# Patient Record
Sex: Female | Born: 1935 | Race: White | Hispanic: No | Marital: Married | Smoking: Never smoker
Health system: Southern US, Community
[De-identification: ages and names within clinical notes are randomized; demographics above are authoritative.]

## PROBLEM LIST (undated history)

## (undated) DIAGNOSIS — C50912 Malignant neoplasm of unspecified site of left female breast: Secondary | ICD-10-CM

## (undated) DIAGNOSIS — C50919 Malignant neoplasm of unspecified site of unspecified female breast: Secondary | ICD-10-CM

## (undated) DIAGNOSIS — D0590 Unspecified type of carcinoma in situ of unspecified breast: Secondary | ICD-10-CM

## (undated) DIAGNOSIS — K219 Gastro-esophageal reflux disease without esophagitis: Secondary | ICD-10-CM

## (undated) DIAGNOSIS — C4492 Squamous cell carcinoma of skin, unspecified: Secondary | ICD-10-CM

## (undated) DIAGNOSIS — K449 Diaphragmatic hernia without obstruction or gangrene: Secondary | ICD-10-CM

## (undated) DIAGNOSIS — G4730 Sleep apnea, unspecified: Secondary | ICD-10-CM

## (undated) DIAGNOSIS — D649 Anemia, unspecified: Secondary | ICD-10-CM

## (undated) DIAGNOSIS — R600 Localized edema: Secondary | ICD-10-CM

## (undated) DIAGNOSIS — J302 Other seasonal allergic rhinitis: Secondary | ICD-10-CM

## (undated) DIAGNOSIS — M25569 Pain in unspecified knee: Secondary | ICD-10-CM

## (undated) DIAGNOSIS — Z9221 Personal history of antineoplastic chemotherapy: Secondary | ICD-10-CM

## (undated) DIAGNOSIS — C50911 Malignant neoplasm of unspecified site of right female breast: Secondary | ICD-10-CM

## (undated) DIAGNOSIS — H04203 Unspecified epiphora, bilateral lacrimal glands: Secondary | ICD-10-CM

## (undated) DIAGNOSIS — S3992XA Unspecified injury of lower back, initial encounter: Secondary | ICD-10-CM

## (undated) DIAGNOSIS — Z923 Personal history of irradiation: Secondary | ICD-10-CM

## (undated) HISTORY — DX: Pain in unspecified knee: M25.569

## (undated) HISTORY — DX: Unspecified injury of lower back, initial encounter: S39.92XA

## (undated) HISTORY — DX: Unspecified type of carcinoma in situ of unspecified breast: D05.90

## (undated) HISTORY — DX: Diaphragmatic hernia without obstruction or gangrene: K44.9

## (undated) HISTORY — DX: Gastro-esophageal reflux disease without esophagitis: K21.9

## (undated) HISTORY — DX: Anemia, unspecified: D64.9

## (undated) HISTORY — PX: COLONOSCOPY: SHX174

## (undated) HISTORY — PX: APPENDECTOMY: SHX54

## (undated) HISTORY — DX: Malignant neoplasm of unspecified site of unspecified female breast: C50.919

## (undated) HISTORY — DX: Other seasonal allergic rhinitis: J30.2

## (undated) HISTORY — DX: Sleep apnea, unspecified: G47.30

---

## 2005-03-18 ENCOUNTER — Ambulatory Visit: Payer: Self-pay | Admitting: Obstetrics and Gynecology

## 2005-07-25 ENCOUNTER — Ambulatory Visit: Payer: Self-pay | Admitting: Gastroenterology

## 2006-04-04 ENCOUNTER — Ambulatory Visit: Payer: Self-pay | Admitting: Obstetrics and Gynecology

## 2007-03-26 ENCOUNTER — Emergency Department: Payer: Self-pay | Admitting: Emergency Medicine

## 2007-03-29 ENCOUNTER — Emergency Department: Payer: Self-pay | Admitting: Unknown Physician Specialty

## 2007-04-02 ENCOUNTER — Emergency Department: Payer: Self-pay | Admitting: Emergency Medicine

## 2007-04-09 ENCOUNTER — Emergency Department: Payer: Self-pay | Admitting: Emergency Medicine

## 2007-04-23 ENCOUNTER — Emergency Department: Payer: Self-pay | Admitting: Emergency Medicine

## 2007-04-30 ENCOUNTER — Ambulatory Visit: Payer: Self-pay | Admitting: Obstetrics and Gynecology

## 2008-04-30 ENCOUNTER — Ambulatory Visit: Payer: Self-pay | Admitting: Obstetrics and Gynecology

## 2009-05-02 DIAGNOSIS — C4492 Squamous cell carcinoma of skin, unspecified: Secondary | ICD-10-CM

## 2009-05-02 HISTORY — DX: Squamous cell carcinoma of skin, unspecified: C44.92

## 2009-12-28 ENCOUNTER — Ambulatory Visit: Payer: Self-pay | Admitting: Family Medicine

## 2010-05-02 HISTORY — PX: CATARACT EXTRACTION, BILATERAL: SHX1313

## 2011-05-04 LAB — BASIC METABOLIC PANEL
Anion Gap: 10 (ref 7–16)
BUN: 20 mg/dL — ABNORMAL HIGH (ref 7–18)
Chloride: 107 mmol/L (ref 98–107)
Co2: 26 mmol/L (ref 21–32)
Creatinine: 0.74 mg/dL (ref 0.60–1.30)
EGFR (African American): >60
Potassium: 4.2 mmol/L (ref 3.5–5.1)

## 2011-05-04 LAB — CK TOTAL AND CKMB (NOT AT ARMC)
CK, Total: 94 U/L (ref 21–215)
CK-MB: 1.4 ng/mL (ref 0.5–3.6)

## 2011-05-04 LAB — CBC
MCHC: 30.3 g/dL — ABNORMAL LOW (ref 32.0–36.0)
Platelet: 357 10*3/uL (ref 150–440)
RDW: 18.1 % — ABNORMAL HIGH (ref 11.5–14.5)
WBC: 6.7 10*3/uL (ref 3.6–11.0)

## 2011-05-04 LAB — TROPONIN I: Troponin-I: <0.02 ng/mL

## 2011-05-05 ENCOUNTER — Inpatient Hospital Stay: Payer: Self-pay | Admitting: Internal Medicine

## 2011-05-05 LAB — CBC WITH DIFFERENTIAL/PLATELET
Basophil #: 0 10*3/uL (ref 0.0–0.1)
Basophil %: 0.1 %
Eosinophil #: 0.2 10*3/uL (ref 0.0–0.7)
Eosinophil %: 3 %
HCT: 26.4 % — ABNORMAL LOW (ref 35.0–47.0)
HGB: 8 g/dL — ABNORMAL LOW (ref 12.0–16.0)
Lymphocyte %: 40 %
MCH: 21.6 pg — ABNORMAL LOW (ref 26.0–34.0)
MCHC: 30.5 g/dL — ABNORMAL LOW (ref 32.0–36.0)
MCV: 71 fL — ABNORMAL LOW (ref 80–100)
Monocyte #: 0.8 10*3/uL — ABNORMAL HIGH (ref 0.0–0.7)
Neutrophil %: 45.5 %
RBC: 3.73 10*6/uL — ABNORMAL LOW (ref 3.80–5.20)

## 2011-05-05 LAB — BASIC METABOLIC PANEL
Anion Gap: 10 (ref 7–16)
Calcium, Total: 9 mg/dL (ref 8.5–10.1)
Chloride: 110 mmol/L — ABNORMAL HIGH (ref 98–107)
Creatinine: 0.79 mg/dL (ref 0.60–1.30)
EGFR (African American): >60
EGFR (Non-African Amer.): >60
Glucose: 121 mg/dL — ABNORMAL HIGH (ref 65–99)
Osmolality: 293 (ref 275–301)
Sodium: 145 mmol/L (ref 136–145)

## 2011-05-05 LAB — IRON AND TIBC
Iron Bind.Cap.(Total): 520 ug/dL — ABNORMAL HIGH (ref 250–450)
Iron Saturation: 3 %
Iron: 14 ug/dL — ABNORMAL LOW (ref 50–170)
Unbound Iron-Bind.Cap.: 506 ug/dL

## 2011-05-06 LAB — CBC WITH DIFFERENTIAL/PLATELET
Basophil #: 0 10*3/uL (ref 0.0–0.1)
HCT: 26.3 % — ABNORMAL LOW (ref 35.0–47.0)
Lymphocyte #: 3.5 10*3/uL (ref 1.0–3.6)
Lymphocyte %: 47.8 %
MCHC: 30.7 g/dL — ABNORMAL LOW (ref 32.0–36.0)
Monocyte #: 0.7 10*3/uL (ref 0.0–0.7)
Neutrophil %: 38.9 %
RDW: 19.2 % — ABNORMAL HIGH (ref 11.5–14.5)

## 2011-05-06 LAB — PROTIME-INR
INR: 1
Prothrombin Time: 12.8 secs (ref 11.5–14.7)

## 2011-05-24 ENCOUNTER — Ambulatory Visit: Payer: Self-pay | Admitting: Unknown Physician Specialty

## 2012-07-18 ENCOUNTER — Ambulatory Visit: Payer: Self-pay | Admitting: Obstetrics and Gynecology

## 2013-02-01 ENCOUNTER — Ambulatory Visit: Payer: Self-pay | Admitting: Specialist

## 2013-07-19 ENCOUNTER — Ambulatory Visit: Payer: Self-pay | Admitting: Obstetrics and Gynecology

## 2014-08-24 NOTE — Consult Note (Signed)
Pt with hugh hiatal hernia with erythematous coarse mucosa where the folds come together at the diaphragm, also superficial old gastritis streaks in antrum. Bx done Will go home and come to office Monday to schedule colonoscopy soon.  Soft diet recommended.  Electronic Signatures: Manya Silvas (MD)  (Signed on 04-Jan-13 16:16)  Authored  Last Updated: 04-Jan-13 16:16 by Manya Silvas (MD)

## 2014-08-24 NOTE — Consult Note (Signed)
PATIENT NAME:  Stephanie Crosby, Stephanie Crosby MR#:  [id number] DATE OF BIRTH:  09/20/1935  DATE OF CONSULTATION:  05/05/2011  REFERRING PHYSICIAN:  Dustin Flock, MD   CONSULTING PHYSICIAN: Gaylyn Cheers, MD/River Mckercher Jerelene Redden, ANP   REASON FOR CONSULTATION: Anemia with guaiac-positive stool.   HISTORY OF PRESENT ILLNESS: This 79 year old patient of Dr. Kary Kos has noted gradual worsening DOE over the last six months. It has become extremely symptomatic in the last few weeks, and the patient was found to have a hemoglobin of 11.01 Sep 2009, and a hemoglobin of 7.7 on 12/28 2012. She did have a stress test done yesterday, and repeat hemoglobin was 7.5 with iron deficiency.  She was sent to the hospital for a blood transfusion and further evaluation. The patient has not heard back yet from the stress test report but has noted no problems with chest pain. She reports dark brown stool occasionally, never black, or with melena. She has noted chronic mild heartburn easily managed with Prilosec. She has noted no progression of symptoms, difficulty swallowing, nausea, vomiting, abdominal pain or change in bowel habits. 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 She has consumed occasional Aleve for three days in a row and drinks wine with dinner. No history of ulcers. In the ER, the patient was found to have dark brown guaiac-positive stool. Prior GI studies have included a colonoscopy in 2007 for screening purposes, and it was totally normal. She had an upper endoscopy for heartburn in 2007 notable for a large hiatus hernia, normal esophagus, normal duodenum. She has been maintained on chronic Prilosec therapy. GI is asked to see the patient regarding further evaluation and management.   See addenum next page. Phone line disconnected during the dication.      ____________________________ Janalyn Harder Jerelene Redden, ANP kam:cbb D: 05/05/2011 15:08:26 ET T: 05/05/2011 15:50:53 ET JOB#: [id number]  cc: Joelene Millin A. Jerelene Redden, ANP, <Dictator> Janalyn Harder. Sherlyn Hay, MSN, ANP-BC Adult Nurse Practitioner ELECTRONICALLY SIGNED 05/05/2011 17:49

## 2014-08-24 NOTE — Discharge Summary (Signed)
PATIENT NAME:  Stephanie Crosby, Stephanie Crosby MR#:  [id number] DATE OF BIRTH:  03-02-1936  DATE OF ADMISSION:  05/05/2011 DATE OF DISCHARGE:  05/06/2011  DISCHARGE DIAGNOSES:  1. Iron deficiency anemia status post one unit of blood transfusion. 2. Gastroesophageal reflux disease.  3. Osteoarthritis. 4. Large hiatal hernia.   DISPOSITION: The patient is being discharged home.   DISCHARGE FOLLOWUP: She will followup with Dr. Gaylyn Cheers on Monday, 05/09/2011, around 10 o'clock  a.m.   DIET: Soft diet.   DISCHARGE MEDICATIONS:  1. Prilosec 20 mg twice a day. 2. Ferrous sulfate 325 mg three times daily.  PROCEDURES: Upper GI endoscopy: Large hiatal hernia with erythematous coarse mucosa within the folds as well as some gastritis in the antrum.   RESULTS: Hemoglobin 8 with microcytosis. Normal white count and platelet count. Essentially normal CMP. Iron studies consistent with iron deficiency anemia with low iron, increased iron binding capacity and low ferritin.   CONSULTANTS: Gaylyn Cheers, MD - Gastroenterology.  HOSPITAL COURSE: The patient is a 79 year old female with past medical history of gastroesophageal reflux disease, hiatal hernia, and osteoarthritis who was admitted with dyspnea on exertion. For additional details, please see the history of present illness. The patient was found to have symptomatic microcytic anemia. She received 1 unit of blood transfusion while in the hospital and her hemoglobin is 8.1 by the time of discharge. Since she was hemodynamically stable, she was not given any further transfusion. Iron studies were checked which were consistent with anemia of iron deficiency. She has been started on oral iron supplementation. She was guaiac positive, therefore, a Gastroenterology consultation was obtained. The patient had a prior EGD and endoscopy done in 2007, which were normal except for hiatal hernia. Dr. Vira Agar evaluated the patient and recommended inpatient EGD. The EGD  showed a large hiatal hernia with erythematous mucosa and gastritis in the antrum. The patient is going to be discharged home. She will take PPI and oral iron therapy. She will follow up with Dr. Vira Agar on Monday, 05/09/2011, for further plans for outpatient colonoscopy. The patient is being discharged home in stable condition.   TIME SPENT: 45 minutes.  ____________________________ Cherre Huger, MD sp:slb D: 05/06/2011 16:39:21 ET T: 05/09/2011 10:45:39 ET JOB#: [id number]  cc: Cherre Huger, MD, <Dictator> Manya Silvas, MD Cherre Huger MD ELECTRONICALLY SIGNED 05/09/2011 12:17

## 2014-08-24 NOTE — Consult Note (Signed)
PATIENT NAME:  Stephanie Crosby, Stephanie Crosby MR#:  [id number] DATE OF BIRTH:  January 25, 1936  DATE OF CONSULTATION:  05/05/2011  REFERRING PHYSICIAN:   CONSULTING PHYSICIAN:  Joelene Millin A. Jerelene Redden, ANP   PAST MEDICAL HISTORY:  1. Gastroesophageal reflux disease.  2. Osteoarthritis.  3. Seasonal allergies.  4. Fibrocystic breast disease.   PAST SURGICAL HISTORY:  1. Childbirth 1962. 2. Appendectomy in 1967.   MEDICATIONS:  1. Tylenol arthritis twice daily as needed.  2. Premarin vaginal cream once daily.  3. Nexium 40 mg daily.  4. Claritin-D 1 tablet daily.  5. Centrum Silver 1 tablet daily.  6. Calcium 600 mg twice daily. 7.  Aleve bid up to three days as needed-not daily use   ALLERGIES: Penicillin.   HABITS: Negative tobacco, alcohol, or illicit drug use.   FAMILY HISTORY: Maternal aunt with colon cancer, elderly. No first-degree relative with colorectal polyp or neoplasm.   REVIEW OF SYSTEMS: 10 systems reviewed negative with the exception of progressive dyspnea on exertion, slight ankle swelling. Stress results available and normal.  PHYSICAL EXAMINATION:  VITAL SIGNS: Temperature 97.9, pulse 84, respirations 18, blood pressure 156/79, 93% on room air.   HEENT: Head is normocephalic. Conjunctivae pale pink. Sclerae anicteric. Oral mucosa moist, intact.   NECK: Supple without lymphadenopathy, thyromegaly.   HEART: Heart tones S1, S2 without murmur or gallop.   LUNGS: Clear to auscultation. Respirations are eupneic.   ABDOMEN: Soft, nontender, normal bowel sounds throughout.   RECTAL: Deferred as it was obtained in the ER, reported heme positive stool.   EXTREMITIES: Without edema, cyanosis, clubbing.   NEUROLOGIC: Awake, alert, oriented x3. Range of motion is normal.   PSYCH: Affect and mood within normal.   SKIN: Warm and dry without rash.   LABORATORY, DIAGNOSTIC AND RADIOLOGICAL DATA: Admission blood work: Glucose 103, BUN 20, creatinine 0.74, serum iron is low at 14, TIBC  elevated 520, ferritin low 4, iron saturation 3%, calcium 9.5. CPK total and troponin negative. Hemoglobin 8.0, hematocrit 26.3, platelet count 357, MCV 69, MCH 21, RDW 18.1.   Repeat laboratory studies: Glucose 121, BUN 20, creatinine 0.79, hemoglobin 8, hematocrit 26.4 per labs 05/05/2011 at 02:13.   IMPRESSION: Progressive dyspnea on exertion, found to have severely low hemoglobin and heme-positive stool with Iron deficiency. Patient received 1 unit PRBC, feels better. Conjuctiva pink. Negative stresss test yesterday. Some Aleve use, and almost daily glass of wine with dinner.  No GI complaints. History of chronic heartburn, large hiatal hernia per EGD 2007 and she has been maintained on chronic Nexium therapy.    PLAN:  1. A luminal evaluation per Dr. Vira Agar. He advises  EGD in the morning to r/o ulcer/NSAID inducded gastritis/duodenitis/Cameron erosions from [facility]. If negatice, colonoscopy can be done as outpatient. Capsule endoscopy if colonoscopy is also unremarkable.  2. Would avoid oral iron at this time so we can do studies. Would repeat CBC to check response of 1 unit PRBC. Will also check PT/INR, and PTT for baseline stat in am  3. Continue with b.i.d. proton pump inhibitor therapy. Would hold off diet until plans for luminal evaluation arranged. OK to have full liquids this eve.  4. Case d/w Dr. Vira Agar and he saw patient and directed care.   These services provided by Joelene Millin A. Jerelene Redden, MS, APRN, BC, ANP under collaborative agreement with Dr. Gaylyn Cheers.   ____________________________ Janalyn Harder. Jerelene Redden, ANP kam:cms D: 05/05/2011 15:16:31 ET T: 05/05/2011 16:03:48 ET JOB#: [id number]  cc: Joelene Millin A. Jerelene Redden, ANP, <Dictator> Joelene Millin  A. Sherlyn Hay, MSN, ANP-BC Adult Nurse Practitioner ELECTRONICALLY SIGNED 05/05/2011 17:48

## 2014-08-24 NOTE — H&P (Signed)
PATIENT NAME:  Stephanie Crosby, Stephanie Crosby MR#:  [id number] DATE OF BIRTH:  03/30/1936  DATE OF ADMISSION:  05/04/2011  PRIMARY CARE PHYSICIAN: [place] Pink, MD  CHIEF COMPLAINT:  Dyspnea on exertion with anemia.   HISTORY OF PRESENT ILLNESS: The patient is a 79 year old white female who has history of gastroesophageal reflux disease and osteoarthritis who has been having shortness of breath with exertion. Therefore she was referred to [facility]. She had a stress test today. In the meantime, she had some blood work drawn by her primary MD, by Dr. Kary Kos, which showed her hemoglobin was low so they recommended she have her hemoglobin rechecked while she was having her stress test. She had her hemoglobin checked and it was 7.5. Therefore, she was sent to the ED for further evaluation. In the ED, her hemoglobin was 8. She had a guaiac stool that was checked which was positive and according to the ED physician was dark in color. The patient reports that she has occasional dark color stools depending on what she eats, but she has not noticed any gross blood. She denies any hematemesis, no hematochezia per her recollection. She denies any hematuria or hemoptysis. She does report history of having low iron and being mildly anemic, but she states that her hemoglobin has never been this low. She had an EGD and a colonoscopy done in 2007 which showed no abnormality. The patient does occasionally take Aleve from time to time, not on a daily basis. She otherwise denies any BC Powders or NSAID use. She denies any abdominal pain, nausea, or vomiting. She denies any weight loss or change in appetite. The dyspnea on exertion occurs especially when she climbs up flights of stairs. She has no other symptoms of chest pain.   PAST MEDICAL HISTORY:  1. Gastroesophageal reflux disease.  2. Osteoarthritis.  3. Status post bilateral cataract surgery.  4. Status post appendectomy.   ALLERGIES: Penicillin.       CURRENT MEDICATIONS:  1. Tylenol Arthritis 1 tab p.o. twice a day as needed.  2. Premarin vaginal cream once daily.  3. Nexium 40 mg daily.  4. Claritin-D 1 tab p.o. daily.  5. Centrum Silver 1 tab p.o. daily.  6. Calci-Tab 600 mg 1 tab p.o. twice a day.  SOCIAL HISTORY: She does not drink or smoke, no drugs.   FAMILY HISTORY: Mother and sister with colon cancer.  REVIEW OF SYSTEMS: CONSTITUTIONAL: Denies any fevers. Complains of fatigue and weakness. No pain. No weight loss. No weight gain. EYES: No blurred or double vision. No pain. No redness. No inflammation. No glaucoma. History of cataracts. ENT: No tinnitus. No ear pain. No hearing loss. No allergies, seasonal or year-round. No nasal discharge. No snoring. No postnasal drip. No sinus pain. RESPIRATORY: No cough. No wheezing. No hemoptysis. No dyspnea. No chronic obstructive pulmonary disease. No tuberculosis. No pneumonia. CARDIOVASCULAR: No chest pain. No orthopnea. No edema. No arrhythmia. GI: No nausea. No vomiting. No diarrhea. No abdominal pain. No hematemesis. No melena. No ulcer. Has history of gastroesophageal reflux disease. No irritable bowel syndrome.  No jaundice. No rectal bleeding. No changes in bowel habits. GENITOURINARY: Denies any dysuria, hematuria, renal calculus, frequency, or incontinence. ENDOCRINE: Denies any polydipsia, nocturia, or thyroid problems. HEME/LYMPH: Reports history of mild anemia. She is not sure what her hemoglobin was. No easy bruising, bleeding, or swollen glands. SKIN: No acne. No rash. No change in mole, hair or skin. MUSCULOSKELETAL: Denies pain in the neck, back, or shoulder. NEUROLOGIC:  No numbness. No weakness. No dysarthria. No cerebrovascular accident. No transient ischemic attack. PSYCHIATRIC: No anxiety. No insomnia. No ADD. No OCD.   PHYSICAL EXAMINATION:   VITAL SIGNS: Temperature 98.2, pulse 80, respiratory rate 18, blood pressure 150/74, and oxygen saturation 97% on room air.    GENERAL: The patient is a 79 year old well developed, well nourished female in no acute distress.   HEENT: Head atraumatic, normocephalic. Pupils are equally round and reactive to light and accommodation. Extraocular movements intact. No scleral icterus. No conjunctival pallor. Oropharynx is clear without any exudates.   NECK: No thyromegaly. No carotid bruits.   HEART: Regular rate and rhythm. No murmurs, rubs, clicks, or gallops.   LUNGS: Clear to auscultation bilaterally without any rales, rhonchi, or wheezing.   ABDOMEN: Soft, nontender, and nondistended. Positive bowel sounds x4.   EXTREMITIES: No clubbing, cyanosis, or edema.   NEUROLOGIC: Awake, alert, and oriented x3. No focal deficits.   SKIN: There is no rash.   LYMPHATICS: No lymph nodes are palpable.   MUSCULOSKELETAL: There is no erythema or swelling.   PSYCHIATRIC: She is not anxious or depressed.   LABS: Glucose 103, BUN 20, creatinine 0.74, sodium 143, potassium 4.2, chloride 107, CO2 26, and calcium 9.5. WBC 6.7, hemoglobin 8.0, platelet count 357, and MCV is 69 here.   ASSESSMENT AND PLAN: The patient is a 79 year old white female with history of osteoarthritis and gastroesophageal reflux disease who has been having shortness of breath with exertion for the past 2 to 3 weeks.  1. Symptomatic microcytic anemia, possibly due to gastrointestinal blood loss with guaiac-positive stools: At this time, transfusion has been ordered per the ED physician. The patient has been explained the risks and benefits and has consented. She understands that getting the transfusion will make her shortness of breath improve. We will also have Gastroenterology evaluate the patient for possible esophagogastroduodenoscopy and colonoscopy. Currently she is stable. If her hemoglobin stay stable, it may be done as an outpatient. I will place her on Protonix twice a day. Follow her CBC in the morning. I will try to get iron and ferritin level.   2. Gastroesophageal reflux disease: Proton pump inhibitors twice a day.  3. Osteoarthritis: Tylenol p.r.n.  4. CODE STATUS: FULL CODE. The patient is ambulatory. In light of gastrointestinal bleed, we will not place her on any heparin or Lovenox.   TIME SPENT: 35 minutes. ____________________________ Lafonda Mosses Posey Pronto, MD shp:slb D: 05/04/2011 21:33:35 ET T: 05/05/2011 08:12:40 ET JOB#: [id number]  cc: Thaer Miyoshi H. Posey Pronto, MD, <Dictator> Irven Easterly. Kary Kos, MD Alric Seton MD ELECTRONICALLY SIGNED 05/07/2011 15:24

## 2014-09-23 ENCOUNTER — Emergency Department
Admission: EM | Admit: 2014-09-23 | Discharge: 2014-09-24 | Disposition: A | Discharge status: Home / Self Care | Payer: Medicare Other | Attending: Emergency Medicine | Admitting: Emergency Medicine

## 2014-09-23 ENCOUNTER — Emergency Department: Payer: Medicare Other

## 2014-09-23 ENCOUNTER — Encounter: Payer: Self-pay | Admitting: Emergency Medicine

## 2014-09-23 DIAGNOSIS — Y93E1 Activity, personal bathing and showering: Secondary | ICD-10-CM | POA: Diagnosis not present

## 2014-09-23 DIAGNOSIS — Y9289 Other specified places as the place of occurrence of the external cause: Secondary | ICD-10-CM | POA: Insufficient documentation

## 2014-09-23 DIAGNOSIS — Y998 Other external cause status: Secondary | ICD-10-CM | POA: Diagnosis not present

## 2014-09-23 DIAGNOSIS — S20229A Contusion of unspecified back wall of thorax, initial encounter: Secondary | ICD-10-CM | POA: Diagnosis not present

## 2014-09-23 DIAGNOSIS — W182XXA Fall in (into) shower or empty bathtub, initial encounter: Secondary | ICD-10-CM | POA: Diagnosis not present

## 2014-09-23 DIAGNOSIS — S3992XA Unspecified injury of lower back, initial encounter: Secondary | ICD-10-CM | POA: Diagnosis present

## 2014-09-23 MED ORDER — OXYCODONE-ACETAMINOPHEN 5-325 MG PO TABS
1.0000 | ORAL_TABLET | Freq: Once | ORAL | Status: AC
Start: 2014-09-23 — End: 2014-09-23
  Administered 2014-09-23: 1 via ORAL

## 2014-09-23 MED ORDER — OXYCODONE-ACETAMINOPHEN 5-325 MG PO TABS
ORAL_TABLET | ORAL | Status: AC
  Administered 2014-09-23: 1 via ORAL
  Filled 2014-09-23: qty 1

## 2014-09-23 NOTE — ED Provider Notes (Signed)
[facility] Emergency Department Provider Note  ____________________________________________  Time seen: 11:15 PM  I have reviewed the triage vital signs and the nursing notes.   HISTORY  Chief Complaint Fall      HPI Stephanie Crosby is a 79 y.o. female presents with history of slip and fall while in shower tonight. Patient currently complains of 8 out of 10 sharp mid upper back pain. Patient states she struck that portion of her back in the shower when she fell. Patient denies any head injury no consciousness. Pain is aggravated with movement improved with remaining still.     History reviewed. No pertinent past medical history.  There are no active problems to display for this patient.   Past Surgical History  Procedure Laterality Date  . Appendectomy      No current outpatient prescriptions on file.  Allergies Review of patient's allergies indicates not on file.  History reviewed. No pertinent family history.  Social History History  Substance Use Topics  . Smoking status: Never Smoker   . Smokeless tobacco: Never Used  . Alcohol Use: Yes     Comment: occasional drinker    Review of Systems  Constitutional: Negative for fever. Eyes: Negative for visual changes. ENT: Negative for sore throat. Cardiovascular: Negative for chest pain. Respiratory: Negative for shortness of breath. Gastrointestinal: Negative for abdominal pain, vomiting and diarrhea. Genitourinary: Negative for dysuria. Musculoskeletal: Positive for back pain. Skin: Negative for rash. Neurological: Negative for headaches, focal weakness or numbness.   10-point ROS otherwise negative.  ____________________________________________   PHYSICAL EXAM:  VITAL SIGNS: ED Triage Vitals  Enc Vitals Group     BP 09/23/14 2318 165/99 mmHg     Pulse Rate 09/23/14 2318 98     Resp 09/23/14 2318 20     Temp 09/23/14 2318 97 F (36.1 C)     Temp Source 09/23/14 2318  Oral     SpO2 09/23/14 2318 95 %     Weight 09/23/14 2318 165 lb (74.844 kg)     Height 09/23/14 2318 5\' 7"  (1.702 m)     Head Cir --      Peak Flow --      Pain Score 09/23/14 2319 8     Pain Loc --      Pain Edu? --      Excl. in [place]? --      Constitutional: Alert and oriented. Well appearing and in no distress. Eyes: Conjunctivae are normal. PERRL. Normal extraocular movements. ENT   Head: Normocephalic and atraumatic.   Nose: No congestion/rhinnorhea. Cardiovascular: Normal rate, regular rhythm. Normal and symmetric distal pulses are present in all extremities. No murmurs, rubs, or gallops. Respiratory: Normal respiratory effort without tachypnea nor retractions. Breath sounds are clear and equal bilaterally. No wheezes/rales/rhonchi. Gastrointestinal: Soft and nontender. No distention. There is no CVA tenderness. Genitourinary: deferred Musculoskeletal: Nontender with normal range of motion in all extremities. No joint effusions.  No lower extremity tenderness nor edema. Pain with palpation of T4 and T5. Neurologic:  Normal speech and language. No gross focal neurologic deficits are appreciated. Speech is normal.  Skin:  Skin is warm, dry and intact. No rash noted. Psychiatric: Mood and affect are normal. Speech and behavior are normal. Patient exhibits appropriate insight and judgment.  ____________________________________________        RADIOLOGY  Thoracic spine x-ray negative  ____________________________________________     INITIAL IMPRESSION / ASSESSMENT AND PLAN / ED COURSE  Pertinent labs & imaging  results that were available during my care of the patient were reviewed by me and considered in my medical decision making (see chart for details).  Strip physical exam consistent with upper middle back contusion.  ____________________________________________   FINAL CLINICAL IMPRESSION(S) / ED DIAGNOSES  Final diagnoses:  Back contusion, unspecified  laterality, initial encounter      Gregor Hams, MD 09/24/14 2321

## 2014-09-23 NOTE — ED Notes (Addendum)
Pt via ems from home after falling in the shower. Pt states she did not pass out, that she was moving and lost her balance. Pt states she hit her back on the shower seat. Rates pain 8/10. Pt alert & oriented with warm, dry skin.

## 2014-09-24 MED ORDER — OXYCODONE-ACETAMINOPHEN 5-325 MG PO TABS: 1.0000 | ORAL_TABLET | ORAL | Status: DC | PRN

## 2014-09-24 NOTE — Discharge Instructions (Signed)
Contusion °A contusion is a deep bruise. Contusions are the result of an injury that caused bleeding under the skin. The contusion may turn blue, purple, or yellow. Minor injuries will give you a painless contusion, but more severe contusions may stay painful and swollen for a few weeks.  °CAUSES  °A contusion is usually caused by a blow, trauma, or direct force to an area of the body. °SYMPTOMS  °· Swelling and redness of the injured area. °· Bruising of the injured area. °· Tenderness and soreness of the injured area. °· Pain. °DIAGNOSIS  °The diagnosis can be made by taking a history and physical exam. An X-ray, CT scan, or MRI may be needed to determine if there were any associated injuries, such as fractures. °TREATMENT  °Specific treatment will depend on what area of the body was injured. In general, the best treatment for a contusion is resting, icing, elevating, and applying cold compresses to the injured area. Over-the-counter medicines may also be recommended for pain control. Ask your caregiver what the best treatment is for your contusion. °HOME CARE INSTRUCTIONS  °· Put ice on the injured area. °¨ Put ice in a plastic bag. °¨ Place a towel between your skin and the bag. °¨ Leave the ice on for 15-20 minutes, 3-4 times a day, or as directed by your health care provider. °· Only take over-the-counter or prescription medicines for pain, discomfort, or fever as directed by your caregiver. Your caregiver may recommend avoiding anti-inflammatory medicines (aspirin, ibuprofen, and naproxen) for 48 hours because these medicines may increase bruising. °· Rest the injured area. °· If possible, elevate the injured area to reduce swelling. °SEEK IMMEDIATE MEDICAL CARE IF:  °· You have increased bruising or swelling. °· You have pain that is getting worse. °· Your swelling or pain is not relieved with medicines. °MAKE SURE YOU:  °· Understand these instructions. °· Will watch your condition. °· Will get help right  away if you are not doing well or get worse. °Document Released: 01/26/2005 Document Revised: 04/23/2013 Document Reviewed: 02/21/2011 °Exit[employer]® Patient Information ©2015 [employer]. This information is not intended to replace advice given to you by your health care provider. Make sure you discuss any questions you have with your health care provider. ° °

## 2014-10-14 DIAGNOSIS — M47816 Spondylosis without myelopathy or radiculopathy, lumbar region: Secondary | ICD-10-CM | POA: Insufficient documentation

## 2014-10-14 DIAGNOSIS — S239XXA Sprain of unspecified parts of thorax, initial encounter: Secondary | ICD-10-CM | POA: Insufficient documentation

## 2015-03-23 ENCOUNTER — Other Ambulatory Visit: Payer: Self-pay | Admitting: Family Medicine

## 2015-03-23 DIAGNOSIS — Z1231 Encounter for screening mammogram for malignant neoplasm of breast: Secondary | ICD-10-CM

## 2015-04-07 ENCOUNTER — Ambulatory Visit
Admission: RE | Admit: 2015-04-07 | Discharge: 2015-04-07 | Disposition: A | Discharge status: Home / Self Care | Payer: Medicare Other | Source: Ambulatory Visit | Attending: Family Medicine | Admitting: Family Medicine

## 2015-04-07 ENCOUNTER — Other Ambulatory Visit: Payer: Self-pay | Admitting: Family Medicine

## 2015-04-07 DIAGNOSIS — Z1231 Encounter for screening mammogram for malignant neoplasm of breast: Secondary | ICD-10-CM | POA: Diagnosis present

## 2015-04-09 ENCOUNTER — Other Ambulatory Visit: Payer: Self-pay | Admitting: Family Medicine

## 2015-04-09 DIAGNOSIS — R928 Other abnormal and inconclusive findings on diagnostic imaging of breast: Secondary | ICD-10-CM

## 2015-04-15 ENCOUNTER — Other Ambulatory Visit: Payer: Self-pay | Admitting: Family Medicine

## 2015-04-15 DIAGNOSIS — R928 Other abnormal and inconclusive findings on diagnostic imaging of breast: Secondary | ICD-10-CM

## 2015-04-17 ENCOUNTER — Other Ambulatory Visit: Payer: Medicare Other

## 2015-04-17 ENCOUNTER — Ambulatory Visit: Payer: Medicare Other

## 2015-04-28 ENCOUNTER — Ambulatory Visit
Admission: RE | Admit: 2015-04-28 | Discharge: 2015-04-28 | Disposition: A | Discharge status: Home / Self Care | Payer: Medicare Other | Source: Ambulatory Visit | Attending: Family Medicine | Admitting: Family Medicine

## 2015-04-28 DIAGNOSIS — R928 Other abnormal and inconclusive findings on diagnostic imaging of breast: Secondary | ICD-10-CM

## 2015-04-28 DIAGNOSIS — N63 Unspecified lump in breast: Secondary | ICD-10-CM | POA: Insufficient documentation

## 2015-04-29 ENCOUNTER — Other Ambulatory Visit: Payer: Self-pay | Admitting: Family Medicine

## 2015-04-29 DIAGNOSIS — N63 Unspecified lump in unspecified breast: Secondary | ICD-10-CM

## 2015-05-03 DIAGNOSIS — C50919 Malignant neoplasm of unspecified site of unspecified female breast: Secondary | ICD-10-CM

## 2015-05-03 DIAGNOSIS — C50911 Malignant neoplasm of unspecified site of right female breast: Secondary | ICD-10-CM

## 2015-05-03 DIAGNOSIS — C50912 Malignant neoplasm of unspecified site of left female breast: Secondary | ICD-10-CM

## 2015-05-03 DIAGNOSIS — D0590 Unspecified type of carcinoma in situ of unspecified breast: Secondary | ICD-10-CM

## 2015-05-03 HISTORY — DX: Malignant neoplasm of unspecified site of unspecified female breast: C50.919

## 2015-05-03 HISTORY — PX: BREAST LUMPECTOMY: SHX2

## 2015-05-03 HISTORY — DX: Malignant neoplasm of unspecified site of left female breast: C50.912

## 2015-05-03 HISTORY — DX: Unspecified type of carcinoma in situ of unspecified breast: D05.90

## 2015-05-03 HISTORY — DX: Malignant neoplasm of unspecified site of right female breast: C50.911

## 2015-05-06 ENCOUNTER — Ambulatory Visit
Admission: RE | Admit: 2015-05-06 | Discharge: 2015-05-06 | Disposition: A | Discharge status: Home / Self Care | Payer: Medicare Other | Source: Ambulatory Visit | Attending: Family Medicine | Admitting: Family Medicine

## 2015-05-06 DIAGNOSIS — N63 Unspecified lump in unspecified breast: Secondary | ICD-10-CM

## 2015-05-08 LAB — SURGICAL PATHOLOGY

## 2015-05-12 ENCOUNTER — Inpatient Hospital Stay: Payer: Medicare Other | Attending: Oncology | Admitting: Oncology

## 2015-05-12 ENCOUNTER — Other Ambulatory Visit: Payer: Self-pay | Admitting: Oncology

## 2015-05-12 ENCOUNTER — Encounter: Payer: Self-pay | Admitting: *Deleted

## 2015-05-12 VITALS — BP 161/110 | HR 82 | Temp 97.8°F | Resp 18 | Ht 66.54 in | Wt 164.7 lb

## 2015-05-12 DIAGNOSIS — M899 Disorder of bone, unspecified: Secondary | ICD-10-CM | POA: Diagnosis not present

## 2015-05-12 DIAGNOSIS — N6489 Other specified disorders of breast: Secondary | ICD-10-CM | POA: Insufficient documentation

## 2015-05-12 DIAGNOSIS — C50412 Malignant neoplasm of upper-outer quadrant of left female breast: Secondary | ICD-10-CM | POA: Insufficient documentation

## 2015-05-12 DIAGNOSIS — K760 Fatty (change of) liver, not elsewhere classified: Secondary | ICD-10-CM | POA: Insufficient documentation

## 2015-05-12 DIAGNOSIS — E042 Nontoxic multinodular goiter: Secondary | ICD-10-CM | POA: Diagnosis not present

## 2015-05-12 DIAGNOSIS — R030 Elevated blood-pressure reading, without diagnosis of hypertension: Secondary | ICD-10-CM

## 2015-05-12 DIAGNOSIS — K449 Diaphragmatic hernia without obstruction or gangrene: Secondary | ICD-10-CM | POA: Diagnosis not present

## 2015-05-12 DIAGNOSIS — M549 Dorsalgia, unspecified: Secondary | ICD-10-CM | POA: Insufficient documentation

## 2015-05-12 DIAGNOSIS — M4854XA Collapsed vertebra, not elsewhere classified, thoracic region, initial encounter for fracture: Secondary | ICD-10-CM | POA: Diagnosis not present

## 2015-05-12 DIAGNOSIS — C50912 Malignant neoplasm of unspecified site of left female breast: Secondary | ICD-10-CM

## 2015-05-12 DIAGNOSIS — Z170 Estrogen receptor positive status [ER+]: Secondary | ICD-10-CM | POA: Diagnosis not present

## 2015-05-12 DIAGNOSIS — N281 Cyst of kidney, acquired: Secondary | ICD-10-CM | POA: Insufficient documentation

## 2015-05-12 DIAGNOSIS — Z79899 Other long term (current) drug therapy: Secondary | ICD-10-CM | POA: Diagnosis not present

## 2015-05-12 DIAGNOSIS — F419 Anxiety disorder, unspecified: Secondary | ICD-10-CM | POA: Diagnosis not present

## 2015-05-12 DIAGNOSIS — R911 Solitary pulmonary nodule: Secondary | ICD-10-CM | POA: Insufficient documentation

## 2015-05-12 DIAGNOSIS — Z85828 Personal history of other malignant neoplasm of skin: Secondary | ICD-10-CM

## 2015-05-12 NOTE — Patient Instructions (Signed)

## 2015-05-12 NOTE — Progress Notes (Signed)
  Oncology Nurse Navigator Documentation  Navigator Location: [facility]Med Onc (05/12/15 1300) Navigator Encounter Type: Initial MedOnc (05/12/15 1300)   Abnormal Finding Date: 04/28/15 (05/12/15 1300) Confirmed Diagnosis Date: 05/07/15 (05/12/15 1300)     Patient Visit Type: MedOnc (05/12/15 1300)   Barriers/Navigation Needs: Education (05/12/15 1300) Education: Newly Diagnosed Cancer Education (05/12/15 1300)    Met patient and her husband today during her initial medical oncology consultation. Gave patient breast cancer educational literature, "My Breast Cancer Treatment Handbook" by Josephine Igo, RN.  Reviewed port a cath information.  Offered support.  They are to call if they have any questions or needs, or if they do not have a surgical appointment by Friday.                    Time Spent with Patient: 90 (05/12/15 1300)

## 2015-05-12 NOTE — Progress Notes (Signed)
Patient is anxious today and her BP is elevated.  She reports that her bp is borderline high and increases more at doctor appointments.

## 2015-05-13 NOTE — Progress Notes (Signed)
[place]  [phone]) [phone] Fax:(336) [phone]  ID: [name] OB: 1935/10/26  MR#: [id number]  KZL#:[id number]  [name], MD as PCP - General (Family Medicine)  CHIEF COMPLAINT:  Chief Complaint  Patient presents with  . New Evaluation    abnormal mammogram and biopsy    INTERVAL HISTORY: Patient is a 80 year old female who was noted to have an abnormality on routine screening mammogram. Subsequent biopsy confirmed invasive lobular carcinoma. Currently, she is highly anxious but otherwise feels well. She has no neurologic complaints. She denies any pain. She has a good appetite and denies weight loss. She has no chest pain or shortness of breath. She denies any nausea, vomiting, constipation, or diarrhea. She has no urinary complaints. Patient otherwise feels well and offers no further specific complaints.  REVIEW OF SYSTEMS:   Review of Systems  Constitutional: Negative for fever, weight loss and malaise/fatigue.  Respiratory: Negative.   Cardiovascular: Negative.   Musculoskeletal: Negative.   Neurological: Negative.   Psychiatric/Behavioral: The patient is nervous/anxious.     As per HPI. Otherwise, a complete review of systems is negatve.  PAST MEDICAL HISTORY: Past Medical History  Diagnosis Date  . Skin cancer 2011    PAST SURGICAL HISTORY: Past Surgical History  Procedure Laterality Date  . Appendectomy      FAMILY HISTORY: Patient reports a maternal aunt with breast cancer as well as a female cousin with breast cancer.  Family History  Problem Relation Age of Onset  . Breast cancer Neg Hx        ADVANCED DIRECTIVES:    HEALTH MAINTENANCE: Social History  Substance Use Topics  . Smoking status: Never Smoker   . Smokeless tobacco: Never Used  . Alcohol Use: Yes     Comment: occasional drinker     Colonoscopy:  PAP:  Bone density:  Lipid panel:  Allergies  Allergen Reactions  .  Penicillins Rash    Current Outpatient Prescriptions  Medication Sig Dispense Refill  . CALCIUM PO Take by mouth.    . ferrous fumarate (HEMOCYTE - 106 MG FE) 325 (106 Fe) MG TABS tablet Take 1 tablet by mouth.    . Multiple Vitamins-Minerals (CENTRUM SILVER PO) Take by mouth.    Marland Kitchen omeprazole (PRILOSEC) 20 MG capsule Take 1 capsule by mouth daily.    . valACYclovir (VALTREX) 500 MG tablet Take by mouth.    . oxyCODONE-acetaminophen (PERCOCET/ROXICET) 5-325 MG per tablet Take 1 tablet by mouth every 4 (four) hours as needed for severe pain. (Patient not taking: Reported on 05/12/2015) 20 tablet 0   No current facility-administered medications for this visit.    OBJECTIVE: Filed Vitals:   05/12/15 1021  BP: 161/110  Pulse: 82  Temp: 97.8 F (36.6 C)  Resp: 18     Body mass index is 26.15 kg/(m^2).    ECOG FS:0 - Asymptomatic  General: Well-developed, well-nourished, no acute distress. Eyes: Pink conjunctiva, anicteric sclera. HEENT: Normocephalic, moist mucous membranes, clear oropharnyx. Breasts: Patient requested exam be deferred today. Lungs: Clear to auscultation bilaterally. Heart: Regular rate and rhythm. No rubs, murmurs, or gallops. Abdomen: Soft, nontender, nondistended. No organomegaly noted, normoactive bowel sounds. Musculoskeletal: No edema, cyanosis, or clubbing. Neuro: Alert, answering all questions appropriately. Cranial nerves grossly intact. Skin: No rashes or petechiae noted. Psych: Normal affect. Lymphatics: No cervical, calvicular, axillary or inguinal LAD.   LAB RESULTS:  Lab Results  Component Value Date   NA 145 05/05/2011   K  4.0 05/05/2011   CL 110* 05/05/2011   CO2 25 05/05/2011   GLUCOSE 121* 05/05/2011   BUN 20* 05/05/2011   CREATININE 0.79 05/05/2011   CALCIUM 9.0 05/05/2011   GFRNONAA >60 05/05/2011   GFRAA >60 05/05/2011    Lab Results  Component Value Date   WBC 7.3 05/06/2011   NEUTROABS 2.8 05/06/2011   HGB 8.1* 05/06/2011    HCT 26.3* 05/06/2011   MCV 71* 05/06/2011   PLT 280 05/06/2011     STUDIES: Mm Digital Diagnostic Unilat L  05/06/2015  CLINICAL DATA:  Patient status post ultrasound-guided core needle biopsy left breast mass. EXAM: DIAGNOSTIC LEFT MAMMOGRAM POST ULTRASOUND BIOPSY COMPARISON:  Previous exam(s). FINDINGS: Mammographic images were obtained following ultrasound guided biopsy of left breast mass, 2 o'clock position. Wing shaped marking clip in appropriate position IMPRESSION: Appropriate position wing shaped marking clip status post ultrasound-guided core needle biopsy. Final Assessment: Post Procedure Mammograms for Marker Placement Electronically Signed   By: Lovey Newcomer M.D.   On: 05/06/2015 13:41   US Breast Ltd Uni Left Inc Axilla  04/28/2015  CLINICAL DATA:  Patient recalled from screening for left breast architectural distortion. EXAM: DIGITAL DIAGNOSTIC LEFT MAMMOGRAM WITH 3D TOMOSYNTHESIS ULTRASOUND LEFT BREAST COMPARISON:  Previous exam(s). ACR Breast Density Category c: The breast tissue is heterogeneously dense, which may obscure small masses. FINDINGS: Within the upper-outer left breast middle depth there is persistent architectural distortion identified on spot compression CC and MLO tomosynthesis images. On physical exam, I palpate no discrete mass within the upper-outer left breast. Targeted ultrasound is performed, showing a 1.3 x 0.8 x 0.6 cm irregular hypoechoic mass with associated architectural distortion within the left breast 2 o'clock position 2 cm from the nipple, corresponding with mammographic abnormality. There is internal color vascular flow. No left axillary adenopathy identified. IMPRESSION: Suspicious left breast mass with associated distortion RECOMMENDATION: Ultrasound-guided core needle biopsy left breast mass. Biopsy will be scheduled at patient's convenience. I have discussed the findings and recommendations with the patient. Results were also provided in writing at the  conclusion of the visit. If applicable, a reminder letter will be sent to the patient regarding the next appointment. BI-RADS CATEGORY  4: Suspicious. Electronically Signed   By: Lovey Newcomer M.D.   On: 04/28/2015 10:46   Mm Diag Breast Tomo Uni Left  04/28/2015  CLINICAL DATA:  Patient recalled from screening for left breast architectural distortion. EXAM: DIGITAL DIAGNOSTIC LEFT MAMMOGRAM WITH 3D TOMOSYNTHESIS ULTRASOUND LEFT BREAST COMPARISON:  Previous exam(s). ACR Breast Density Category c: The breast tissue is heterogeneously dense, which may obscure small masses. FINDINGS: Within the upper-outer left breast middle depth there is persistent architectural distortion identified on spot compression CC and MLO tomosynthesis images. On physical exam, I palpate no discrete mass within the upper-outer left breast. Targeted ultrasound is performed, showing a 1.3 x 0.8 x 0.6 cm irregular hypoechoic mass with associated architectural distortion within the left breast 2 o'clock position 2 cm from the nipple, corresponding with mammographic abnormality. There is internal color vascular flow. No left axillary adenopathy identified. IMPRESSION: Suspicious left breast mass with associated distortion RECOMMENDATION: Ultrasound-guided core needle biopsy left breast mass. Biopsy will be scheduled at patient's convenience. I have discussed the findings and recommendations with the patient. Results were also provided in writing at the conclusion of the visit. If applicable, a reminder letter will be sent to the patient regarding the next appointment. BI-RADS CATEGORY  4: Suspicious. Electronically Signed   By: Dian Situ  Rosana Hoes M.D.   On: 04/28/2015 10:46   [place] Lt Breast Bx W Loc Dev 1st Lesion Img Bx Spec US Guide  05/08/2015  ADDENDUM REPORT: 05/08/2015 13:17 ADDENDUM: Pathology of the left breast biopsy revealed INVASIVE LOBULAR CARCINOMA. PRELIMINARY GRADE: 2. LOBULAR CARCINOMA IN SITU. Comment: These findings were communicated  to [facility] in Dr. Barbarann Ehlers office on 05/07/2015 by the pathologist. This was found to be concordant by Dr. Rosana Hoes. Recommendations:  Recommend surgical and oncology referral. Jetta Lout, [place] [facility]) contacted the patient on 05/09/15 for a post biopsy check. The patient stated she had bleeding following the biopsy, but it has stopped now. She also has a bruise, but no palpable hematoma. Post biopsy instructions were reviewed with the patient. All of her questions were answered. She was encouraged to call the [place] [facility] with any further questions. The patient stated she has been notified by Dr. Kary Kos of the results. She has an appointment with an oncologist at [facility] on Tuesday, May 12, 2015. Addendum by Jetta Lout, RRA on 05/09/15. Electronically Signed   By: Lovey Newcomer M.D.   On: 05/08/2015 13:17  05/08/2015  CLINICAL DATA:  Patient with suspicious left breast mass. EXAM: ULTRASOUND GUIDED LEFT BREAST CORE NEEDLE BIOPSY COMPARISON:  Previous exam(s). PROCEDURE: I met with the patient and we discussed the procedure of ultrasound-guided biopsy, including benefits and alternatives. We discussed the high likelihood of a successful procedure. We discussed the risks of the procedure including infection, bleeding, tissue injury, clip migration, and inadequate sampling. Informed written consent was given. The usual time-out protocol was performed immediately prior to the procedure. Using sterile technique and 2% Lidocaine as local anesthetic, under direct ultrasound visualization, a 12 gauge vacuum-assisted device was used to perform biopsy of left breast mass 2 o'clock positionusing a lateral approach. At the conclusion of the procedure, a wing shaped tissue marker clip was deployed into the biopsy cavity. Follow-up 2-view mammogram was performed and dictated separately. IMPRESSION: Ultrasound-guided biopsy of left breast mass 2  o'clock position. No apparent complications. Electronically Signed: By: Lovey Newcomer M.D. On: 05/06/2015 13:42    ASSESSMENT: Clinical stage IA ER positive, PR negative, HER-2 overexpressing invasive lobular cancer of the left breast.  PLAN:    1. Breast cancer: The vast majority of invasive lobular breast cancers are HER-2 negative. Case discussed with pathology and patient's final pathology is accurate although unusual. Typically patients with invasive lobular cancer that are HER-2 positive are ER positive and PR negative as is the case here. Given the fact that she is HER-2 positive, she will benefit from adjuvant chemotherapy likely using Taxotere, carboplatinum, and Herceptin. She will also benefit from an aromatase inhibitor at the conclusion of her treatments. Patient may or may not require XRT depending on the final results of her surgery.  Because patient has lobular cancer, will schedule MRI to assess the full extent of her malignancy. A referral was also given surgery for consideration of lumpectomy or mastectomy. We will also get CT scan of the chest, abdomen, and pelvis for staging purposes.  Patient eventually require a MUGA scan as well as port placement. Patient will return to clinic in 1-2 weeks after her surgery to discuss the results and treatment planning. 2. Elevated blood pressure: Likely secondary to anxiety. Monitor.  Approximately 45 minutes was spent in discussion of which greater than 50% was consultation.  Patient expressed understanding and was in agreement with this plan. She  also understands that She can call clinic at any time with any questions, concerns, or complaints.    Lloyd Huger, MD   05/13/2015 1:19 PM

## 2015-05-18 ENCOUNTER — Other Ambulatory Visit: Payer: Self-pay | Admitting: Oncology

## 2015-05-18 DIAGNOSIS — C50912 Malignant neoplasm of unspecified site of left female breast: Secondary | ICD-10-CM

## 2015-05-19 ENCOUNTER — Ambulatory Visit
Admission: RE | Admit: 2015-05-19 | Discharge: 2015-05-19 | Disposition: A | Discharge status: Home / Self Care | Payer: Medicare Other | Source: Ambulatory Visit | Attending: Oncology | Admitting: Oncology

## 2015-05-19 ENCOUNTER — Encounter: Payer: Self-pay | Admitting: *Deleted

## 2015-05-19 DIAGNOSIS — C50912 Malignant neoplasm of unspecified site of left female breast: Secondary | ICD-10-CM

## 2015-05-19 MED ORDER — GADOBENATE DIMEGLUMINE 529 MG/ML IV SOLN: 15.0000 mL | Freq: Once | INTRAVENOUS | Status: DC | PRN

## 2015-05-19 NOTE — Progress Notes (Signed)
  Oncology Nurse Navigator Documentation  Navigator Location: [facility] (05/19/15 0900) Navigator Encounter Type: Telephone (05/19/15 0900)           Patient Visit Type: Follow-up (05/19/15 0900)   Barriers/Navigation Needs: Coordination of Care (05/19/15 0900)    Called to follow-up with patient today.  States she is having her breast MRI tomorrow and feels she will need something to help relax her, otherwise "my blood pressure goes crazy".  Discussed with Dr. Grayland Ormond and called in 1 mg Ativan po 1 tab 30 minutes prior to her MRI to: [facility] in [place].  She would like to have him call her with results, or have an appointment with him to discuss results prior to her surgical consult.  Discussed with Duwayne Heck, CMA to let me know if he prefers to call or schedule an appointment to see him next week.            Acuity: Level 1 (05/19/15 0900)         Time Spent with Patient: 15 (05/19/15 0900)

## 2015-05-20 ENCOUNTER — Ambulatory Visit
Admission: RE | Admit: 2015-05-20 | Discharge: 2015-05-20 | Disposition: A | Discharge status: Home / Self Care | Payer: Medicare Other | Source: Ambulatory Visit | Attending: Oncology | Admitting: Oncology

## 2015-05-20 DIAGNOSIS — M4854XA Collapsed vertebra, not elsewhere classified, thoracic region, initial encounter for fracture: Secondary | ICD-10-CM | POA: Insufficient documentation

## 2015-05-20 DIAGNOSIS — C50912 Malignant neoplasm of unspecified site of left female breast: Secondary | ICD-10-CM | POA: Diagnosis not present

## 2015-05-20 DIAGNOSIS — M899 Disorder of bone, unspecified: Secondary | ICD-10-CM | POA: Diagnosis not present

## 2015-05-20 DIAGNOSIS — I2510 Atherosclerotic heart disease of native coronary artery without angina pectoris: Secondary | ICD-10-CM | POA: Diagnosis not present

## 2015-05-20 DIAGNOSIS — R911 Solitary pulmonary nodule: Secondary | ICD-10-CM | POA: Insufficient documentation

## 2015-05-20 DIAGNOSIS — I700 Atherosclerosis of aorta: Secondary | ICD-10-CM | POA: Diagnosis not present

## 2015-05-20 DIAGNOSIS — K449 Diaphragmatic hernia without obstruction or gangrene: Secondary | ICD-10-CM | POA: Diagnosis not present

## 2015-05-20 DIAGNOSIS — K760 Fatty (change of) liver, not elsewhere classified: Secondary | ICD-10-CM | POA: Diagnosis not present

## 2015-05-20 MED ORDER — IOHEXOL 300 MG/ML  SOLN
100.0000 mL | Freq: Once | INTRAMUSCULAR | Status: AC | PRN
  Administered 2015-05-20: 100 mL via INTRAVENOUS

## 2015-05-25 ENCOUNTER — Encounter: Payer: Self-pay | Admitting: *Deleted

## 2015-05-25 NOTE — Progress Notes (Signed)
  Oncology Nurse Navigator Documentation  Navigator Location: [facility] (05/25/15 1300) Navigator Encounter Type: Telephone (05/25/15 1300)           Patient Visit Type: Follow-up (05/25/15 1300)   Barriers/Navigation Needs: Coordination of Care (05/25/15 1300)                Acuity: Level 1 (05/25/15 1300)         Time Spent with Patient: 15 (05/25/15 1300)   Called patient to inform her of her appointment tomorrow at 10:15 to discuss her results of her MRI and CT scan.

## 2015-05-26 ENCOUNTER — Inpatient Hospital Stay (HOSPITAL_BASED_OUTPATIENT_CLINIC_OR_DEPARTMENT_OTHER): Payer: Medicare Other | Admitting: Oncology

## 2015-05-26 VITALS — BP 149/98 | HR 93 | Temp 97.3°F | Resp 18 | Wt 166.0 lb

## 2015-05-26 DIAGNOSIS — M4854XA Collapsed vertebra, not elsewhere classified, thoracic region, initial encounter for fracture: Secondary | ICD-10-CM

## 2015-05-26 DIAGNOSIS — Z170 Estrogen receptor positive status [ER+]: Secondary | ICD-10-CM | POA: Diagnosis not present

## 2015-05-26 DIAGNOSIS — F419 Anxiety disorder, unspecified: Secondary | ICD-10-CM | POA: Diagnosis not present

## 2015-05-26 DIAGNOSIS — C50912 Malignant neoplasm of unspecified site of left female breast: Secondary | ICD-10-CM

## 2015-05-26 DIAGNOSIS — Z85828 Personal history of other malignant neoplasm of skin: Secondary | ICD-10-CM

## 2015-05-26 DIAGNOSIS — Z79899 Other long term (current) drug therapy: Secondary | ICD-10-CM

## 2015-05-26 DIAGNOSIS — R030 Elevated blood-pressure reading, without diagnosis of hypertension: Secondary | ICD-10-CM

## 2015-05-26 DIAGNOSIS — C50412 Malignant neoplasm of upper-outer quadrant of left female breast: Secondary | ICD-10-CM | POA: Diagnosis not present

## 2015-05-26 DIAGNOSIS — N6489 Other specified disorders of breast: Secondary | ICD-10-CM

## 2015-05-26 DIAGNOSIS — R911 Solitary pulmonary nodule: Secondary | ICD-10-CM

## 2015-05-26 DIAGNOSIS — N281 Cyst of kidney, acquired: Secondary | ICD-10-CM

## 2015-05-26 DIAGNOSIS — E042 Nontoxic multinodular goiter: Secondary | ICD-10-CM

## 2015-05-26 DIAGNOSIS — M549 Dorsalgia, unspecified: Secondary | ICD-10-CM

## 2015-05-26 DIAGNOSIS — K760 Fatty (change of) liver, not elsewhere classified: Secondary | ICD-10-CM

## 2015-05-26 DIAGNOSIS — K449 Diaphragmatic hernia without obstruction or gangrene: Secondary | ICD-10-CM

## 2015-05-26 DIAGNOSIS — M899 Disorder of bone, unspecified: Secondary | ICD-10-CM

## 2015-05-27 ENCOUNTER — Other Ambulatory Visit: Payer: Self-pay | Admitting: Oncology

## 2015-05-27 DIAGNOSIS — R928 Other abnormal and inconclusive findings on diagnostic imaging of breast: Secondary | ICD-10-CM

## 2015-05-28 NOTE — Patient Instructions (Signed)
Trastuzumab injection for infusion What is this medicine? TRASTUZUMAB (tras TOO zoo mab) is a monoclonal antibody. It is used to treat breast cancer and stomach cancer. This medicine may be used for other purposes; ask your health care provider or pharmacist if you have questions. What should I tell my health care provider before I take this medicine? They need to know if you have any of these conditions: -heart disease -heart failure -infection (especially a virus infection such as chickenpox, cold sores, or herpes) -lung or breathing disease, like asthma -recent or ongoing radiation therapy -an unusual or allergic reaction to trastuzumab, benzyl alcohol, or other medications, foods, dyes, or preservatives -pregnant or trying to get pregnant -breast-feeding How should I use this medicine? This drug is given as an infusion into a vein. It is administered in a hospital or clinic by a specially trained health care professional. Talk to your pediatrician regarding the use of this medicine in children. This medicine is not approved for use in children. Overdosage: If you think you have taken too much of this medicine contact a poison control center or emergency room at once. NOTE: This medicine is only for you. Do not share this medicine with others. What if I miss a dose? It is important not to miss a dose. Call your doctor or health care professional if you are unable to keep an appointment. What may interact with this medicine? -doxorubicin -warfarin This list may not describe all possible interactions. Give your health care provider a list of all the medicines, herbs, non-prescription drugs, or dietary supplements you use. Also tell them if you smoke, drink alcohol, or use illegal drugs. Some items may interact with your medicine. What should I watch for while using this medicine? Visit your doctor for checks on your progress. Report any side effects. Continue your course of treatment even  though you feel ill unless your doctor tells you to stop. Call your doctor or health care professional for advice if you get a fever, chills or sore throat, or other symptoms of a cold or flu. Do not treat yourself. Try to avoid being around people who are sick. You may experience fever, chills and shaking during your first infusion. These effects are usually mild and can be treated with other medicines. Report any side effects during the infusion to your health care professional. Fever and chills usually do not happen with later infusions. Do not become pregnant while taking this medicine or for 7 months after stopping it. Women should inform their doctor if they wish to become pregnant or think they might be pregnant. Women of child-bearing potential will need to have a negative pregnancy test before starting this medicine. There is a potential for serious side effects to an unborn child. Talk to your health care professional or pharmacist for more information. Do not breast-feed an infant while taking this medicine or for 7 months after stopping it. Women must use effective birth control with this medicine. What side effects may I notice from receiving this medicine? Side effects that you should report to your doctor or other health care professional as soon as possible: -breathing difficulties -chest pain or palpitations -cough -dizziness or fainting -fever or chills, sore throat -skin rash, itching or hives -swelling of the legs or ankles -unusually weak or tired Side effects that usually do not require medical attention (report to your doctor or other health care professional if they continue or are bothersome): -loss of appetite -headache -muscle aches -nausea This   list may not describe all possible side effects. Call your doctor for medical advice about side effects. You may report side effects to FDA at 1-800-FDA-1088. Where should I keep my medicine? This drug is given in a hospital  or clinic and will not be stored at home. NOTE: This sheet is a summary. It may not cover all possible information. If you have questions about this medicine, talk to your doctor, pharmacist, or health care provider.    2016, [employer]. (2014-07-25 11:49:32) Pertuzumab injection What is this medicine? PERTUZUMAB (per TOOZ ue mab) is a monoclonal antibody. It is used to treat breast cancer. This medicine may be used for other purposes; ask your health care provider or pharmacist if you have questions. What should I tell my health care provider before I take this medicine? They need to know if you have any of these conditions: -heart disease -heart failure -high blood pressure -history of irregular heart beat -recent or ongoing radiation therapy -an unusual or allergic reaction to pertuzumab, other medicines, foods, dyes, or preservatives -pregnant or trying to get pregnant -breast-feeding How should I use this medicine? This medicine is for infusion into a vein. It is given by a health care professional in a hospital or clinic setting. Talk to your pediatrician regarding the use of this medicine in children. Special care may be needed. Overdosage: If you think you have taken too much of this medicine contact a poison control center or emergency room at once. NOTE: This medicine is only for you. Do not share this medicine with others. What if I miss a dose? It is important not to miss your dose. Call your doctor or health care professional if you are unable to keep an appointment. What may interact with this medicine? Interactions are not expected. Give your health care provider a list of all the medicines, herbs, non-prescription drugs, or dietary supplements you use. Also tell them if you smoke, drink alcohol, or use illegal drugs. Some items may interact with your medicine. This list may not describe all possible interactions. Give your health care provider a list of all  the medicines, herbs, non-prescription drugs, or dietary supplements you use. Also tell them if you smoke, drink alcohol, or use illegal drugs. Some items may interact with your medicine. What should I watch for while using this medicine? Your condition will be monitored carefully while you are receiving this medicine. Report any side effects. Continue your course of treatment even though you feel ill unless your doctor tells you to stop. Do not become pregnant while taking this medicine or for 7 months after stopping it. Women should inform their doctor if they wish to become pregnant or think they might be pregnant. Women of child-bearing potential will need to have a negative pregnancy test before starting this medicine. There is a potential for serious side effects to an unborn child. Talk to your health care professional or pharmacist for more information. Do not breast-feed an infant while taking this medicine or for 7 months after stopping it. Women must use effective birth control with this medicine. Call your doctor or health care professional for advice if you get a fever, chills or sore throat, or other symptoms of a cold or flu. Do not treat yourself. Try to avoid being around people who are sick. You may experience fever, chills, and headache during the infusion. Report any side effects during the infusion to your health care professional. What side effects may I notice from receiving   this medicine? Side effects that you should report to your doctor or health care professional as soon as possible: -breathing problems -chest pain or palpitations -dizziness -feeling faint or lightheaded -fever or chills -skin rash, itching or hives -sore throat -swelling of the face, lips, or tongue -swelling of the legs or ankles -unusually weak or tired Side effects that usually do not require medical attention (Report these to your doctor or health care professional if they continue or are  bothersome.): -diarrhea -hair loss -nausea, vomiting -tiredness This list may not describe all possible side effects. Call your doctor for medical advice about side effects. You may report side effects to FDA at 1-800-FDA-1088. Where should I keep my medicine? This drug is given in a hospital or clinic and will not be stored at home. NOTE: This sheet is a summary. It may not cover all possible information. If you have questions about this medicine, talk to your doctor, pharmacist, or health care provider.    2016, [employer]. (2014-07-25 16:07:57) Docetaxel injection What is this medicine? DOCETAXEL (doe se TAX el) is a chemotherapy drug. It targets fast dividing cells, like cancer cells, and causes these cells to die. This medicine is used to treat many types of cancers like breast cancer, certain stomach cancers, head and neck cancer, lung cancer, and prostate cancer. This medicine may be used for other purposes; ask your health care provider or pharmacist if you have questions. What should I tell my health care provider before I take this medicine? They need to know if you have any of these conditions: -infection (especially a virus infection such as chickenpox, cold sores, or herpes) -liver disease -low blood counts, like low white cell, platelet, or red cell counts -an unusual or allergic reaction to docetaxel, polysorbate 80, other chemotherapy agents, other medicines, foods, dyes, or preservatives -pregnant or trying to get pregnant -breast-feeding How should I use this medicine? This drug is given as an infusion into a vein. It is administered in a hospital or clinic by a specially trained health care professional. Talk to your pediatrician regarding the use of this medicine in children. Special care may be needed. Overdosage: If you think you have taken too much of this medicine contact a poison control center or emergency room at once. NOTE: This medicine is only for  you. Do not share this medicine with others. What if I miss a dose? It is important not to miss your dose. Call your doctor or health care professional if you are unable to keep an appointment. What may interact with this medicine? -cyclosporine -erythromycin -ketoconazole -medicines to increase blood counts like filgrastim, pegfilgrastim, sargramostim -vaccines Talk to your doctor or health care professional before taking any of these medicines: -acetaminophen -aspirin -ibuprofen -ketoprofen -naproxen This list may not describe all possible interactions. Give your health care provider a list of all the medicines, herbs, non-prescription drugs, or dietary supplements you use. Also tell them if you smoke, drink alcohol, or use illegal drugs. Some items may interact with your medicine. What should I watch for while using this medicine? Your condition will be monitored carefully while you are receiving this medicine. You will need important blood work done while you are taking this medicine. This drug may make you feel generally unwell. This is not uncommon, as chemotherapy can affect healthy cells as well as cancer cells. Report any side effects. Continue your course of treatment even though you feel ill unless your doctor tells you to stop. In some   cases, you may be given additional medicines to help with side effects. Follow all directions for their use. Call your doctor or health care professional for advice if you get a fever, chills or sore throat, or other symptoms of a cold or flu. Do not treat yourself. This drug decreases your body's ability to fight infections. Try to avoid being around people who are sick. This medicine may increase your risk to bruise or bleed. Call your doctor or health care professional if you notice any unusual bleeding. This medicine may contain alcohol in the product. You may get drowsy or dizzy. Do not drive, use machinery, or do anything that needs mental  alertness until you know how this medicine affects you. Do not stand or sit up quickly, especially if you are an older patient. This reduces the risk of dizzy or fainting spells. Avoid alcoholic drinks. Do not become pregnant while taking this medicine. Women should inform their doctor if they wish to become pregnant or think they might be pregnant. There is a potential for serious side effects to an unborn child. Talk to your health care professional or pharmacist for more information. Do not breast-feed an infant while taking this medicine. What side effects may I notice from receiving this medicine? Side effects that you should report to your doctor or health care professional as soon as possible: -allergic reactions like skin rash, itching or hives, swelling of the face, lips, or tongue -low blood counts - This drug may decrease the number of white blood cells, red blood cells and platelets. You may be at increased risk for infections and bleeding. -signs of infection - fever or chills, cough, sore throat, pain or difficulty passing urine -signs of decreased platelets or bleeding - bruising, pinpoint red spots on the skin, black, tarry stools, nosebleeds -signs of decreased red blood cells - unusually weak or tired, fainting spells, lightheadedness -breathing problems -fast or irregular heartbeat -low blood pressure -mouth sores -nausea and vomiting -pain, swelling, redness or irritation at the injection site -pain, tingling, numbness in the hands or feet -swelling of the ankle, feet, hands -weight gain Side effects that usually do not require medical attention (report to your prescriber or health care professional if they continue or are bothersome): -bone pain -complete hair loss including hair on your head, underarms, pubic hair, eyebrows, and eyelashes -diarrhea -excessive tearing -changes in the color of fingernails -loosening of the fingernails -nausea -muscle pain -red flush to  skin -sweating -weak or tired This list may not describe all possible side effects. Call your doctor for medical advice about side effects. You may report side effects to FDA at 1-800-FDA-1088. Where should I keep my medicine? This drug is given in a hospital or clinic and will not be stored at home. NOTE: This sheet is a summary. It may not cover all possible information. If you have questions about this medicine, talk to your doctor, pharmacist, or health care provider.    2016, [employer]. (2014-05-05 16:04:57) Carboplatin injection What is this medicine? CARBOPLATIN (KAR boe pla tin) is a chemotherapy drug. It targets fast dividing cells, like cancer cells, and causes these cells to die. This medicine is used to treat ovarian cancer and many other cancers. This medicine may be used for other purposes; ask your health care provider or pharmacist if you have questions. What should I tell my health care provider before I take this medicine? They need to know if you have any of these conditions: -blood disorders -  hearing problems -kidney disease -recent or ongoing radiation therapy -an unusual or allergic reaction to carboplatin, cisplatin, other chemotherapy, other medicines, foods, dyes, or preservatives -pregnant or trying to get pregnant -breast-feeding How should I use this medicine? This drug is usually given as an infusion into a vein. It is administered in a hospital or clinic by a specially trained health care professional. Talk to your pediatrician regarding the use of this medicine in children. Special care may be needed. Overdosage: If you think you have taken too much of this medicine contact a poison control center or emergency room at once. NOTE: This medicine is only for you. Do not share this medicine with others. What if I miss a dose? It is important not to miss a dose. Call your doctor or health care professional if you are unable to keep an  appointment. What may interact with this medicine? -medicines for seizures -medicines to increase blood counts like filgrastim, pegfilgrastim, sargramostim -some antibiotics like amikacin, gentamicin, neomycin, streptomycin, tobramycin -vaccines Talk to your doctor or health care professional before taking any of these medicines: -acetaminophen -aspirin -ibuprofen -ketoprofen -naproxen This list may not describe all possible interactions. Give your health care provider a list of all the medicines, herbs, non-prescription drugs, or dietary supplements you use. Also tell them if you smoke, drink alcohol, or use illegal drugs. Some items may interact with your medicine. What should I watch for while using this medicine? Your condition will be monitored carefully while you are receiving this medicine. You will need important blood work done while you are taking this medicine. This drug may make you feel generally unwell. This is not uncommon, as chemotherapy can affect healthy cells as well as cancer cells. Report any side effects. Continue your course of treatment even though you feel ill unless your doctor tells you to stop. In some cases, you may be given additional medicines to help with side effects. Follow all directions for their use. Call your doctor or health care professional for advice if you get a fever, chills or sore throat, or other symptoms of a cold or flu. Do not treat yourself. This drug decreases your body's ability to fight infections. Try to avoid being around people who are sick. This medicine may increase your risk to bruise or bleed. Call your doctor or health care professional if you notice any unusual bleeding. Be careful brushing and flossing your teeth or using a toothpick because you may get an infection or bleed more easily. If you have any dental work done, tell your dentist you are receiving this medicine. Avoid taking products that contain aspirin, acetaminophen,  ibuprofen, naproxen, or ketoprofen unless instructed by your doctor. These medicines may hide a fever. Do not become pregnant while taking this medicine. Women should inform their doctor if they wish to become pregnant or think they might be pregnant. There is a potential for serious side effects to an unborn child. Talk to your health care professional or pharmacist for more information. Do not breast-feed an infant while taking this medicine. What side effects may I notice from receiving this medicine? Side effects that you should report to your doctor or health care professional as soon as possible: -allergic reactions like skin rash, itching or hives, swelling of the face, lips, or tongue -signs of infection - fever or chills, cough, sore throat, pain or difficulty passing urine -signs of decreased platelets or bleeding - bruising, pinpoint red spots on the skin, black, tarry stools, nosebleeds -signs   of decreased red blood cells - unusually weak or tired, fainting spells, lightheadedness -breathing problems -changes in hearing -changes in vision -chest pain -high blood pressure -low blood counts - This drug may decrease the number of white blood cells, red blood cells and platelets. You may be at increased risk for infections and bleeding. -nausea and vomiting -pain, swelling, redness or irritation at the injection site -pain, tingling, numbness in the hands or feet -problems with balance, talking, walking -trouble passing urine or change in the amount of urine Side effects that usually do not require medical attention (report to your doctor or health care professional if they continue or are bothersome): -hair loss -loss of appetite -metallic taste in the mouth or changes in taste This list may not describe all possible side effects. Call your doctor for medical advice about side effects. You may report side effects to FDA at 1-800-FDA-1088. Where should I keep my medicine? This drug  is given in a hospital or clinic and will not be stored at home. NOTE: This sheet is a summary. It may not cover all possible information. If you have questions about this medicine, talk to your doctor, pharmacist, or health care provider.    2016, [employer]. (2007-07-24 14:38:05)  

## 2015-05-30 DIAGNOSIS — C50812 Malignant neoplasm of overlapping sites of left female breast: Secondary | ICD-10-CM | POA: Insufficient documentation

## 2015-05-30 MED ORDER — PROCHLORPERAZINE MALEATE 10 MG PO TABS: 10.0000 mg | ORAL_TABLET | Freq: Four times a day (QID) | ORAL | Status: DC | PRN

## 2015-05-30 MED ORDER — LIDOCAINE-PRILOCAINE 2.5-2.5 % EX CREA: TOPICAL_CREAM | CUTANEOUS | Status: DC

## 2015-05-30 MED ORDER — ONDANSETRON HCL 8 MG PO TABS: 8.0000 mg | ORAL_TABLET | Freq: Two times a day (BID) | ORAL | Status: DC | PRN

## 2015-05-30 NOTE — Progress Notes (Signed)
[place]  Telephone:([phone] Fax:(336) [phone]  ID: [name] OB: 04-16-1936  MR#: [id number]  WUG#:[id number]  [name], MD as PCP - General (Family Medicine)  CHIEF COMPLAINT:  Chief Complaint  Patient presents with  . Breast Cancer    INTERVAL HISTORY: Patient returns to clinic today to discuss her imaging results and additional treatment planning. She continues to be anxious, but otherwise feels well. She has no neurologic complaints. She denies any pain. She has a good appetite and denies weight loss. She has no chest pain or shortness of breath. She denies any nausea, vomiting, constipation, or diarrhea. She has no urinary complaints. Patient otherwise feels well and offers no further specific complaints.  REVIEW OF SYSTEMS:   Review of Systems  Constitutional: Negative for fever, weight loss and malaise/fatigue.  Respiratory: Negative.   Cardiovascular: Negative.   Musculoskeletal: Negative.   Neurological: Negative.   Psychiatric/Behavioral: The patient is nervous/anxious.     As per HPI. Otherwise, a complete review of systems is negatve.  PAST MEDICAL HISTORY: Past Medical History  Diagnosis Date  . Skin cancer 2011    PAST SURGICAL HISTORY: Past Surgical History  Procedure Laterality Date  . Appendectomy      FAMILY HISTORY: Patient reports a maternal aunt with breast cancer as well as a female cousin with breast cancer.  Family History  Problem Relation Age of Onset  . Breast cancer Neg Hx        ADVANCED DIRECTIVES:    HEALTH MAINTENANCE: Social History  Substance Use Topics  . Smoking status: Never Smoker   . Smokeless tobacco: Never Used  . Alcohol Use: Yes     Comment: occasional drinker     Colonoscopy:  PAP:  Bone density:  Lipid panel:  Allergies  Allergen Reactions  . Penicillins Rash    Current Outpatient Prescriptions  Medication Sig Dispense Refill  . CALCIUM PO  Take by mouth.    . ferrous fumarate (HEMOCYTE - 106 MG FE) 325 (106 Fe) MG TABS tablet Take 1 tablet by mouth.    . Multiple Vitamins-Minerals (CENTRUM SILVER PO) Take by mouth.    Marland Kitchen omeprazole (PRILOSEC) 20 MG capsule Take 1 capsule by mouth daily.    Marland Kitchen oxyCODONE-acetaminophen (PERCOCET/ROXICET) 5-325 MG per tablet Take 1 tablet by mouth every 4 (four) hours as needed for severe pain. 20 tablet 0  . valACYclovir (VALTREX) 500 MG tablet Take by mouth.     No current facility-administered medications for this visit.    OBJECTIVE: Filed Vitals:   05/26/15 1051  BP: 149/98  Pulse: 93  Temp: 97.3 F (36.3 C)  Resp: 18     Body mass index is 26.36 kg/(m^2).    ECOG FS:0 - Asymptomatic  General: Well-developed, well-nourished, no acute distress. Eyes: Pink conjunctiva, anicteric sclera. Breasts: Patient requested exam be deferred today. Lungs: Clear to auscultation bilaterally. Heart: Regular rate and rhythm. No rubs, murmurs, or gallops. Abdomen: Soft, nontender, nondistended. No organomegaly noted, normoactive bowel sounds. Musculoskeletal: No edema, cyanosis, or clubbing. Neuro: Alert, answering all questions appropriately. Cranial nerves grossly intact. Skin: No rashes or petechiae noted. Psych: Normal affect.   LAB RESULTS:  Lab Results  Component Value Date   NA 145 05/05/2011   K 4.0 05/05/2011   CL 110* 05/05/2011   CO2 25 05/05/2011   GLUCOSE 121* 05/05/2011   BUN 20* 05/05/2011   CREATININE 0.79 05/05/2011   CALCIUM 9.0 05/05/2011   GFRNONAA >60  05/05/2011   GFRAA >60 05/05/2011    Lab Results  Component Value Date   WBC 7.3 05/06/2011   NEUTROABS 2.8 05/06/2011   HGB 8.1* 05/06/2011   HCT 26.3* 05/06/2011   MCV 71* 05/06/2011   PLT 280 05/06/2011     STUDIES: Ct Chest W Contrast  05/20/2015  CLINICAL DATA:  New lobular breast cancer, left breast. Back pain since a fall in May 2016. EXAM: CT CHEST, ABDOMEN, AND PELVIS WITH CONTRAST TECHNIQUE:  Multidetector CT imaging of the chest, abdomen and pelvis was performed following the standard protocol during bolus administration of intravenous contrast. CONTRAST:  153m OMNIPAQUE IOHEXOL 300 MG/ML  SOLN COMPARISON:  09/23/2014 FINDINGS: CT CHEST FINDINGS Mediastinum/Nodes: Coronary, aortic arch, and branch vessel atherosclerotic vascular disease. Tortuous branch vasculature. Large hiatal hernia contains most of the stomach. Small subcentimeter thyroid nodules. No pathologically enlarged thoracic adenopathy. Lungs/Pleura: 0.5 by 0.4 cm right lower lobe pulmonary nodule, image 31 series 5. Passive atelectasis medially in both lower lobes due to the hiatal hernia. Musculoskeletal: Thoracic kyphosis. 35% compression fracture of T4, newly apparent compared to 09/23/2014. CT ABDOMEN PELVIS FINDINGS Hepatobiliary: Diffuse hepatic steatosis. Pancreas: Unremarkable Spleen: Unremarkable Adrenals/Urinary Tract: Left renal parapelvic cysts. Adrenal glands normal. Stomach/Bowel: Unremarkable Vascular/Lymphatic: Aortoiliac atherosclerotic vascular disease. No pathologic adenopathy observed in the abdomen or pelvis. Reproductive: Unremarkable Other: No supplemental non-categorized findings. Musculoskeletal: Faintly sclerotic lesion eccentric to the left in the L4 vertebral body on image 75 series 2, fine sclerotic rim, no cortical destruction or extraosseous component. Exaggerated lumbar lordosis. Moderate axial loss of articular space in both hips. IMPRESSION: 1. There is a faintly sclerotic lesion in the left L4 vertebral body with thin rim sclerosis, most likely this is a benign lesion, less likely to be a manifestation of osseous metastatic disease. Consider whole-body bone scan for further reassurance. 2. Coronary, aortic arch, and branch vessel atherosclerotic vascular disease. Aortoiliac atherosclerotic vascular disease. 3. Large hiatal hernia. 4. 5 by 4 mm right lower lobe pulmonary nodule, nonspecific, merits  observation. Next 5. Percent compression fracture of T4, newly apparent compared to 09/23/14. 6. Diffuse hepatic steatosis. Electronically Signed   By: WVan ClinesM.D.   On: 05/20/2015 10:25   Ct Abdomen Pelvis W Contrast  05/20/2015  CLINICAL DATA:  New lobular breast cancer, left breast. Back pain since a fall in May 2016. EXAM: CT CHEST, ABDOMEN, AND PELVIS WITH CONTRAST TECHNIQUE: Multidetector CT imaging of the chest, abdomen and pelvis was performed following the standard protocol during bolus administration of intravenous contrast. CONTRAST:  1082mOMNIPAQUE IOHEXOL 300 MG/ML  SOLN COMPARISON:  09/23/2014 FINDINGS: CT CHEST FINDINGS Mediastinum/Nodes: Coronary, aortic arch, and branch vessel atherosclerotic vascular disease. Tortuous branch vasculature. Large hiatal hernia contains most of the stomach. Small subcentimeter thyroid nodules. No pathologically enlarged thoracic adenopathy. Lungs/Pleura: 0.5 by 0.4 cm right lower lobe pulmonary nodule, image 31 series 5. Passive atelectasis medially in both lower lobes due to the hiatal hernia. Musculoskeletal: Thoracic kyphosis. 35% compression fracture of T4, newly apparent compared to 09/23/2014. CT ABDOMEN PELVIS FINDINGS Hepatobiliary: Diffuse hepatic steatosis. Pancreas: Unremarkable Spleen: Unremarkable Adrenals/Urinary Tract: Left renal parapelvic cysts. Adrenal glands normal. Stomach/Bowel: Unremarkable Vascular/Lymphatic: Aortoiliac atherosclerotic vascular disease. No pathologic adenopathy observed in the abdomen or pelvis. Reproductive: Unremarkable Other: No supplemental non-categorized findings. Musculoskeletal: Faintly sclerotic lesion eccentric to the left in the L4 vertebral body on image 75 series 2, fine sclerotic rim, no cortical destruction or extraosseous component. Exaggerated lumbar lordosis. Moderate axial loss of articular space in  both hips. IMPRESSION: 1. There is a faintly sclerotic lesion in the left L4 vertebral body with  thin rim sclerosis, most likely this is a benign lesion, less likely to be a manifestation of osseous metastatic disease. Consider whole-body bone scan for further reassurance. 2. Coronary, aortic arch, and branch vessel atherosclerotic vascular disease. Aortoiliac atherosclerotic vascular disease. 3. Large hiatal hernia. 4. 5 by 4 mm right lower lobe pulmonary nodule, nonspecific, merits observation. Next 5. Percent compression fracture of T4, newly apparent compared to 09/23/14. 6. Diffuse hepatic steatosis. Electronically Signed   By: Van Clines M.D.   On: 05/20/2015 10:25   Mr Breast Bilateral W Wo Contrast  05/20/2015  CLINICAL DATA:  80 year old female with biopsy proven invasive lobular carcinoma in the left breast. LABS:  Creatinine was obtained on site at [facility] at [address] Results: Creatinine 0.7 mg/dL. EXAM: BILATERAL BREAST MRI WITH AND WITHOUT CONTRAST TECHNIQUE: Multiplanar, multisequence MR images of both breasts were obtained prior to and following the intravenous administration of 15 ml of MultiHance. THREE-DIMENSIONAL MR IMAGE RENDERING ON INDEPENDENT WORKSTATION: Three-dimensional MR images were rendered by post-processing of the original MR data on an independent workstation. The three-dimensional MR images were interpreted, and findings are reported in the following complete MRI report for this study. Three dimensional images were evaluated at the independent DynaCad workstation COMPARISON:  Previous exam(s). FINDINGS: Breast composition: c. Heterogeneous fibroglandular tissue. Background parenchymal enhancement: Moderate. Right breast: There is a 6 x 6 x 6 mm irregular enhancing mass in the upper-outer quadrant of the right breast. It is indeterminate. Left breast: There is asymmetric focal clumped enhancement in the upper and central anterior aspect of the left breast measuring 7.5 x 2.1 x 1.3 cm in anterior posterior, transverse and craniocaudal dimensions.  The enhancement extends to the nipple. Signal void artifact is seen within the enhancement corresponding with the biopsy clip. Lymph nodes: No abnormal appearing lymph nodes. Ancillary findings:  None. IMPRESSION: 1. 7.5 cm enhancing mass in the left breast corresponding with the biopsy proven invasive lobular carcinoma. The enhancement extends to the nipple. 2. 6 mm indeterminate mass in the upper-outer quadrant of the right breast. RECOMMENDATION: Treatment planning of the known left breast invasive lobular carcinoma is recommended. MR guided core biopsy of the indeterminate mass in the upper-outer quadrant of the right breast is recommended. BI-RADS CATEGORY  4: Suspicious. Electronically Signed   By: Lillia Mountain M.D.   On: 05/20/2015 11:45   Mm Digital Diagnostic Unilat L  05/06/2015  CLINICAL DATA:  Patient status post ultrasound-guided core needle biopsy left breast mass. EXAM: DIAGNOSTIC LEFT MAMMOGRAM POST ULTRASOUND BIOPSY COMPARISON:  Previous exam(s). FINDINGS: Mammographic images were obtained following ultrasound guided biopsy of left breast mass, 2 o'clock position. Wing shaped marking clip in appropriate position IMPRESSION: Appropriate position wing shaped marking clip status post ultrasound-guided core needle biopsy. Final Assessment: Post Procedure Mammograms for Marker Placement Electronically Signed   By: Lovey Newcomer M.D.   On: 05/06/2015 13:41   [place] Lt Breast Bx W Loc Dev 1st Lesion Img Bx Spec US Guide  05/08/2015  ADDENDUM REPORT: 05/08/2015 13:17 ADDENDUM: Pathology of the left breast biopsy revealed INVASIVE LOBULAR CARCINOMA. PRELIMINARY GRADE: 2. LOBULAR CARCINOMA IN SITU. Comment: These findings were communicated to [facility] in Dr. Barbarann Ehlers office on 05/07/2015 by the pathologist. This was found to be concordant by Dr. Rosana Hoes. Recommendations:  Recommend surgical and oncology referral. Jetta Lout, [facility]) contacted the patient on 05/09/15 for  a post biopsy check. The  patient stated she had bleeding following the biopsy, but it has stopped now. She also has a bruise, but no palpable hematoma. Post biopsy instructions were reviewed with the patient. All of her questions were answered. She was encouraged to call the [place] [facility] with any further questions. The patient stated she has been notified by Dr. Kary Kos of the results. She has an appointment with an oncologist at [facility] on Tuesday, May 12, 2015. Addendum by Jetta Lout, RRA on 05/09/15. Electronically Signed   By: Lovey Newcomer M.D.   On: 05/08/2015 13:17  05/08/2015  CLINICAL DATA:  Patient with suspicious left breast mass. EXAM: ULTRASOUND GUIDED LEFT BREAST CORE NEEDLE BIOPSY COMPARISON:  Previous exam(s). PROCEDURE: I met with the patient and we discussed the procedure of ultrasound-guided biopsy, including benefits and alternatives. We discussed the high likelihood of a successful procedure. We discussed the risks of the procedure including infection, bleeding, tissue injury, clip migration, and inadequate sampling. Informed written consent was given. The usual time-out protocol was performed immediately prior to the procedure. Using sterile technique and 2% Lidocaine as local anesthetic, under direct ultrasound visualization, a 12 gauge vacuum-assisted device was used to perform biopsy of left breast mass 2 o'clock positionusing a lateral approach. At the conclusion of the procedure, a wing shaped tissue marker clip was deployed into the biopsy cavity. Follow-up 2-view mammogram was performed and dictated separately. IMPRESSION: Ultrasound-guided biopsy of left breast mass 2 o'clock position. No apparent complications. Electronically Signed: By: Lovey Newcomer M.D. On: 05/06/2015 13:42    ASSESSMENT: Clinical stage IIb ER positive, PR negative, HER-2 overexpressing invasive lobular cancer of the left breast.  PLAN:    1. Breast cancer: The  vast majority of invasive lobular breast cancers are HER-2 negative. Case discussed with pathology and patient's final pathology is accurate although unusual. MRI results reviewed independently and reported as above with a larger lesion than anticipated based on mammography study. Patient also noted to have suspicious lesion in her right breast. CT scan of the chest, abdomen, and pelvis revealed a 5 mm pulmonary nodule too small to characterize as well as a lesion in her L4 vertebrae that appears to be benign. Typically patients with invasive lobular cancer that are HER-2 positive are ER positive and PR negative as is the case here. Given the size of her tumor and that she is  HER-2 positive, she will benefit from neoadjuvant chemotherapy using Taxotere, carboplatinum, Herceptin, and Perjeta She will also benefit from an aromatase inhibitor at the conclusion of her treatments. Patient will likely require XRT even after mastectomy given the size of her initial tumor. Patient will get a MUGA scan prior to treatment. We will get MRI guided biopsy of the right breast lesion prior to initiating treatment. Return to clinic in 2-3 weeks to initiate cycle 1 neoadjuvant treatment. 2. Elevated blood pressure: Likely secondary to anxiety. Monitor.  Approximately 30 minutes was spent in discussion of which greater than 50% was consultation.  Patient expressed understanding and was in agreement with this plan. She also understands that She can call clinic at any time with any questions, concerns, or complaints.    Lloyd Huger, MD   05/30/2015 6:47 AM

## 2015-06-01 ENCOUNTER — Ambulatory Visit (INDEPENDENT_AMBULATORY_CARE_PROVIDER_SITE_OTHER): Payer: Medicare Other | Admitting: Surgery

## 2015-06-01 ENCOUNTER — Encounter: Payer: Self-pay | Admitting: Surgery

## 2015-06-01 VITALS — BP 146/92 | HR 80 | Temp 97.9°F | Ht 67.0 in | Wt 165.0 lb

## 2015-06-01 DIAGNOSIS — N63 Unspecified lump in unspecified breast: Secondary | ICD-10-CM | POA: Insufficient documentation

## 2015-06-01 DIAGNOSIS — C50912 Malignant neoplasm of unspecified site of left female breast: Secondary | ICD-10-CM | POA: Diagnosis not present

## 2015-06-01 DIAGNOSIS — M25562 Pain in left knee: Secondary | ICD-10-CM | POA: Insufficient documentation

## 2015-06-01 DIAGNOSIS — J302 Other seasonal allergic rhinitis: Secondary | ICD-10-CM | POA: Insufficient documentation

## 2015-06-01 DIAGNOSIS — G4730 Sleep apnea, unspecified: Secondary | ICD-10-CM | POA: Insufficient documentation

## 2015-06-01 DIAGNOSIS — D509 Iron deficiency anemia, unspecified: Secondary | ICD-10-CM | POA: Insufficient documentation

## 2015-06-01 NOTE — Progress Notes (Signed)
Surgical Consultation  06/01/2015  Stephanie Crosby is an 80 y.o. female.   CC: Left Breast cancer  HPI: This patient with biopsy-proven left breast cancer lobular in nature and she has an abnormality on the right side which has an MRI scheduled biopsy on Friday. His been presented of breast cancer conference and the consensus is that she would require neoadjuvant chemotherapy prior to surgery. He sees Dr. Grayland Ormond and chemotherapy is planned for 2 weeks from now  She has a strong family history for ovarian and colon cancer but only 1 first cousin with breast cancer and that was a female patient. G1 P1 Ab1  Past Medical History  Diagnosis Date  . Skin cancer 2011  . Back injury   . Hiatal hernia   . Anemia     Past Surgical History  Procedure Laterality Date  . Appendectomy    . Cataract extraction, bilateral Bilateral 2012    Family History  Problem Relation Age of Onset  . Breast cancer Neg Hx   . Dementia Mother   . Hyperlipidemia Mother   . Hypertension Mother   . Congestive Heart Failure Mother   . Heart disease Mother   . Aneurysm Father     abdominal  . Fibromyalgia Sister   . Heart disease Sister   . Cancer Maternal Uncle     brain  . Heart disease Maternal Uncle   . Ovarian cancer Maternal Grandmother     Social History:  reports that she has never smoked. She has never used smokeless tobacco. She reports that she drinks alcohol. She reports that she does not use illicit drugs.  Allergies:  Allergies  Allergen Reactions  . Penicillins Rash    Medications reviewed.   Review of Systems:   Review of Systems  Constitutional: Negative.   HENT: Negative.   Eyes: Negative.   Respiratory: Negative.   Cardiovascular: Negative.   Gastrointestinal: Positive for heartburn. Negative for nausea and vomiting.  Genitourinary: Negative.   Musculoskeletal: Negative.   Skin: Negative.   Neurological: Negative.   Endo/Heme/Allergies: Negative.    Psychiatric/Behavioral: Negative.      Physical Exam:  BP 146/92 mmHg  Pulse 80  Temp(Src) 97.9 F (36.6 C) (Oral)  Ht 5\' 7"  (1.702 m)  Wt 165 lb (74.844 kg)  BMI 25.84 kg/m2  Physical Exam  Constitutional: She is oriented to person, place, and time and well-developed, well-nourished, and in no distress. No distress.  HENT:  Head: Normocephalic and atraumatic.  Eyes: Pupils are equal, round, and reactive to light. Right eye exhibits no discharge. Left eye exhibits no discharge. No scleral icterus.  Neck: Normal range of motion.  Cardiovascular: Normal rate, regular rhythm and normal heart sounds.   Pulmonary/Chest: Effort normal and breath sounds normal. No respiratory distress. She has no wheezes. She has no rales.  Abdominal: Soft. She exhibits no distension. There is no tenderness.  Musculoskeletal: Normal range of motion. She exhibits no edema.  Lymphadenopathy:    She has no cervical adenopathy.  Neurological: She is alert and oriented to person, place, and time.  Skin: Skin is warm and dry. No rash noted. She is not diaphoretic. No erythema.  Bilateral breast exam is performed. There is no palpable mass in either breast but there is fullness diffusely in the upper outer quadrant of the left breast and some nipple retraction. No palpable nodes in the axilla on either side.  Psychiatric: Mood and affect normal.      No results found  for this or any previous visit (from the past 48 hour(s)). No results found.  Assessment/Plan:  Lobular carcinoma biopsy-proven on the left side. There is a 6 mm lesion on the right side which is to undergo MRI scheduled and directed biopsy this Friday. Discussion concerning treatment options have been performed with Dr. Grayland Ormond as well as port placement and neoadjuvant therapy. We'll schedule the port placement in the near future. I discussed with she and her husband the rationale for placement and the risks of bleeding infection  nonfunction breakage thrombosis pneumothorax or hemopneumothorax any of which could require further surgery chest tube placement or thoracotomy they understood and agreed to proceed multiple questions were answered for them. May be scheduled with either Dr. Azalee Course or Dr. Adonis Huguenin depending on the patient's schedule   Florene Glen, MD, FACS

## 2015-06-01 NOTE — Patient Instructions (Signed)
Angie will contacting you to schedule your port-placement with Dr. Adonis Huguenin.

## 2015-06-02 ENCOUNTER — Telehealth: Payer: Self-pay | Admitting: General Surgery

## 2015-06-02 ENCOUNTER — Inpatient Hospital Stay: Payer: Medicare Other

## 2015-06-02 NOTE — Telephone Encounter (Signed)
I have called patient to go over surgery date. No answer. I have left a message for patient to call back to confirm surgery date and time.   Sx: 06/10/15 with Dr Forrestine Him Placement. Pre op: 06/04/15 @ 11:15am--Office.

## 2015-06-02 NOTE — Telephone Encounter (Signed)
Patient has called back and was informed of all information for surgery date and pre op date and time.

## 2015-06-03 ENCOUNTER — Other Ambulatory Visit: Payer: Medicare Other

## 2015-06-04 ENCOUNTER — Encounter
Admission: RE | Admit: 2015-06-04 | Discharge: 2015-06-04 | Disposition: A | Discharge status: Home / Self Care | Payer: Medicare Other | Source: Ambulatory Visit | Attending: General Surgery | Admitting: General Surgery

## 2015-06-04 DIAGNOSIS — Z01810 Encounter for preprocedural cardiovascular examination: Secondary | ICD-10-CM | POA: Diagnosis present

## 2015-06-04 DIAGNOSIS — Z01812 Encounter for preprocedural laboratory examination: Secondary | ICD-10-CM | POA: Diagnosis present

## 2015-06-04 LAB — SURGICAL PCR SCREEN
MRSA, PCR: NEGATIVE
STAPHYLOCOCCUS AUREUS: NEGATIVE

## 2015-06-04 LAB — CBC WITH DIFFERENTIAL/PLATELET
BASOS ABS: 0 10*3/uL (ref 0–0.1)
BASOS PCT: 1 %
EOS ABS: 0.1 10*3/uL (ref 0–0.7)
EOS PCT: 3 %
HEMATOCRIT: 41.7 % (ref 35.0–47.0)
Hemoglobin: 13.7 g/dL (ref 12.0–16.0)
Lymphocytes Relative: 40 %
Lymphs Abs: 1.9 10*3/uL (ref 1.0–3.6)
MCH: 28.6 pg (ref 26.0–34.0)
MCHC: 32.8 g/dL (ref 32.0–36.0)
MCV: 87.2 fL (ref 80.0–100.0)
MONO ABS: 0.5 10*3/uL (ref 0.2–0.9)
Monocytes Relative: 11 %
Neutro Abs: 2.2 10*3/uL (ref 1.4–6.5)
Neutrophils Relative %: 45 %
Platelets: 248 10*3/uL (ref 150–440)
RBC: 4.78 MIL/uL (ref 3.80–5.20)
RDW: 16.9 % — AB (ref 11.5–14.5)
WBC: 4.8 10*3/uL (ref 3.6–11.0)

## 2015-06-04 LAB — BASIC METABOLIC PANEL
ANION GAP: 5 (ref 5–15)
BUN: 20 mg/dL (ref 6–20)
CO2: 28 mmol/L (ref 22–32)
Calcium: 9.7 mg/dL (ref 8.9–10.3)
Chloride: 107 mmol/L (ref 101–111)
Creatinine, Ser: 0.68 mg/dL (ref 0.44–1.00)
GFR calc non Af Amer: >60 mL/min (ref >60)
GLUCOSE: 92 mg/dL (ref 65–99)
POTASSIUM: 4.8 mmol/L (ref 3.5–5.1)
SODIUM: 140 mmol/L (ref 135–145)

## 2015-06-04 NOTE — Patient Instructions (Signed)
    Your procedure is scheduled on: Wednesday 06/10/15 Report to Day Surgery. 2ND FLOOR MEDICAL MALL [facility] To find out your arrival time please call [phone] between 1PM - 3PM on Tuesday 06/09/15.  Remember: Instructions that are not followed completely may result in serious medical risk, up to and including death, or upon the discretion of your surgeon and anesthesiologist your surgery may need to be rescheduled.    __X__ 1. Do not eat food or drink liquids after midnight. No gum chewing or hard candies.     __X__ 2. No Alcohol for 24 hours before or after surgery.   ____ 3. Bring all medications with you on the day of surgery if instructed.    __X__ 4. Notify your doctor if there is any change in your medical condition     (cold, fever, infections).     Do not wear jewelry, make-up, hairpins, clips or nail polish.  Do not wear lotions, powders, or perfumes. .  Do not shave 48 hours prior to surgery. Men may shave face and neck.  Do not bring valuables to the hospital.    [facility] is not responsible for any belongings or valuables.               Contacts, dentures or bridgework may not be worn into surgery.  Leave your suitcase in the car. After surgery it may be brought to your room.  For patients admitted to the hospital, discharge time is determined by your                treatment team.   Patients discharged the day of surgery will not be allowed to drive home.   Please read over the following fact sheets that you were given:   Surgical Site Infection Prevention   __X__ Take these medicines the morning of surgery with A SIP OF WATER:    1. OMEPRAZOLE  2.   3.   4.  5.  6.  ____ Fleet Enema (as directed)   __X__ Use CHG Soap as directed  ____ Use inhalers on the day of surgery  ____ Stop metformin 2 days prior to surgery    ____ Take 1/2 of usual insulin dose the night before surgery and none on the morning of surgery.   ____ Stop Coumadin/Plavix/aspirin on    __X__ Stop Anti-inflammatories on NO NSAIDS FOR 7 DAYS PRIOR TO SURGERY YOU MAY TAKE TYLENOL IF NEEDED   ____ Stop supplements until after surgery.    ____ Bring C-Pap to the hospital.

## 2015-06-05 ENCOUNTER — Ambulatory Visit
Admission: RE | Admit: 2015-06-05 | Discharge: 2015-06-05 | Disposition: A | Discharge status: Home / Self Care | Payer: Medicare Other | Source: Ambulatory Visit | Attending: Oncology | Admitting: Oncology

## 2015-06-05 DIAGNOSIS — R928 Other abnormal and inconclusive findings on diagnostic imaging of breast: Secondary | ICD-10-CM

## 2015-06-05 MED ORDER — GADOBENATE DIMEGLUMINE 529 MG/ML IV SOLN
15.0000 mL | Freq: Once | INTRAVENOUS | Status: AC | PRN
  Administered 2015-06-05: 15 mL via INTRAVENOUS

## 2015-06-08 ENCOUNTER — Telehealth: Payer: Self-pay

## 2015-06-08 MED ORDER — ESTROGENS, CONJUGATED 0.625 MG/GM VA CREA
TOPICAL_CREAM | VAGINAL | Status: AC
  Filled 2015-06-08: qty 30

## 2015-06-08 NOTE — Telephone Encounter (Signed)
Wanted to inform you that the results from biopsy is resulted.

## 2015-06-09 ENCOUNTER — Encounter
Admission: RE | Admit: 2015-06-09 | Discharge: 2015-06-09 | Disposition: A | Discharge status: Home / Self Care | Payer: Medicare Other | Source: Ambulatory Visit | Attending: Oncology | Admitting: Oncology

## 2015-06-09 DIAGNOSIS — C50912 Malignant neoplasm of unspecified site of left female breast: Secondary | ICD-10-CM | POA: Insufficient documentation

## 2015-06-09 MED ORDER — TECHNETIUM TC 99M-LABELED RED BLOOD CELLS IV KIT
20.0000 | PACK | Freq: Once | INTRAVENOUS | Status: AC | PRN
  Administered 2015-06-09: 19.96 via INTRAVENOUS

## 2015-06-10 ENCOUNTER — Ambulatory Visit
Admission: RE | Admit: 2015-06-10 | Discharge: 2015-06-10 | Disposition: A | Discharge status: Home / Self Care | Payer: Medicare Other | Source: Ambulatory Visit | Attending: General Surgery | Admitting: General Surgery

## 2015-06-10 ENCOUNTER — Encounter: Payer: Self-pay | Admitting: *Deleted

## 2015-06-10 ENCOUNTER — Ambulatory Visit: Payer: Medicare Other | Admitting: *Deleted

## 2015-06-10 ENCOUNTER — Ambulatory Visit: Payer: Medicare Other

## 2015-06-10 ENCOUNTER — Encounter
Admission: RE | Disposition: A | Discharge status: Home / Self Care | Payer: Self-pay | Source: Ambulatory Visit | Attending: General Surgery

## 2015-06-10 DIAGNOSIS — C50912 Malignant neoplasm of unspecified site of left female breast: Secondary | ICD-10-CM | POA: Diagnosis not present

## 2015-06-10 DIAGNOSIS — Z95828 Presence of other vascular implants and grafts: Secondary | ICD-10-CM

## 2015-06-10 DIAGNOSIS — Z820 Family history of epilepsy and other diseases of the nervous system: Secondary | ICD-10-CM | POA: Insufficient documentation

## 2015-06-10 DIAGNOSIS — Z9841 Cataract extraction status, right eye: Secondary | ICD-10-CM | POA: Insufficient documentation

## 2015-06-10 DIAGNOSIS — Z85828 Personal history of other malignant neoplasm of skin: Secondary | ICD-10-CM | POA: Insufficient documentation

## 2015-06-10 DIAGNOSIS — C50911 Malignant neoplasm of unspecified site of right female breast: Secondary | ICD-10-CM | POA: Diagnosis not present

## 2015-06-10 DIAGNOSIS — K219 Gastro-esophageal reflux disease without esophagitis: Secondary | ICD-10-CM | POA: Diagnosis not present

## 2015-06-10 DIAGNOSIS — Z880 Allergy status to penicillin: Secondary | ICD-10-CM | POA: Insufficient documentation

## 2015-06-10 DIAGNOSIS — K449 Diaphragmatic hernia without obstruction or gangrene: Secondary | ICD-10-CM | POA: Diagnosis not present

## 2015-06-10 DIAGNOSIS — Z8249 Family history of ischemic heart disease and other diseases of the circulatory system: Secondary | ICD-10-CM | POA: Diagnosis not present

## 2015-06-10 DIAGNOSIS — Z808 Family history of malignant neoplasm of other organs or systems: Secondary | ICD-10-CM | POA: Insufficient documentation

## 2015-06-10 DIAGNOSIS — Z9842 Cataract extraction status, left eye: Secondary | ICD-10-CM | POA: Insufficient documentation

## 2015-06-10 DIAGNOSIS — Z8269 Family history of other diseases of the musculoskeletal system and connective tissue: Secondary | ICD-10-CM | POA: Diagnosis not present

## 2015-06-10 DIAGNOSIS — Z800 Family history of malignant neoplasm of digestive organs: Secondary | ICD-10-CM | POA: Insufficient documentation

## 2015-06-10 DIAGNOSIS — Z803 Family history of malignant neoplasm of breast: Secondary | ICD-10-CM | POA: Diagnosis not present

## 2015-06-10 DIAGNOSIS — C50411 Malignant neoplasm of upper-outer quadrant of right female breast: Secondary | ICD-10-CM | POA: Insufficient documentation

## 2015-06-10 DIAGNOSIS — Z8041 Family history of malignant neoplasm of ovary: Secondary | ICD-10-CM | POA: Insufficient documentation

## 2015-06-10 HISTORY — PX: PORTACATH PLACEMENT: SHX2246

## 2015-06-10 SURGERY — INSERTION, TUNNELED CENTRAL VENOUS DEVICE, WITH PORT
Anesthesia: General | Laterality: Right | Wound class: Clean

## 2015-06-10 MED ORDER — SODIUM CHLORIDE 0.9 % IV SOLN
INTRAVENOUS | Status: DC | PRN
  Administered 2015-06-10: 20 mL via INTRAMUSCULAR

## 2015-06-10 MED ORDER — FENTANYL CITRATE (PF) 100 MCG/2ML IJ SOLN
INTRAMUSCULAR | Status: DC | PRN
  Administered 2015-06-10 (×2): 50 ug via INTRAVENOUS

## 2015-06-10 MED ORDER — CHLORHEXIDINE GLUCONATE 4 % EX LIQD: 1.0000 "application " | Freq: Once | CUTANEOUS | Status: DC

## 2015-06-10 MED ORDER — PROPOFOL 10 MG/ML IV BOLUS
INTRAVENOUS | Status: DC | PRN
  Administered 2015-06-10: 150 mg via INTRAVENOUS

## 2015-06-10 MED ORDER — METOCLOPRAMIDE HCL 5 MG/ML IJ SOLN: 10.0000 mg | Freq: Once | INTRAMUSCULAR | Status: DC | PRN

## 2015-06-10 MED ORDER — ONDANSETRON HCL 4 MG/2ML IJ SOLN
INTRAMUSCULAR | Status: DC | PRN
  Administered 2015-06-10: 4 mg via INTRAVENOUS

## 2015-06-10 MED ORDER — FENTANYL CITRATE (PF) 100 MCG/2ML IJ SOLN
25.0000 ug | INTRAMUSCULAR | Status: DC | PRN
  Administered 2015-06-10 (×2): 25 ug via INTRAVENOUS

## 2015-06-10 MED ORDER — MIDAZOLAM HCL 2 MG/2ML IJ SOLN
INTRAMUSCULAR | Status: DC | PRN
  Administered 2015-06-10 (×2): 1 mg via INTRAVENOUS

## 2015-06-10 MED ORDER — CIPROFLOXACIN IN D5W 400 MG/200ML IV SOLN
INTRAVENOUS | Status: AC
  Administered 2015-06-10: 400 mg via INTRAVENOUS
  Filled 2015-06-10: qty 200

## 2015-06-10 MED ORDER — PHENYLEPHRINE HCL 10 MG/ML IJ SOLN
INTRAMUSCULAR | Status: DC | PRN
  Administered 2015-06-10: 100 ug via INTRAVENOUS

## 2015-06-10 MED ORDER — FENTANYL CITRATE (PF) 100 MCG/2ML IJ SOLN
INTRAMUSCULAR | Status: AC
  Administered 2015-06-10: 25 ug via INTRAVENOUS
  Filled 2015-06-10: qty 2

## 2015-06-10 MED ORDER — EPHEDRINE SULFATE 50 MG/ML IJ SOLN
INTRAMUSCULAR | Status: DC | PRN
  Administered 2015-06-10 (×2): 10 mg via INTRAVENOUS

## 2015-06-10 MED ORDER — CIPROFLOXACIN IN D5W 400 MG/200ML IV SOLN
400.0000 mg | INTRAVENOUS | Status: AC
  Administered 2015-06-10: 400 mg via INTRAVENOUS

## 2015-06-10 MED ORDER — LIDOCAINE HCL (CARDIAC) 20 MG/ML IV SOLN
INTRAVENOUS | Status: DC | PRN
  Administered 2015-06-10: 30 mg via INTRAVENOUS

## 2015-06-10 MED ORDER — BUPIVACAINE-EPINEPHRINE (PF) 0.25% -1:200000 IJ SOLN
INTRAMUSCULAR | Status: AC
  Filled 2015-06-10: qty 30

## 2015-06-10 MED ORDER — SODIUM CHLORIDE 0.9 % IJ SOLN
INTRAMUSCULAR | Status: AC
  Filled 2015-06-10: qty 50

## 2015-06-10 MED ORDER — BUPIVACAINE-EPINEPHRINE (PF) 0.25% -1:200000 IJ SOLN
INTRAMUSCULAR | Status: DC | PRN
  Administered 2015-06-10: 15 mL

## 2015-06-10 MED ORDER — LACTATED RINGERS IV SOLN
INTRAVENOUS | Status: DC
  Administered 2015-06-10: 10:00:00 via INTRAVENOUS

## 2015-06-10 MED ORDER — HEPARIN SODIUM (PORCINE) 5000 UNIT/ML IJ SOLN
INTRAMUSCULAR | Status: AC
  Filled 2015-06-10: qty 1

## 2015-06-10 MED ORDER — EPHEDRINE SULFATE 50 MG/ML IJ SOLN: INTRAMUSCULAR | Status: DC | PRN

## 2015-06-10 SURGICAL SUPPLY — 26 items
ADHESIVE MASTISOL STRL (MISCELLANEOUS) ×3 IMPLANT
CANISTER SUCT 1200ML W/VALVE (MISCELLANEOUS) ×3 IMPLANT
CHLORAPREP W/TINT 26ML (MISCELLANEOUS) ×3 IMPLANT
CLOSURE WOUND 1/2 X4 (GAUZE/BANDAGES/DRESSINGS) ×1
COVER LIGHT HANDLE STERIS (MISCELLANEOUS) ×6 IMPLANT
DRAPE C-ARM XRAY 36X54 (DRAPES) ×3 IMPLANT
ELECT REM PT RETURN 9FT ADLT (ELECTROSURGICAL) ×3
ELECTRODE REM PT RTRN 9FT ADLT (ELECTROSURGICAL) ×1 IMPLANT
GAUZE SPONGE 4X4 12PLY STRL (GAUZE/BANDAGES/DRESSINGS) ×3 IMPLANT
GLOVE BIO SURGEON STRL SZ7.5 (GLOVE) ×6 IMPLANT
GLOVE INDICATOR 8.0 STRL GRN (GLOVE) ×6 IMPLANT
GOWN STRL REUS W/ TWL LRG LVL3 (GOWN DISPOSABLE) ×2 IMPLANT
GOWN STRL REUS W/TWL LRG LVL3 (GOWN DISPOSABLE) ×4
KIT PORT POWER 8FR ISP CVUE (Catheter) ×3 IMPLANT
KIT RM TURNOVER STRD PROC AR (KITS) ×3 IMPLANT
LABEL OR SOLS (LABEL) ×3 IMPLANT
NEEDLE FILTER BLUNT 18X 1/2SAF (NEEDLE) ×2
NEEDLE FILTER BLUNT 18X1 1/2 (NEEDLE) ×1 IMPLANT
PACK PORT-A-CATH (MISCELLANEOUS) ×3 IMPLANT
STRIP CLOSURE SKIN 1/2X4 (GAUZE/BANDAGES/DRESSINGS) ×2 IMPLANT
SUT MNCRL AB 4-0 PS2 18 (SUTURE) ×3 IMPLANT
SUT PROLENE 3 0 SH DA (SUTURE) ×3 IMPLANT
SUT VIC AB 3-0 SH 27 (SUTURE) ×2
SUT VIC AB 3-0 SH 27X BRD (SUTURE) ×1 IMPLANT
SYR 20CC LL (SYRINGE) ×3 IMPLANT
SYR 3ML LL SCALE MARK (SYRINGE) ×3 IMPLANT

## 2015-06-10 NOTE — Interval H&P Note (Signed)
History and Physical Interval Note:  06/10/2015 8:48 AM  Stephanie Crosby  has presented today for surgery, with the diagnosis of Breast cancer  The various methods of treatment have been discussed with the patient and family. After consideration of risks, benefits and other options for treatment, the patient has consented to  Procedure(s): INSERTION PORT-A-CATH (N/A) as a surgical intervention .  The patient's history has been reviewed, patient examined, no change in status, stable for surgery.  I have reviewed the patient's chart and labs.  Questions were answered to the patient's satisfaction.     [name]

## 2015-06-10 NOTE — Anesthesia Postprocedure Evaluation (Signed)
Anesthesia Post Note  Patient: Stephanie Crosby  Procedure(s) Performed: Procedure(s) (LRB): INSERTION PORT-A-CATH (Right)  Patient location during evaluation: PACU Level of consciousness: awake and alert Pain management: pain level controlled Vital Signs Assessment: post-procedure vital signs reviewed and stable Respiratory status: spontaneous breathing Cardiovascular status: blood pressure returned to baseline Postop Assessment: no headache Anesthetic complications: no    Last Vitals:  Filed Vitals:   06/10/15 1120 06/10/15 1133  BP: 122/76 115/71  Pulse: 93 84  Temp: 36.4 C   Resp: 25 20    Last Pain: There were no vitals filed for this visit.               Zarianna Dicarlo M

## 2015-06-10 NOTE — Op Note (Signed)
  06/10/2015  11:10 AM  PATIENT:  Stephanie Crosby  80 y.o. female  PRE-OPERATIVE DIAGNOSIS:   Breast cancer  POST-OPERATIVE DIAGNOSIS:   same  PROCEDURE:   Right subclavian chemotherapy port placement  SURGEON:  Surgeon(s) and Role:    * [name], MD - Primary  ASSISTANTS:  none  ANESTHESIA:  Gen.  INDICATIONS FOR PROCEDURE  Best cancer  DICTATION:  Patient advised in the preop holding area where her history , indications, consent were again confirmed. All questions were answered to her understanding and she wished proceed with a Chemo-Port for neoadjuvant chemotherapy for breast cancer.   Patient brought operating placed in supine position where general anesthesia was obtained via a laryngeal mask. After uneventful induction of anesthesia and patient was prepped and draped in the standard sterile fashion. We then performed a timeout that correctly identified the patient and procedure and safety precautions. The operating team confirmed proceeded with a right subclavian Chemo-Port placement.   Location for the port insertion was identified and localized with a 1% lidocaine solution. Next the subclavian vein was accessed with the provided needle from a Bard PowerPort. He was easily obtained on the first attempt. The wire was placed through the needle and fluoroscopy was used to confirm location of the wire. Upon doing so the wire was not in a sufficient location and had to be withdrawn. The vein was easily accessed again and this time the wire was confirmed in the vena cava. The access needle was removed and the skin was incised with 15 blade scalpel. Using the provided dilator and insertion device over the wire using a Seldinger technique the wire was exchanged for the port catheter itself and confirmed in the superior vena cava under fluoroscopy.   The port pocket was then created using a 15 blade scalpel and Bovie much cautery until the pocket was large enough to accept the  provided Bard PowerPort. Using a tunneling device the catheter was tunneled to the created pocket. The catheter was then cut to the appropriate length and fitted onto the PowerPort with the locking collar. The port was then accessed and any air within the catheter was extracted prior to being flushed with heparinized saline. The port was secured in 2 places with a 2-0 Prolene suture and placed into the port pocket. Port was again accessed and found to flush easily.   The entire area was re-localized with a former smoker anesthetic. The pocket was closed with an interrupted 3-0 Vicryl suture. The skin was approximated with a running subarticular 4-0 Monocryl suture. All of her operative sites were then closed with Mastisol and Steri-Strips prior to a sterile dressing being placed over this with Tegaderm and Telfa.   Patient tolerated procedure well was awoken from general anesthesia prior to being transferred to the PACU in good condition. All counts were correct prior to the end of the procedure. There were no documented, complications.   Findings: Normal right subclavian vein access   Complications: None   estimated blood loss: 5 mL   [name], MD

## 2015-06-10 NOTE — Anesthesia Procedure Notes (Signed)
Procedure Name: LMA Insertion Date/Time: 06/10/2015 10:15 AM Performed by: Courtney Paris Pre-anesthesia Checklist: Patient identified, Emergency Drugs available, Suction available, Patient being monitored and Timeout performed Patient Re-evaluated:Patient Re-evaluated prior to inductionOxygen Delivery Method: Circle system utilized Preoxygenation: Pre-oxygenation with 100% oxygen Intubation Type: IV induction and Combination inhalational/ intravenous induction Ventilation: Mask ventilation without difficulty LMA: LMA inserted LMA Size: 3.0 Grade View: Grade II Tube type: Oral Number of attempts: 1 Placement Confirmation: positive ETCO2,  CO2 detector and breath sounds checked- equal and bilateral Tube secured with: Tape Dental Injury: Teeth and Oropharynx as per pre-operative assessment

## 2015-06-10 NOTE — H&P (View-Only) (Signed)
Surgical Consultation  06/01/2015  Stephanie Crosby is an 80 y.o. female.   CC: Left Breast cancer  HPI: This patient with biopsy-proven left breast cancer lobular in nature and she has an abnormality on the right side which has an MRI scheduled biopsy on Friday. His been presented of breast cancer conference and the consensus is that she would require neoadjuvant chemotherapy prior to surgery. He sees Dr. Grayland Ormond and chemotherapy is planned for 2 weeks from now  She has a strong family history for ovarian and colon cancer but only 1 first cousin with breast cancer and that was a female patient. G1 P1 Ab1  Past Medical History  Diagnosis Date  . Skin cancer 2011  . Back injury   . Hiatal hernia   . Anemia     Past Surgical History  Procedure Laterality Date  . Appendectomy    . Cataract extraction, bilateral Bilateral 2012    Family History  Problem Relation Age of Onset  . Breast cancer Neg Hx   . Dementia Mother   . Hyperlipidemia Mother   . Hypertension Mother   . Congestive Heart Failure Mother   . Heart disease Mother   . Aneurysm Father     abdominal  . Fibromyalgia Sister   . Heart disease Sister   . Cancer Maternal Uncle     brain  . Heart disease Maternal Uncle   . Ovarian cancer Maternal Grandmother     Social History:  reports that she has never smoked. She has never used smokeless tobacco. She reports that she drinks alcohol. She reports that she does not use illicit drugs.  Allergies:  Allergies  Allergen Reactions  . Penicillins Rash    Medications reviewed.   Review of Systems:   Review of Systems  Constitutional: Negative.   HENT: Negative.   Eyes: Negative.   Respiratory: Negative.   Cardiovascular: Negative.   Gastrointestinal: Positive for heartburn. Negative for nausea and vomiting.  Genitourinary: Negative.   Musculoskeletal: Negative.   Skin: Negative.   Neurological: Negative.   Endo/Heme/Allergies: Negative.    Psychiatric/Behavioral: Negative.      Physical Exam:  BP 146/92 mmHg  Pulse 80  Temp(Src) 97.9 F (36.6 C) (Oral)  Ht 5\' 7"  (1.702 m)  Wt 165 lb (74.844 kg)  BMI 25.84 kg/m2  Physical Exam  Constitutional: She is oriented to person, place, and time and well-developed, well-nourished, and in no distress. No distress.  HENT:  Head: Normocephalic and atraumatic.  Eyes: Pupils are equal, round, and reactive to light. Right eye exhibits no discharge. Left eye exhibits no discharge. No scleral icterus.  Neck: Normal range of motion.  Cardiovascular: Normal rate, regular rhythm and normal heart sounds.   Pulmonary/Chest: Effort normal and breath sounds normal. No respiratory distress. She has no wheezes. She has no rales.  Abdominal: Soft. She exhibits no distension. There is no tenderness.  Musculoskeletal: Normal range of motion. She exhibits no edema.  Lymphadenopathy:    She has no cervical adenopathy.  Neurological: She is alert and oriented to person, place, and time.  Skin: Skin is warm and dry. No rash noted. She is not diaphoretic. No erythema.  Bilateral breast exam is performed. There is no palpable mass in either breast but there is fullness diffusely in the upper outer quadrant of the left breast and some nipple retraction. No palpable nodes in the axilla on either side.  Psychiatric: Mood and affect normal.      No results found  for this or any previous visit (from the past 48 hour(s)). No results found.  Assessment/Plan:  Lobular carcinoma biopsy-proven on the left side. There is a 6 mm lesion on the right side which is to undergo MRI scheduled and directed biopsy this Friday. Discussion concerning treatment options have been performed with Dr. Grayland Ormond as well as port placement and neoadjuvant therapy. We'll schedule the port placement in the near future. I discussed with she and her husband the rationale for placement and the risks of bleeding infection  nonfunction breakage thrombosis pneumothorax or hemopneumothorax any of which could require further surgery chest tube placement or thoracotomy they understood and agreed to proceed multiple questions were answered for them. May be scheduled with either Dr. Azalee Course or Dr. Adonis Huguenin depending on the patient's schedule   Florene Glen, MD, FACS

## 2015-06-10 NOTE — Anesthesia Preprocedure Evaluation (Addendum)
Anesthesia Evaluation  Patient identified by MRN, date of birth, ID band Patient awake    Reviewed: Allergy & Precautions, NPO status , Patient's Chart, lab work & pertinent test results  Airway Mallampati: II  TM Distance: >3 FB Neck ROM: Limited    Dental  (+) Teeth Intact   Pulmonary sleep apnea and Continuous Positive Airway Pressure Ventilation ,    Pulmonary exam normal        Cardiovascular Exercise Tolerance: Good negative cardio ROS Normal cardiovascular exam     Neuro/Psych    GI/Hepatic hiatal hernia, GERD  Medicated and Controlled,  Endo/Other    Renal/GU      Musculoskeletal   Abdominal (+)  Abdomen: soft.    Peds  Hematology Hb 13.7. She is not anemic.   Anesthesia Other Findings   Reproductive/Obstetrics                             Anesthesia Physical Anesthesia Plan  ASA: III  Anesthesia Plan: General   Post-op Pain Management:    Induction: Intravenous  Airway Management Planned: LMA  Additional Equipment:   Intra-op Plan:   Post-operative Plan: Extubation in OR  Informed Consent: I have reviewed the patients History and Physical, chart, labs and discussed the procedure including the risks, benefits and alternatives for the proposed anesthesia with the patient or authorized representative who has indicated his/her understanding and acceptance.     Plan Discussed with: CRNA  Anesthesia Plan Comments:         Anesthesia Quick Evaluation

## 2015-06-10 NOTE — Discharge Instructions (Addendum)
Implanted Port Home Guide °An implanted port is a type of central line that is placed under the skin. Central lines are used to provide IV access when treatment or nutrition needs to be given through a person's veins. Implanted ports are used for long-term IV access. An implanted port may be placed because:  °· You need IV medicine that would be irritating to the small veins in your hands or arms.   °· You need long-term IV medicines, such as antibiotics.   °· You need IV nutrition for a long period.   °· You need frequent blood draws for lab tests.   °· You need dialysis.   °Implanted ports are usually placed in the chest area, but they can also be placed in the upper arm, the abdomen, or the leg. An implanted port has two main parts:  °· Reservoir. The reservoir is round and will appear as a small, raised area under your skin. The reservoir is the part where a needle is inserted to give medicines or draw blood.   °· Catheter. The catheter is a thin, flexible tube that extends from the reservoir. The catheter is placed into a large vein. Medicine that is inserted into the reservoir goes into the catheter and then into the vein.   °HOW WILL I CARE FOR MY INCISION SITE? °Do not get the incision site wet. Bathe or shower as directed by your health care provider.  °HOW IS MY PORT ACCESSED? °Special steps must be taken to access the port:  °· Before the port is accessed, a numbing cream can be placed on the skin. This helps numb the skin over the port site.   °· Your health care provider uses a sterile technique to access the port. °· Your health care provider must put on a mask and sterile gloves. °· The skin over your port is cleaned carefully with an antiseptic and allowed to dry. °· The port is gently pinched between sterile gloves, and a needle is inserted into the port. °· Only "non-coring" port needles should be used to access the port. Once the port is accessed, a blood return should be checked. This helps  ensure that the port is in the vein and is not clogged.   °· If your port needs to remain accessed for a constant infusion, a clear (transparent) bandage will be placed over the needle site. The bandage and needle will need to be changed every week, or as directed by your health care provider.   °· Keep the bandage covering the needle clean and dry. Do not get it wet. Follow your health care provider's instructions on how to take a shower or bath while the port is accessed.   °· If your port does not need to stay accessed, no bandage is needed over the port.   °WHAT IS FLUSHING? °Flushing helps keep the port from getting clogged. Follow your health care provider's instructions on how and when to flush the port. Ports are usually flushed with saline solution or a medicine called heparin. The need for flushing will depend on how the port is used.  °· If the port is used for intermittent medicines or blood draws, the port will need to be flushed:   °· After medicines have been given.   °· After blood has been drawn.   °· As part of routine maintenance.   °· If a constant infusion is running, the port may not need to be flushed.   °HOW LONG WILL MY PORT STAY IMPLANTED? °The port can stay in for as long as your health care   provider thinks it is needed. When it is time for the port to come out, surgery will be done to remove it. The procedure is similar to the one performed when the port was put in.  WHEN SHOULD I SEEK IMMEDIATE MEDICAL CARE? When you have an implanted port, you should seek immediate medical care if:   You notice a bad smell coming from the incision site.   You have swelling, redness, or drainage at the incision site.   You have more swelling or pain at the port site or the surrounding area.   You have a fever that is not controlled with medicine.   This information is not intended to replace advice given to you by your health care provider. Make sure you discuss any questions you have with  your health care provider.   Document Released: 04/18/2005 Document Revised: 02/06/2013 Document Reviewed: 12/24/2012 Elsevier Interactive Patient Education 2016 [place]   1) The drugs that you were given will stay in your system until tomorrow so for the next 24 hours you should not:  A) Drive an automobile B) Make any legal decisions C) Drink any alcoholic beverage   2) You may resume regular meals tomorrow.  Today it is better to start with liquids and gradually work up to solid foods.  You may eat anything you prefer, but it is better to start with liquids, then soup and crackers, and gradually work up to solid foods.   3) Please notify your doctor immediately if you have any unusual bleeding, trouble breathing, redness and pain at the surgery site, drainage, fever, or pain not relieved by medication.    4) Additional Instructions:        Please contact your physician with any problems or Same Day Surgery at [phone], Monday through Friday 6 am to 4 pm, or [place] at [facility] number at [phone].AMBULATORY SURGERY  DISCHARGE INSTRUCTIONS   5) The drugs that you were given will stay in your system until tomorrow so for the next 24 hours you should not:  D) Drive an automobile E) Make any legal decisions F) Drink any alcoholic beverage   6) You may resume regular meals tomorrow.  Today it is better to start with liquids and gradually work up to solid foods.  You may eat anything you prefer, but it is better to start with liquids, then soup and crackers, and gradually work up to solid foods.   7) Please notify your doctor immediately if you have any unusual bleeding, trouble breathing, redness and pain at the surgery site, drainage, fever, or pain not relieved by medication.    8) Additional Instructions:        Please contact your physician with any problems or Same Day Surgery at  [phone], Monday through Friday 6 am to 4 pm, or [place] at [facility] number at [phone].

## 2015-06-10 NOTE — Transfer of Care (Signed)
Immediate Anesthesia Transfer of Care Note  Patient: Stephanie Crosby  Procedure(s) Performed: Procedure(s): INSERTION PORT-A-CATH (Right)  Patient Location: PACU  Anesthesia Type:General  Level of Consciousness: sedated  Airway & Oxygen Therapy: Patient Spontanous Breathing and Patient connected to face mask oxygen  Post-op Assessment: Report given to RN and Post -op Vital signs reviewed and stable  Post vital signs: Reviewed and stable  Last Vitals:  Filed Vitals:   06/10/15 0834 06/10/15 1118  BP: 158/96 122/76  Pulse: 86 96  Temp: 36.7 C 36.4 C  Resp: 86 20    Complications: No apparent anesthesia complications

## 2015-06-10 NOTE — Brief Op Note (Signed)
06/10/2015  11:09 AM  PATIENT:  Stephanie Crosby  80 y.o. female  PRE-OPERATIVE DIAGNOSIS:  Breast cancer  POST-OPERATIVE DIAGNOSIS:  Breast cancer  PROCEDURE:  Procedure(s): INSERTION PORT-A-CATH (Right)  SURGEON:  Surgeon(s) and Role:    * [name], MD - Primary  PHYSICIAN ASSISTANT:   ASSISTANTS: none   ANESTHESIA:   general  EBL:  Total I/O In: 600 [I.V.:600] Out: -   BLOOD ADMINISTERED:none  DRAINS: none   LOCAL MEDICATIONS USED:  XYLOCAINE  and Amount: 15 ml  SPECIMEN:  No Specimen  DISPOSITION OF SPECIMEN:  N/A  COUNTS:  YES  TOURNIQUET:  * No tourniquets in log *  DICTATION: .Dragon Dictation  PLAN OF CARE: Discharge to home after PACU  PATIENT DISPOSITION:  PACU - hemodynamically stable.   Delay start of Pharmacological VTE agent (>24hrs) due to surgical blood loss or risk of bleeding: not applicable

## 2015-06-15 ENCOUNTER — Encounter: Payer: Self-pay | Admitting: Surgery

## 2015-06-15 ENCOUNTER — Ambulatory Visit (INDEPENDENT_AMBULATORY_CARE_PROVIDER_SITE_OTHER): Payer: Medicare Other | Admitting: Surgery

## 2015-06-15 ENCOUNTER — Other Ambulatory Visit: Payer: Self-pay

## 2015-06-15 VITALS — BP 150/95 | HR 84 | Temp 98.2°F | Ht 67.0 in | Wt 166.0 lb

## 2015-06-15 DIAGNOSIS — C50919 Malignant neoplasm of unspecified site of unspecified female breast: Secondary | ICD-10-CM

## 2015-06-15 NOTE — Progress Notes (Signed)
Stephanie Crosby is a 80 year old female status post placement of a right subclavian Port-A-Cath by Dr. Adonis Huguenin. She had a recently diagnosed left breast invasive lobular carcinoma ER HER-2 positive and PR negative. On the right side she just underwent an MRI guided biopsy showing invasive ductal carcinoma ER and PR positive HER-2/neu negative. No complaints regarding port and she is rated 2 start chemotherapy tomorrow  Physical exam she is in no acute distress incision is healing well there is no evidence of flexion there is significant ecchymosis on the right breast secondary to the recent biopsy no evidence of expanding hematoma  A/P doing well from poor perspective. We will make sure that she has a follow-up appointment with the breast surgeon I believe that there is another group that is doing her breast surgical therapy and we will check with oncology office about this. Port is in good position and no evidence of complications

## 2015-06-15 NOTE — Patient Instructions (Signed)
Your port looks great and may be used tomorrow at Chemotherapy.  You may begin your medications that you have been holding, if this is ok with Oncologist.  Please call our office with any questions or concerns.

## 2015-06-16 ENCOUNTER — Inpatient Hospital Stay (HOSPITAL_BASED_OUTPATIENT_CLINIC_OR_DEPARTMENT_OTHER): Payer: Medicare Other | Admitting: Oncology

## 2015-06-16 ENCOUNTER — Encounter: Payer: Self-pay | Admitting: Oncology

## 2015-06-16 ENCOUNTER — Inpatient Hospital Stay: Payer: Medicare Other | Attending: Oncology

## 2015-06-16 ENCOUNTER — Inpatient Hospital Stay: Payer: Medicare Other

## 2015-06-16 VITALS — BP 140/87 | HR 87 | Temp 97.4°F | Wt 164.1 lb

## 2015-06-16 DIAGNOSIS — Z170 Estrogen receptor positive status [ER+]: Secondary | ICD-10-CM

## 2015-06-16 DIAGNOSIS — G4730 Sleep apnea, unspecified: Secondary | ICD-10-CM | POA: Diagnosis not present

## 2015-06-16 DIAGNOSIS — Z8041 Family history of malignant neoplasm of ovary: Secondary | ICD-10-CM

## 2015-06-16 DIAGNOSIS — K449 Diaphragmatic hernia without obstruction or gangrene: Secondary | ICD-10-CM

## 2015-06-16 DIAGNOSIS — D709 Neutropenia, unspecified: Secondary | ICD-10-CM | POA: Diagnosis not present

## 2015-06-16 DIAGNOSIS — R030 Elevated blood-pressure reading, without diagnosis of hypertension: Secondary | ICD-10-CM | POA: Insufficient documentation

## 2015-06-16 DIAGNOSIS — C50912 Malignant neoplasm of unspecified site of left female breast: Secondary | ICD-10-CM | POA: Diagnosis not present

## 2015-06-16 DIAGNOSIS — Z79899 Other long term (current) drug therapy: Secondary | ICD-10-CM | POA: Diagnosis not present

## 2015-06-16 DIAGNOSIS — F419 Anxiety disorder, unspecified: Secondary | ICD-10-CM | POA: Insufficient documentation

## 2015-06-16 DIAGNOSIS — K219 Gastro-esophageal reflux disease without esophagitis: Secondary | ICD-10-CM | POA: Insufficient documentation

## 2015-06-16 DIAGNOSIS — C50911 Malignant neoplasm of unspecified site of right female breast: Secondary | ICD-10-CM

## 2015-06-16 DIAGNOSIS — N644 Mastodynia: Secondary | ICD-10-CM | POA: Diagnosis not present

## 2015-06-16 DIAGNOSIS — K121 Other forms of stomatitis: Secondary | ICD-10-CM | POA: Insufficient documentation

## 2015-06-16 DIAGNOSIS — Z85828 Personal history of other malignant neoplasm of skin: Secondary | ICD-10-CM | POA: Insufficient documentation

## 2015-06-16 DIAGNOSIS — Z5111 Encounter for antineoplastic chemotherapy: Secondary | ICD-10-CM | POA: Insufficient documentation

## 2015-06-16 DIAGNOSIS — R197 Diarrhea, unspecified: Secondary | ICD-10-CM | POA: Diagnosis not present

## 2015-06-16 DIAGNOSIS — Z803 Family history of malignant neoplasm of breast: Secondary | ICD-10-CM | POA: Insufficient documentation

## 2015-06-16 DIAGNOSIS — Z5112 Encounter for antineoplastic immunotherapy: Secondary | ICD-10-CM | POA: Diagnosis not present

## 2015-06-16 DIAGNOSIS — D649 Anemia, unspecified: Secondary | ICD-10-CM | POA: Diagnosis not present

## 2015-06-16 LAB — COMPREHENSIVE METABOLIC PANEL
ALT: 18 U/L (ref 14–54)
AST: 20 U/L (ref 15–41)
Albumin: 4.1 g/dL (ref 3.5–5.0)
Alkaline Phosphatase: 62 U/L (ref 38–126)
Anion gap: 5 (ref 5–15)
BUN: 24 mg/dL — ABNORMAL HIGH (ref 6–20)
CO2: 24 mmol/L (ref 22–32)
Calcium: 9.5 mg/dL (ref 8.9–10.3)
Chloride: 108 mmol/L (ref 101–111)
Creatinine, Ser: 0.69 mg/dL (ref 0.44–1.00)
GFR calc Af Amer: >60 mL/min (ref >60)
GFR calc non Af Amer: >60 mL/min (ref >60)
Glucose, Bld: 161 mg/dL — ABNORMAL HIGH (ref 65–99)
Potassium: 3.6 mmol/L (ref 3.5–5.1)
Sodium: 137 mmol/L (ref 135–145)
Total Bilirubin: 1.1 mg/dL (ref 0.3–1.2)
Total Protein: 7.1 g/dL (ref 6.5–8.1)

## 2015-06-16 LAB — CBC WITH DIFFERENTIAL/PLATELET
Basophils Absolute: 0.1 10*3/uL (ref 0–0.1)
Basophils Relative: 1 %
Eosinophils Absolute: 0.1 10*3/uL (ref 0–0.7)
Eosinophils Relative: 2 %
HCT: 41.7 % (ref 35.0–47.0)
Hemoglobin: 14 g/dL (ref 12.0–16.0)
Lymphocytes Relative: 30 %
Lymphs Abs: 1.4 10*3/uL (ref 1.0–3.6)
MCH: 29 pg (ref 26.0–34.0)
MCHC: 33.6 g/dL (ref 32.0–36.0)
MCV: 86.4 fL (ref 80.0–100.0)
Monocytes Absolute: 0.3 10*3/uL (ref 0.2–0.9)
Monocytes Relative: 7 %
Neutro Abs: 2.8 10*3/uL (ref 1.4–6.5)
Neutrophils Relative %: 60 %
Platelets: 215 10*3/uL (ref 150–440)
RBC: 4.83 MIL/uL (ref 3.80–5.20)
RDW: 16.4 % — ABNORMAL HIGH (ref 11.5–14.5)
WBC: 4.7 10*3/uL (ref 3.6–11.0)

## 2015-06-16 MED ORDER — SODIUM CHLORIDE 0.9 % IV SOLN
8.0000 mg/kg | Freq: Once | INTRAVENOUS | Status: AC
  Administered 2015-06-16: 609 mg via INTRAVENOUS
  Filled 2015-06-16: qty 29

## 2015-06-16 MED ORDER — DOCETAXEL CHEMO INJECTION 160 MG/16ML
75.0000 mg/m2 | Freq: Once | INTRAVENOUS | Status: AC
  Administered 2015-06-16: 140 mg via INTRAVENOUS
  Filled 2015-06-16: qty 14

## 2015-06-16 MED ORDER — SODIUM CHLORIDE 0.9 % IV SOLN
400.5000 mg | Freq: Once | INTRAVENOUS | Status: AC
  Administered 2015-06-16: 400 mg via INTRAVENOUS
  Filled 2015-06-16: qty 40

## 2015-06-16 MED ORDER — SODIUM CHLORIDE 0.9 % IV SOLN
840.0000 mg | Freq: Once | INTRAVENOUS | Status: AC
  Administered 2015-06-16: 840 mg via INTRAVENOUS
  Filled 2015-06-16: qty 28

## 2015-06-16 MED ORDER — DIPHENHYDRAMINE HCL 25 MG PO CAPS
25.0000 mg | ORAL_CAPSULE | Freq: Once | ORAL | Status: AC
  Administered 2015-06-16: 25 mg via ORAL
  Filled 2015-06-16: qty 1

## 2015-06-16 MED ORDER — ACETAMINOPHEN 325 MG PO TABS
650.0000 mg | ORAL_TABLET | Freq: Once | ORAL | Status: AC
  Administered 2015-06-16: 650 mg via ORAL
  Filled 2015-06-16: qty 2

## 2015-06-16 MED ORDER — SODIUM CHLORIDE 0.9 % IV SOLN
Freq: Once | INTRAVENOUS | Status: AC
  Administered 2015-06-16: 10:00:00 via INTRAVENOUS
  Filled 2015-06-16: qty 1000

## 2015-06-16 MED ORDER — SODIUM CHLORIDE 0.9 % IV SOLN
10.0000 mg | Freq: Once | INTRAVENOUS | Status: AC
  Administered 2015-06-16: 10 mg via INTRAVENOUS
  Filled 2015-06-16: qty 1

## 2015-06-16 MED ORDER — HEPARIN SOD (PORK) LOCK FLUSH 100 UNIT/ML IV SOLN
500.0000 [IU] | Freq: Once | INTRAVENOUS | Status: AC | PRN
  Administered 2015-06-16: 500 [IU]
  Filled 2015-06-16: qty 5

## 2015-06-16 MED ORDER — SODIUM CHLORIDE 0.9% FLUSH
10.0000 mL | INTRAVENOUS | Status: DC | PRN
  Filled 2015-06-16: qty 10

## 2015-06-16 MED ORDER — PALONOSETRON HCL INJECTION 0.25 MG/5ML
0.2500 mg | Freq: Once | INTRAVENOUS | Status: AC
  Administered 2015-06-16: 0.25 mg via INTRAVENOUS
  Filled 2015-06-16: qty 5

## 2015-06-18 ENCOUNTER — Ambulatory Visit: Payer: Medicare Other | Admitting: General Surgery

## 2015-06-23 ENCOUNTER — Inpatient Hospital Stay (HOSPITAL_BASED_OUTPATIENT_CLINIC_OR_DEPARTMENT_OTHER): Payer: Medicare Other | Admitting: Oncology

## 2015-06-23 ENCOUNTER — Inpatient Hospital Stay: Payer: Medicare Other

## 2015-06-23 VITALS — BP 129/87 | HR 99 | Temp 97.4°F | Resp 16 | Wt 162.7 lb

## 2015-06-23 DIAGNOSIS — D709 Neutropenia, unspecified: Secondary | ICD-10-CM

## 2015-06-23 DIAGNOSIS — G4730 Sleep apnea, unspecified: Secondary | ICD-10-CM

## 2015-06-23 DIAGNOSIS — Z170 Estrogen receptor positive status [ER+]: Secondary | ICD-10-CM | POA: Diagnosis not present

## 2015-06-23 DIAGNOSIS — Z79899 Other long term (current) drug therapy: Secondary | ICD-10-CM

## 2015-06-23 DIAGNOSIS — C50912 Malignant neoplasm of unspecified site of left female breast: Secondary | ICD-10-CM

## 2015-06-23 DIAGNOSIS — R197 Diarrhea, unspecified: Secondary | ICD-10-CM

## 2015-06-23 DIAGNOSIS — Z8041 Family history of malignant neoplasm of ovary: Secondary | ICD-10-CM

## 2015-06-23 DIAGNOSIS — D649 Anemia, unspecified: Secondary | ICD-10-CM

## 2015-06-23 DIAGNOSIS — K121 Other forms of stomatitis: Secondary | ICD-10-CM | POA: Diagnosis not present

## 2015-06-23 DIAGNOSIS — Z803 Family history of malignant neoplasm of breast: Secondary | ICD-10-CM

## 2015-06-23 DIAGNOSIS — N644 Mastodynia: Secondary | ICD-10-CM

## 2015-06-23 DIAGNOSIS — F419 Anxiety disorder, unspecified: Secondary | ICD-10-CM

## 2015-06-23 DIAGNOSIS — K219 Gastro-esophageal reflux disease without esophagitis: Secondary | ICD-10-CM

## 2015-06-23 DIAGNOSIS — Z85828 Personal history of other malignant neoplasm of skin: Secondary | ICD-10-CM

## 2015-06-23 DIAGNOSIS — K449 Diaphragmatic hernia without obstruction or gangrene: Secondary | ICD-10-CM

## 2015-06-23 DIAGNOSIS — C50911 Malignant neoplasm of unspecified site of right female breast: Secondary | ICD-10-CM

## 2015-06-23 DIAGNOSIS — R030 Elevated blood-pressure reading, without diagnosis of hypertension: Secondary | ICD-10-CM

## 2015-06-23 LAB — COMPREHENSIVE METABOLIC PANEL
ALBUMIN: 3.9 g/dL (ref 3.5–5.0)
ALT: 37 U/L (ref 14–54)
AST: 28 U/L (ref 15–41)
Alkaline Phosphatase: 61 U/L (ref 38–126)
Anion gap: 4 — ABNORMAL LOW (ref 5–15)
BUN: 20 mg/dL (ref 6–20)
CO2: 28 mmol/L (ref 22–32)
CREATININE: 0.79 mg/dL (ref 0.44–1.00)
Calcium: 9.9 mg/dL (ref 8.9–10.3)
Chloride: 103 mmol/L (ref 101–111)
Glucose, Bld: 111 mg/dL — ABNORMAL HIGH (ref 65–99)
POTASSIUM: 4.3 mmol/L (ref 3.5–5.1)
Sodium: 135 mmol/L (ref 135–145)
TOTAL PROTEIN: 7 g/dL (ref 6.5–8.1)
Total Bilirubin: 1.1 mg/dL (ref 0.3–1.2)

## 2015-06-23 LAB — CBC WITH DIFFERENTIAL/PLATELET
BASOS ABS: 0 10*3/uL (ref 0–0.1)
Eosinophils Absolute: 0 10*3/uL (ref 0–0.7)
HCT: 40.8 % (ref 35.0–47.0)
Hemoglobin: 13.6 g/dL (ref 12.0–16.0)
Lymphocytes Relative: 69 %
Lymphs Abs: 0.9 10*3/uL — ABNORMAL LOW (ref 1.0–3.6)
MCH: 29.1 pg (ref 26.0–34.0)
MCHC: 33.4 g/dL (ref 32.0–36.0)
MCV: 87.2 fL (ref 80.0–100.0)
MONO ABS: 0 10*3/uL — AB (ref 0.2–0.9)
Monocytes Relative: 4 %
Neutro Abs: 0.3 10*3/uL — ABNORMAL LOW (ref 1.4–6.5)
Neutrophils Relative %: 23 %
PLATELETS: 175 10*3/uL (ref 150–440)
RBC: 4.68 MIL/uL (ref 3.80–5.20)
RDW: 15.5 % — AB (ref 11.5–14.5)
WBC: 1.2 10*3/uL — CL (ref 3.6–11.0)

## 2015-06-23 NOTE — Progress Notes (Signed)
[facility]  [phone] [phone]  ID: Stephanie Crosby OB: 07-11-1935  MR#: [id number]  WOE#:[id number]  [name], MD as PCP - General (Family Medicine)  CHIEF COMPLAINT:  Chief Complaint  Patient presents with  . Breast Cancer  . Chemotherapy    INTERVAL HISTORY: Patient returns to clinic today for further evaluation and to initiate cycle 1 of 6 of Taxotere, carboplatinum, Herceptin, and Perjeta.  She continues to be anxious, but otherwise feels well. She has no neurologic complaints. She denies any pain. She has a good appetite and denies weight loss. She has no chest pain or shortness of breath. She denies any nausea, vomiting, constipation, or diarrhea. She has no urinary complaints. Patient otherwise feels well and offers no further specific complaints.  REVIEW OF SYSTEMS:   Review of Systems  Constitutional: Negative for fever, weight loss and malaise/fatigue.  Respiratory: Negative.   Cardiovascular: Negative.   Musculoskeletal: Negative.   Neurological: Negative.   Psychiatric/Behavioral: The patient is nervous/anxious.     As per HPI. Otherwise, a complete review of systems is negatve.  PAST MEDICAL HISTORY: Past Medical History  Diagnosis Date  . Skin cancer 2011  . Back injury   . Hiatal hernia   . Anemia   . GERD (gastroesophageal reflux disease)   . Sleep apnea   . Breast cancer in situ 05/2015    Left  . Seasonal allergic rhinitis   . Gonalgia   . Breast cancer ([place])     PAST SURGICAL HISTORY: Past Surgical History  Procedure Laterality Date  . Appendectomy    . Cataract extraction, bilateral Bilateral 2012  . Colonoscopy    . Portacath placement Right 06/10/2015    Procedure: INSERTION PORT-A-CATH;  Surgeon: [name], MD;  Location: [facility] ORS;  Service: General;  Laterality: Right;    FAMILY HISTORY: Patient reports a maternal aunt with breast cancer as well as a female cousin  with breast cancer.  Family History  Problem Relation Age of Onset  . Breast cancer Neg Hx   . Dementia Mother   . Hyperlipidemia Mother   . Hypertension Mother   . Congestive Heart Failure Mother   . Heart disease Mother   . Aneurysm Father     abdominal  . Fibromyalgia Sister   . Heart disease Sister   . Cancer Maternal Uncle     brain  . Heart disease Maternal Uncle   . Ovarian cancer Maternal Grandmother   . Cancer Cousin     breast cancer       ADVANCED DIRECTIVES:    HEALTH MAINTENANCE: Social History  Substance Use Topics  . Smoking status: Never Smoker   . Smokeless tobacco: Never Used  . Alcohol Use: 0.0 oz/week    0 Standard drinks or equivalent per week     Comment: 3 glasses wine/week     Colonoscopy:  PAP:  Bone density:  Lipid panel:  Allergies  Allergen Reactions  . Penicillins Rash    Current Outpatient Prescriptions  Medication Sig Dispense Refill  . ferrous fumarate (HEMOCYTE - 106 MG FE) 325 (106 Fe) MG TABS tablet Take 1 tablet by mouth. Reported on 06/15/2015    . fluorometholone (FML) 0.1 % ophthalmic suspension 1 drop as needed.    . Multiple Vitamins-Minerals (CENTRUM SILVER PO) Take by mouth. Reported on 06/15/2015    . omeprazole (PRILOSEC) 20 MG capsule Take 1 capsule by mouth daily. Reported on 06/15/2015    .  ondansetron (ZOFRAN) 8 MG tablet Take 1 tablet by mouth 3 (three) times daily as needed. Reported on 06/15/2015    . valACYclovir (VALTREX) 500 MG tablet Take by mouth as needed. Reported on 06/15/2015    . lidocaine-prilocaine (EMLA) cream Apply topically once.     No current facility-administered medications for this visit.   Facility-Administered Medications Ordered in Other Visits  Medication Dose Route Frequency Provider Last Rate Last Dose  . sodium chloride flush (NS) 0.9 % injection 10 mL  10 mL Intracatheter PRN Lloyd Huger, MD        OBJECTIVE: Filed Vitals:   06/16/15 0919  BP: 140/87  Pulse: 87  Temp:  97.4 F (36.3 C)     Body mass index is 25.7 kg/(m^2).    ECOG FS:0 - Asymptomatic  General: Well-developed, well-nourished, no acute distress. Eyes: Pink conjunctiva, anicteric sclera. Breasts: Patient requested exam be deferred today. Lungs: Clear to auscultation bilaterally. Heart: Regular rate and rhythm. No rubs, murmurs, or gallops. Abdomen: Soft, nontender, nondistended. No organomegaly noted, normoactive bowel sounds. Musculoskeletal: No edema, cyanosis, or clubbing. Neuro: Alert, answering all questions appropriately. Cranial nerves grossly intact. Skin: No rashes or petechiae noted. Psych: Normal affect.   LAB RESULTS:  Lab Results  Component Value Date   NA 135 06/23/2015   K 4.3 06/23/2015   CL 103 06/23/2015   CO2 28 06/23/2015   GLUCOSE 111* 06/23/2015   BUN 20 06/23/2015   CREATININE 0.79 06/23/2015   CALCIUM 9.9 06/23/2015   PROT 7.0 06/23/2015   ALBUMIN 3.9 06/23/2015   AST 28 06/23/2015   ALT 37 06/23/2015   ALKPHOS 61 06/23/2015   BILITOT 1.1 06/23/2015   GFRNONAA >60 06/23/2015   GFRAA >60 06/23/2015    Lab Results  Component Value Date   WBC 1.2* 06/23/2015   NEUTROABS 0.3* 06/23/2015   HGB 13.6 06/23/2015   HCT 40.8 06/23/2015   MCV 87.2 06/23/2015   PLT 175 06/23/2015     STUDIES: Nm Cardiac Muga Rest  06/09/2015  CLINICAL DATA:  Pre chemotherapy EXAM: NUCLEAR MEDICINE CARDIAC BLOOD POOL IMAGING (MUGA) TECHNIQUE: Cardiac multi-gated acquisition was performed at rest following intravenous injection of Tc-62mlabeled red blood cells. RADIOPHARMACEUTICALS:  19.96 mCi Tc-919mDP in-vitro labeled red blood cells IV COMPARISON:  None FINDINGS: The left ventricular ejection fraction is equal to 66.8%. Normal left ventricular wall motion and thickening. IMPRESSION: 1. Normal left ventricular ejection fraction equal to 66.8%. Electronically Signed   By: TaKerby Moors.D.   On: 06/09/2015 13:59   Dg Chest Port 1 View  06/10/2015  CLINICAL DATA:   Port-A-Cath insertion EXAM: PORTABLE CHEST 1 VIEW COMPARISON:  CT chest 05/20/2015 FINDINGS: There is a right-sided Port-A-Cath with the tip projecting over the cavoatrial junction. There is bilateral chronic interstitial thickening. There is no focal parenchymal opacity. There is no pleural effusion or pneumothorax. The heart and mediastinal contours are unremarkable. There is a large hiatal hernia. The osseous structures are unremarkable. IMPRESSION: Right-sided Port-A-Cath with the tip projecting over the cavoatrial junction. Electronically Signed   By: HeKathreen Devoid On: 06/10/2015 12:37   Mm Digital Diagnostic Unilat R  06/05/2015  CLINICAL DATA:  Post MRI guided biopsy of a small enhancing mass in the upper slightly outer right breast. EXAM: DIAGNOSTIC RIGHT MAMMOGRAM POST MRI BIOPSY COMPARISON:  Previous exam(s). FINDINGS: Mammographic images were obtained following MRI guided biopsy of a small enhancing mass in the upper slightly outer right breast. A dumbbell  shaped biopsy marking clip is present in the targeted region of the biopsied right breast mass. A small hematoma is present at the biopsy site. IMPRESSION: Dumbbell shaped biopsy marking clip present in in the upper slightly outer right breast at site of MRI guided breast biopsy. Final Assessment: Post Procedure Mammograms for Marker Placement Electronically Signed   By: Everlean Alstrom M.D.   On: 06/05/2015 09:28   Dg C-arm 1-60 Min-no Report  06/10/2015  CLINICAL DATA: Port placement C-ARM 1-60 MINUTES Fluoroscopy was utilized by the requesting physician.  No radiographic interpretation.   Mr Rt Breast Bx Johnella Moloney Dev 1st Lesion Image Bx Spec Mr Guide  06/09/2015  ADDENDUM REPORT: 06/08/2015 14:23 ADDENDUM: Pathology revealed GRADE I INVASIVE DUCTAL CARCINOMA, ASSOCIATED MICROCALCIFICATIONS of the upper outer quadrant of the Right breast. This was found to be concordant by Dr. Everlean Alstrom. Pathology results were discussed with Dr. Christia Reading  [place]negan's CMA, Duwayne Heck, by telephone. The patient has a recent diagnosis of Left breast cancer and is being treated by Dr. Grayland Ormond. Dr. Grayland Ormond from [facility] will bring the patient in to discuss right breast biopsy results and treatment plan in person. Duwayne Heck, CMA, was encouraged to call [facility] for any additional questions or assistance regarding the patient's care. Pathology results reported by Terie Purser, RN on 06/08/2015. Electronically Signed   By: Everlean Alstrom M.D.   On: 06/08/2015 14:23  06/09/2015  CLINICAL DATA:  80 year old female with an enhancing nodule seen on recent MRI in the slightly upper slightly and central/slightly outer right breast. Patient has a recent diagnosis of invasive lobular carcinoma of the left breast. EXAM: MRI GUIDED CORE NEEDLE BIOPSY OF THE RIGHT BREAST TECHNIQUE: Multiplanar, multisequence MR imaging of the right breast was performed both before and after administration of intravenous contrast. CONTRAST:  69m MULTIHANCE GADOBENATE DIMEGLUMINE 529 MG/ML IV SOLN COMPARISON:  Previous exams. FINDINGS: I met with the patient, and we discussed the procedure of MRI guided biopsy, including risks, benefits, and alternatives. Specifically, we discussed the risks of infection, bleeding, tissue injury, clip migration, and inadequate sampling. Informed, written consent was given. The usual time out protocol was performed immediately prior to the procedure. Using sterile technique, 1% Lidocaine, MRI guidance, and a 9 gauge vacuum assisted device, biopsy was performed of the enhancing mass in the upper slightly outer right breast using a lateral to medial approach. At the conclusion of the procedure, a dumbbell shaped tissue marker clip was deployed into the biopsy cavity. Follow-up 2-view mammogram was performed and dictated separately. IMPRESSION: MRI guided biopsy of the small enhancing mass in the upper slightly  outer right breast. No apparent complications. Electronically Signed: By: JEverlean AlstromM.D. On: 06/05/2015 09:28    ASSESSMENT:   1. Clinical stage IIb ER positive, PR negative, HER-2 overexpressing invasive lobular cancer of the left breast. 2. Clinical stage Ia ER/PR positive, HER-2 negative invasive ductal carcinoma of the right breast.  PLAN:    1. Left breast cancer: The vast majority of invasive lobular breast cancers are HER-2 negative. Case discussed with pathology and patient's final pathology is accurate although unusual. MRI results reviewed independently and reported as above with a larger lesion than anticipated based on mammography study. CT scan of the chest, abdomen, and pelvis revealed a 5 mm pulmonary nodule too small to characterize as well as a lesion in her L4 vertebrae that appears to be benign. Typically patients with invasive lobular cancer that  are HER-2 positive are ER positive and PR negative as is the case here. Given the size of her tumor and that she is  HER-2 positive, she will benefit from neoadjuvant chemotherapy. Pretreatment MUGA scan revealed an EF of 66.8%.  Proceed with cycle 1 of 6 of neoadjuvant Taxotere, carboplatinum, Herceptin, and Perjeta. She will also benefit from an aromatase inhibitor at the conclusion of her treatments. Patient will likely require XRT even after mastectomy given the size of her initial tumor. Return to clinic in 2 days for Neulasta, in 1 week for further evaluation, and then in 3 weeks for consideration of cycle 2. 2. Right breast cancer: Confirmed by MRI guided biopsy. Chemotherapy as above. Patient will likely require lumpectomy followed by XRT of her right breast. Aromatase inhibitor for 5 years as above. 3. Elevated blood pressure: Likely secondary to anxiety. Monitor.   Patient expressed understanding and was in agreement with this plan. She also understands that She can call clinic at any time with any questions, concerns, or  complaints.    Lloyd Huger, MD   06/23/2015 12:47 PM

## 2015-06-23 NOTE — Progress Notes (Signed)
After treatment patient had 2 days of diarrhea that was controlled with Imodium OTC.  She is having pain that feels like a burning/stinging sensation in the right breast.  Having blood tinged drainage from nose in the am and also has a sore throat in the mornings.  Has noticed for the last 2 days she is feeling a little more fatigued and needing more rest.

## 2015-06-23 NOTE — Progress Notes (Signed)
[place]  [phone] [phone]) [phone]  ID: [name] OB: [birth year]  MR#: [id number]  YDX#:[id number]  [name], MD as PCP - General (Family Medicine)  CHIEF COMPLAINT:  Chief Complaint  Patient presents with  . Breast Cancer    INTERVAL HISTORY: Patient returns to clinic today for further evaluation after her first cycle of Taxotere, carboplatinum, Herceptin, and Perjeta.  She tolerated the infusion and other than some fatigue she feels well. She did have some diarrhea once about 3 days post chemo but took imodium and she has not had another episode. She does have a sore throat and tongue which she has biotene mouthwash that she is using to see if it helps. She has bruising on her right breast left from her biopsy which she states burns and stings sometimes still. She has no neurologic complaints. She denies any pain. She has a good appetite and denies weight loss. She has no chest pain or shortness of breath. She denies any nausea, vomiting, constipation or diarrhea. She has no urinary complaints. Patient otherwise feels well and offers no further specific complaints.  REVIEW OF SYSTEMS:   Review of Systems  Constitutional: Positive for malaise/fatigue. Negative for fever and weight loss.  HENT: Positive for sore throat.   Respiratory: Negative.   Cardiovascular: Negative.   Gastrointestinal: Positive for diarrhea.  Musculoskeletal: Negative.   Skin: Negative.   Neurological: Positive for weakness.  Psychiatric/Behavioral: Negative.     As per HPI. Otherwise, a complete review of systems is negatve.  PAST MEDICAL HISTORY: Past Medical History  Diagnosis Date  . Skin cancer 2011  . Back injury   . Hiatal hernia   . Anemia   . GERD (gastroesophageal reflux disease)   . Sleep apnea   . Breast cancer in situ 05/2015    Left  . Seasonal allergic rhinitis   . Gonalgia   . Breast cancer ([place])     PAST  SURGICAL HISTORY: Past Surgical History  Procedure Laterality Date  . Appendectomy    . Cataract extraction, bilateral Bilateral 2012  . Colonoscopy    . Portacath placement Right 06/10/2015    Procedure: INSERTION PORT-A-CATH;  Surgeon: [name], MD;  Location: [facility] ORS;  Service: General;  Laterality: Right;    FAMILY HISTORY: Patient reports a maternal aunt with breast cancer as well as a female cousin with breast cancer.  Family History  Problem Relation Age of Onset  . Breast cancer Neg Hx   . Dementia Mother   . Hyperlipidemia Mother   . Hypertension Mother   . Congestive Heart Failure Mother   . Heart disease Mother   . Aneurysm Father     abdominal  . Fibromyalgia Sister   . Heart disease Sister   . Cancer Maternal Uncle     brain  . Heart disease Maternal Uncle   . Ovarian cancer Maternal Grandmother   . Cancer Cousin     breast cancer       ADVANCED DIRECTIVES:    HEALTH MAINTENANCE: Social History  Substance Use Topics  . Smoking status: Never Smoker   . Smokeless tobacco: Never Used  . Alcohol Use: 0.0 oz/week    0 Standard drinks or equivalent per week     Comment: 3 glasses wine/week     Colonoscopy:  PAP:  Bone density:  Lipid panel:  Allergies  Allergen Reactions  . Penicillins Rash    Current Outpatient Prescriptions  Medication Sig  Dispense Refill  . ferrous fumarate (HEMOCYTE - 106 MG FE) 325 (106 Fe) MG TABS tablet Take 1 tablet by mouth. Reported on 06/15/2015    . fluorometholone (FML) 0.1 % ophthalmic suspension 1 drop as needed.    . lidocaine-prilocaine (EMLA) cream Apply topically once.    . Multiple Vitamins-Minerals (CENTRUM SILVER PO) Take by mouth. Reported on 06/15/2015    . omeprazole (PRILOSEC) 20 MG capsule Take 1 capsule by mouth daily. Reported on 06/15/2015    . ondansetron (ZOFRAN) 8 MG tablet Take 1 tablet by mouth 3 (three) times daily as needed. Reported on 06/15/2015    . valACYclovir (VALTREX) 500 MG tablet Take  by mouth as needed. Reported on 06/15/2015     No current facility-administered medications for this visit.   Facility-Administered Medications Ordered in Other Visits  Medication Dose Route Frequency Provider Last Rate Last Dose  . sodium chloride flush (NS) 0.9 % injection 10 mL  10 mL Intracatheter PRN Lloyd Huger, MD        OBJECTIVE: Filed Vitals:   06/23/15 1111  BP: 129/87  Pulse: 99  Temp: 97.4 F (36.3 C)  Resp: 16     Body mass index is 25.48 kg/(m^2).    ECOG FS:0 - Asymptomatic  General: Well-developed, well-nourished, no acute distress. Eyes: Pink conjunctiva, anicteric sclera. Breasts: Exam be deferred today. Lungs: Clear to auscultation bilaterally. Heart: Regular rate and rhythm. No rubs, murmurs, or gallops. Abdomen: Soft, nontender, nondistended. No organomegaly noted, normoactive bowel sounds. Musculoskeletal: No edema, cyanosis, or clubbing. Neuro: Alert, answering all questions appropriately. Cranial nerves grossly intact. Skin: ecchymosis right breast. Psych: Normal affect.   LAB RESULTS:  Lab Results  Component Value Date   NA 135 06/23/2015   K 4.3 06/23/2015   CL 103 06/23/2015   CO2 28 06/23/2015   GLUCOSE 111* 06/23/2015   BUN 20 06/23/2015   CREATININE 0.79 06/23/2015   CALCIUM 9.9 06/23/2015   PROT 7.0 06/23/2015   ALBUMIN 3.9 06/23/2015   AST 28 06/23/2015   ALT 37 06/23/2015   ALKPHOS 61 06/23/2015   BILITOT 1.1 06/23/2015   GFRNONAA >60 06/23/2015   GFRAA >60 06/23/2015    Lab Results  Component Value Date   WBC 1.2* 06/23/2015   NEUTROABS 0.3* 06/23/2015   HGB 13.6 06/23/2015   HCT 40.8 06/23/2015   MCV 87.2 06/23/2015   PLT 175 06/23/2015     STUDIES: Nm Cardiac Muga Rest  06/09/2015  CLINICAL DATA:  Pre chemotherapy EXAM: NUCLEAR MEDICINE CARDIAC BLOOD POOL IMAGING (MUGA) TECHNIQUE: Cardiac multi-gated acquisition was performed at rest following intravenous injection of Tc-47mlabeled red blood cells.  RADIOPHARMACEUTICALS:  19.96 mCi Tc-970mDP in-vitro labeled red blood cells IV COMPARISON:  None FINDINGS: The left ventricular ejection fraction is equal to 66.8%. Normal left ventricular wall motion and thickening. IMPRESSION: 1. Normal left ventricular ejection fraction equal to 66.8%. Electronically Signed   By: TaKerby Moors.D.   On: 06/09/2015 13:59   Dg Chest Port 1 View  06/10/2015  CLINICAL DATA:  Port-A-Cath insertion EXAM: PORTABLE CHEST 1 VIEW COMPARISON:  CT chest 05/20/2015 FINDINGS: There is a right-sided Port-A-Cath with the tip projecting over the cavoatrial junction. There is bilateral chronic interstitial thickening. There is no focal parenchymal opacity. There is no pleural effusion or pneumothorax. The heart and mediastinal contours are unremarkable. There is a large hiatal hernia. The osseous structures are unremarkable. IMPRESSION: Right-sided Port-A-Cath with the tip projecting over the cavoatrial junction. Electronically  Signed   By: Kathreen Devoid   On: 06/10/2015 12:37   Mm Digital Diagnostic Unilat R  06/05/2015  CLINICAL DATA:  Post MRI guided biopsy of a small enhancing mass in the upper slightly outer right breast. EXAM: DIAGNOSTIC RIGHT MAMMOGRAM POST MRI BIOPSY COMPARISON:  Previous exam(s). FINDINGS: Mammographic images were obtained following MRI guided biopsy of a small enhancing mass in the upper slightly outer right breast. A dumbbell shaped biopsy marking clip is present in the targeted region of the biopsied right breast mass. A small hematoma is present at the biopsy site. IMPRESSION: Dumbbell shaped biopsy marking clip present in in the upper slightly outer right breast at site of MRI guided breast biopsy. Final Assessment: Post Procedure Mammograms for Marker Placement Electronically Signed   By: Everlean Alstrom M.D.   On: 06/05/2015 09:28   Dg C-arm 1-60 Min-no Report  06/10/2015  CLINICAL DATA: Port placement C-ARM 1-60 MINUTES Fluoroscopy was utilized by the  requesting physician.  No radiographic interpretation.   Mr Rt Breast Bx Johnella Moloney Dev 1st Lesion Image Bx Spec Mr Guide  06/09/2015  ADDENDUM REPORT: 06/08/2015 14:23 ADDENDUM: Pathology revealed GRADE I INVASIVE DUCTAL CARCINOMA, ASSOCIATED MICROCALCIFICATIONS of the upper outer quadrant of the Right breast. This was found to be concordant by Dr. Everlean Alstrom. Pathology results were discussed with Dr. Christia Reading Finnegan's CMA, Duwayne Heck, by telephone. The patient has a recent diagnosis of Left breast cancer and is being treated by Dr. Grayland Ormond. Dr. Grayland Ormond from [facility] will bring the patient in to discuss right breast biopsy results and treatment plan in person. Duwayne Heck, CMA, was encouraged to call [facility] for any additional questions or assistance regarding the patient's care. Pathology results reported by Terie Purser, RN on 06/08/2015. Electronically Signed   By: Everlean Alstrom M.D.   On: 06/08/2015 14:23  06/09/2015  CLINICAL DATA:  80 year old female with an enhancing nodule seen on recent MRI in the slightly upper slightly and central/slightly outer right breast. Patient has a recent diagnosis of invasive lobular carcinoma of the left breast. EXAM: MRI GUIDED CORE NEEDLE BIOPSY OF THE RIGHT BREAST TECHNIQUE: Multiplanar, multisequence MR imaging of the right breast was performed both before and after administration of intravenous contrast. CONTRAST:  40m MULTIHANCE GADOBENATE DIMEGLUMINE 529 MG/ML IV SOLN COMPARISON:  Previous exams. FINDINGS: I met with the patient, and we discussed the procedure of MRI guided biopsy, including risks, benefits, and alternatives. Specifically, we discussed the risks of infection, bleeding, tissue injury, clip migration, and inadequate sampling. Informed, written consent was given. The usual time out protocol was performed immediately prior to the procedure. Using sterile technique, 1% Lidocaine, MRI guidance,  and a 9 gauge vacuum assisted device, biopsy was performed of the enhancing mass in the upper slightly outer right breast using a lateral to medial approach. At the conclusion of the procedure, a dumbbell shaped tissue marker clip was deployed into the biopsy cavity. Follow-up 2-view mammogram was performed and dictated separately. IMPRESSION: MRI guided biopsy of the small enhancing mass in the upper slightly outer right breast. No apparent complications. Electronically Signed: By: JEverlean AlstromM.D. On: 06/05/2015 09:28    ASSESSMENT:   1. Clinical stage IIb ER positive, PR negative, HER-2 overexpressing invasive lobular cancer of the left breast. 2. Clinical stage Ia ER/PR positive, HER-2 negative invasive ductal carcinoma of the right breast.  PLAN:    1. Left breast cancer: The vast majority of invasive  lobular breast cancers are HER-2 negative. Case discussed with pathology and patient's final pathology is accurate although unusual. MRI results reviewed independently and reported as above with a larger lesion than anticipated based on mammography study. CT scan of the chest, abdomen, and pelvis revealed a 5 mm pulmonary nodule too small to characterize as well as a lesion in her L4 vertebrae that appears to be benign. Typically patients with invasive lobular cancer that are HER-2 positive are ER positive and PR negative as is the case here. Given the size of her tumor and that she is  HER-2 positive, she will benefit from neoadjuvant chemotherapy. Pretreatment MUGA scan revealed an EF of 66.8%. She will also benefit from an aromatase inhibitor at the conclusion of her treatments. Patient will likely require XRT even after mastectomy given the size of her initial tumor. Return to clinic in 2 weeks for consideration of cycle 2 Taxotere. Carboplatinum, Herceptin, Perjetta and then for Neulasta 2 days post treatment.  2. Right breast cancer: Confirmed by MRI guided biopsy. Chemotherapy as above.  Patient will likely require lumpectomy followed by XRT of her right breast. Aromatase inhibitor for 5 years as above. 3. Elevated blood pressure: Resolved. Likely secondary to anxiety. Monitor. 4. Right Breast Pain: Monitor. Bruising left from biopsy. 5. Diarrhea: Continue Imodium as needed. 6. Sore throat/tongue: Offered Dukes mouthwash. Patient will call if she wishes to try it.  7. Neutropenia: Neulasta ordered but didn't get administered. Patient states she wasn't told to return for injection. Neulasta to be given with each remaining treatment going further.  Patient expressed understanding and was in agreement with this plan. She also understands that She can call clinic at any time with any questions, concerns, or complaints.    Mayra Reel, NP   06/23/2015 1:24 PM  Patient seen and evaluated independently and I agree with the assessment and plan as dictated above.  Lloyd Huger, MD 06/27/2015 7:41 AM

## 2015-06-30 ENCOUNTER — Ambulatory Visit: Payer: Medicare Other | Admitting: Oncology

## 2015-06-30 ENCOUNTER — Other Ambulatory Visit: Payer: Medicare Other

## 2015-06-30 ENCOUNTER — Ambulatory Visit: Payer: Medicare Other

## 2015-07-07 ENCOUNTER — Inpatient Hospital Stay: Payer: Medicare Other | Attending: Oncology | Admitting: Oncology

## 2015-07-07 ENCOUNTER — Inpatient Hospital Stay: Payer: Medicare Other

## 2015-07-07 VITALS — BP 142/85 | HR 87 | Temp 96.9°F | Resp 18 | Wt 164.0 lb

## 2015-07-07 DIAGNOSIS — Z85828 Personal history of other malignant neoplasm of skin: Secondary | ICD-10-CM | POA: Insufficient documentation

## 2015-07-07 DIAGNOSIS — K219 Gastro-esophageal reflux disease without esophagitis: Secondary | ICD-10-CM | POA: Diagnosis not present

## 2015-07-07 DIAGNOSIS — G893 Neoplasm related pain (acute) (chronic): Secondary | ICD-10-CM | POA: Insufficient documentation

## 2015-07-07 DIAGNOSIS — Z7689 Persons encountering health services in other specified circumstances: Secondary | ICD-10-CM | POA: Insufficient documentation

## 2015-07-07 DIAGNOSIS — R918 Other nonspecific abnormal finding of lung field: Secondary | ICD-10-CM | POA: Insufficient documentation

## 2015-07-07 DIAGNOSIS — G4730 Sleep apnea, unspecified: Secondary | ICD-10-CM | POA: Diagnosis not present

## 2015-07-07 DIAGNOSIS — K449 Diaphragmatic hernia without obstruction or gangrene: Secondary | ICD-10-CM | POA: Insufficient documentation

## 2015-07-07 DIAGNOSIS — Z808 Family history of malignant neoplasm of other organs or systems: Secondary | ICD-10-CM | POA: Insufficient documentation

## 2015-07-07 DIAGNOSIS — Z803 Family history of malignant neoplasm of breast: Secondary | ICD-10-CM | POA: Diagnosis not present

## 2015-07-07 DIAGNOSIS — Z79899 Other long term (current) drug therapy: Secondary | ICD-10-CM

## 2015-07-07 DIAGNOSIS — R197 Diarrhea, unspecified: Secondary | ICD-10-CM | POA: Diagnosis not present

## 2015-07-07 DIAGNOSIS — K1370 Unspecified lesions of oral mucosa: Secondary | ICD-10-CM | POA: Insufficient documentation

## 2015-07-07 DIAGNOSIS — J029 Acute pharyngitis, unspecified: Secondary | ICD-10-CM | POA: Insufficient documentation

## 2015-07-07 DIAGNOSIS — Z170 Estrogen receptor positive status [ER+]: Secondary | ICD-10-CM | POA: Insufficient documentation

## 2015-07-07 DIAGNOSIS — C50911 Malignant neoplasm of unspecified site of right female breast: Secondary | ICD-10-CM | POA: Diagnosis not present

## 2015-07-07 DIAGNOSIS — C50912 Malignant neoplasm of unspecified site of left female breast: Secondary | ICD-10-CM | POA: Diagnosis not present

## 2015-07-07 DIAGNOSIS — Z5112 Encounter for antineoplastic immunotherapy: Secondary | ICD-10-CM | POA: Insufficient documentation

## 2015-07-07 DIAGNOSIS — Z5111 Encounter for antineoplastic chemotherapy: Secondary | ICD-10-CM | POA: Diagnosis not present

## 2015-07-07 LAB — CBC WITH DIFFERENTIAL/PLATELET
Basophils Absolute: 0.1 10*3/uL (ref 0–0.1)
Basophils Relative: 1 %
EOS PCT: 0 %
Eosinophils Absolute: 0 10*3/uL (ref 0–0.7)
HCT: 37.2 % (ref 35.0–47.0)
Hemoglobin: 12.5 g/dL (ref 12.0–16.0)
LYMPHS ABS: 1.3 10*3/uL (ref 1.0–3.6)
LYMPHS PCT: 22 %
MCH: 29.1 pg (ref 26.0–34.0)
MCHC: 33.6 g/dL (ref 32.0–36.0)
MCV: 86.8 fL (ref 80.0–100.0)
MONO ABS: 0.5 10*3/uL (ref 0.2–0.9)
MONOS PCT: 8 %
Neutro Abs: 4 10*3/uL (ref 1.4–6.5)
Neutrophils Relative %: 69 %
PLATELETS: 213 10*3/uL (ref 150–440)
RBC: 4.29 MIL/uL (ref 3.80–5.20)
RDW: 15.7 % — AB (ref 11.5–14.5)
WBC: 5.8 10*3/uL (ref 3.6–11.0)

## 2015-07-07 LAB — COMPREHENSIVE METABOLIC PANEL
ALT: 26 U/L (ref 14–54)
AST: 24 U/L (ref 15–41)
Albumin: 3.5 g/dL (ref 3.5–5.0)
Alkaline Phosphatase: 61 U/L (ref 38–126)
Anion gap: 4 — ABNORMAL LOW (ref 5–15)
BUN: 25 mg/dL — ABNORMAL HIGH (ref 6–20)
CHLORIDE: 109 mmol/L (ref 101–111)
CO2: 24 mmol/L (ref 22–32)
CREATININE: 0.66 mg/dL (ref 0.44–1.00)
Calcium: 9.1 mg/dL (ref 8.9–10.3)
Glucose, Bld: 148 mg/dL — ABNORMAL HIGH (ref 65–99)
POTASSIUM: 3.6 mmol/L (ref 3.5–5.1)
Sodium: 137 mmol/L (ref 135–145)
Total Bilirubin: 0.7 mg/dL (ref 0.3–1.2)
Total Protein: 6.4 g/dL — ABNORMAL LOW (ref 6.5–8.1)

## 2015-07-07 MED ORDER — DOCETAXEL CHEMO INJECTION 160 MG/16ML
75.0000 mg/m2 | Freq: Once | INTRAVENOUS | Status: AC
  Administered 2015-07-07: 140 mg via INTRAVENOUS
  Filled 2015-07-07: qty 14

## 2015-07-07 MED ORDER — ACETAMINOPHEN 325 MG PO TABS
650.0000 mg | ORAL_TABLET | Freq: Once | ORAL | Status: AC
  Administered 2015-07-07: 650 mg via ORAL
  Filled 2015-07-07: qty 2

## 2015-07-07 MED ORDER — TRASTUZUMAB CHEMO INJECTION 440 MG
6.0000 mg/kg | Freq: Once | INTRAVENOUS | Status: AC
  Administered 2015-07-07: 462 mg via INTRAVENOUS
  Filled 2015-07-07: qty 22

## 2015-07-07 MED ORDER — SODIUM CHLORIDE 0.9% FLUSH
10.0000 mL | INTRAVENOUS | Status: DC | PRN
  Administered 2015-07-07: 10 mL via INTRAVENOUS
  Filled 2015-07-07: qty 10

## 2015-07-07 MED ORDER — DIPHENHYDRAMINE HCL 25 MG PO CAPS
25.0000 mg | ORAL_CAPSULE | Freq: Once | ORAL | Status: AC
Start: 2015-07-07 — End: 2015-07-07
  Administered 2015-07-07: 25 mg via ORAL
  Filled 2015-07-07: qty 1

## 2015-07-07 MED ORDER — DEXAMETHASONE SODIUM PHOSPHATE 100 MG/10ML IJ SOLN
10.0000 mg | Freq: Once | INTRAMUSCULAR | Status: AC
  Administered 2015-07-07: 10 mg via INTRAVENOUS
  Filled 2015-07-07: qty 1

## 2015-07-07 MED ORDER — HEPARIN SOD (PORK) LOCK FLUSH 100 UNIT/ML IV SOLN
500.0000 [IU] | Freq: Once | INTRAVENOUS | Status: AC
  Administered 2015-07-07: 500 [IU] via INTRAVENOUS
  Filled 2015-07-07: qty 5

## 2015-07-07 MED ORDER — SODIUM CHLORIDE 0.9 % IV SOLN
420.0000 mg | Freq: Once | INTRAVENOUS | Status: AC
  Administered 2015-07-07: 420 mg via INTRAVENOUS
  Filled 2015-07-07: qty 14

## 2015-07-07 MED ORDER — CARBOPLATIN CHEMO INJECTION 600 MG/60ML
400.5000 mg | Freq: Once | INTRAVENOUS | Status: AC
  Administered 2015-07-07: 400 mg via INTRAVENOUS
  Filled 2015-07-07: qty 40

## 2015-07-07 MED ORDER — PALONOSETRON HCL INJECTION 0.25 MG/5ML
0.2500 mg | Freq: Once | INTRAVENOUS | Status: AC
Start: 2015-07-07 — End: 2015-07-07
  Administered 2015-07-07: 0.25 mg via INTRAVENOUS
  Filled 2015-07-07: qty 5

## 2015-07-07 MED ORDER — SODIUM CHLORIDE 0.9 % IV SOLN
Freq: Once | INTRAVENOUS | Status: AC
  Administered 2015-07-07: 10:00:00 via INTRAVENOUS
  Filled 2015-07-07: qty 1000

## 2015-07-07 NOTE — Progress Notes (Signed)
Patient is starting to lose her hair.

## 2015-07-09 ENCOUNTER — Inpatient Hospital Stay: Payer: Medicare Other

## 2015-07-09 DIAGNOSIS — C50912 Malignant neoplasm of unspecified site of left female breast: Secondary | ICD-10-CM | POA: Diagnosis not present

## 2015-07-09 MED ORDER — PEGFILGRASTIM INJECTION 6 MG/0.6ML ~~LOC~~
6.0000 mg | PREFILLED_SYRINGE | Freq: Once | SUBCUTANEOUS | Status: AC
  Administered 2015-07-09: 6 mg via SUBCUTANEOUS

## 2015-07-12 NOTE — Progress Notes (Signed)
[place]  Telephone:(336) [phone] Fax:(336) [phone]  ID: Stephanie Crosby OB: April 20, 1936  MR#: [id number]  PJA#:[id number]  [name], MD as PCP - General (Family Medicine)  CHIEF COMPLAINT:  Chief Complaint  Patient presents with  . Breast Cancer    INTERVAL HISTORY: Patient returns to clinic today for further evaluation and consideration of cycle 2 of Taxotere, carboplatinum, Herceptin, and Perjeta.  She currently feels well and is asymptomatic. Her only complaint is of increased hair loss. She had some mild fatigue as well as diarrhea after her first infusion, but these have since resolved. She has no neurologic complaints. She denies any fevers. She denies any pain. She has a good appetite and denies weight loss. She has no chest pain or shortness of breath. She denies any nausea, vomiting, constipation or diarrhea. She has no urinary complaints. Patient offers no further specific complaints.  REVIEW OF SYSTEMS:   Review of Systems  Constitutional: Negative for fever, weight loss and malaise/fatigue.  HENT: Negative for sore throat.   Respiratory: Negative.   Cardiovascular: Negative.   Gastrointestinal: Negative.  Negative for diarrhea.  Musculoskeletal: Negative.   Skin: Negative.   Neurological: Negative.  Negative for weakness.  Psychiatric/Behavioral: Negative.     As per HPI. Otherwise, a complete review of systems is negatve.  PAST MEDICAL HISTORY: Past Medical History  Diagnosis Date  . Skin cancer 2011  . Back injury   . Hiatal hernia   . Anemia   . GERD (gastroesophageal reflux disease)   . Sleep apnea   . Breast cancer in situ 05/2015    Left  . Seasonal allergic rhinitis   . Gonalgia   . Breast cancer ([place])     PAST SURGICAL HISTORY: Past Surgical History  Procedure Laterality Date  . Appendectomy    . Cataract extraction, bilateral Bilateral 2012  . Colonoscopy    . Portacath placement Right 06/10/2015      Procedure: INSERTION PORT-A-CATH;  Surgeon: [name], MD;  Location: [facility] ORS;  Service: General;  Laterality: Right;    FAMILY HISTORY: Patient reports a maternal aunt with breast cancer as well as a female cousin with breast cancer.  Family History  Problem Relation Age of Onset  . Breast cancer Neg Hx   . Dementia Mother   . Hyperlipidemia Mother   . Hypertension Mother   . Congestive Heart Failure Mother   . Heart disease Mother   . Aneurysm Father     abdominal  . Fibromyalgia Sister   . Heart disease Sister   . Cancer Maternal Uncle     brain  . Heart disease Maternal Uncle   . Ovarian cancer Maternal Grandmother   . Cancer Cousin     breast cancer       ADVANCED DIRECTIVES:    HEALTH MAINTENANCE: Social History  Substance Use Topics  . Smoking status: Never Smoker   . Smokeless tobacco: Never Used  . Alcohol Use: 0.0 oz/week    0 Standard drinks or equivalent per week     Comment: 3 glasses wine/week     Colonoscopy:  PAP:  Bone density:  Lipid panel:  Allergies  Allergen Reactions  . Penicillins Rash    Current Outpatient Prescriptions  Medication Sig Dispense Refill  . ferrous fumarate (HEMOCYTE - 106 MG FE) 325 (106 Fe) MG TABS tablet Take 1 tablet by mouth. Reported on 06/15/2015    . fluorometholone (FML) 0.1 % ophthalmic suspension 1 drop  as needed.    . lidocaine-prilocaine (EMLA) cream Apply topically once.    . Multiple Vitamins-Minerals (CENTRUM SILVER PO) Take by mouth. Reported on 06/15/2015    . omeprazole (PRILOSEC) 20 MG capsule Take 1 capsule by mouth daily. Reported on 06/15/2015    . ondansetron (ZOFRAN) 8 MG tablet Take 1 tablet by mouth 3 (three) times daily as needed. Reported on 06/15/2015    . valACYclovir (VALTREX) 500 MG tablet Take by mouth as needed. Reported on 06/15/2015     No current facility-administered medications for this visit.   Facility-Administered Medications Ordered in Other Visits  Medication Dose Route  Frequency Provider Last Rate Last Dose  . sodium chloride flush (NS) 0.9 % injection 10 mL  10 mL Intracatheter PRN Lloyd Huger, MD        OBJECTIVE: Filed Vitals:   07/07/15 0932  BP: 142/85  Pulse: 87  Temp: 96.9 F (36.1 C)  Resp: 18     Body mass index is 25.68 kg/(m^2).    ECOG FS:0 - Asymptomatic  General: Well-developed, well-nourished, no acute distress. Eyes: Pink conjunctiva, anicteric sclera. Breasts: Exam be deferred today. Lungs: Clear to auscultation bilaterally. Heart: Regular rate and rhythm. No rubs, murmurs, or gallops. Abdomen: Soft, nontender, nondistended. No organomegaly noted, normoactive bowel sounds. Musculoskeletal: No edema, cyanosis, or clubbing. Neuro: Alert, answering all questions appropriately. Cranial nerves grossly intact. Skin: ecchymosis right breast. Psych: Normal affect.   LAB RESULTS:  Lab Results  Component Value Date   NA 137 07/07/2015   K 3.6 07/07/2015   CL 109 07/07/2015   CO2 24 07/07/2015   GLUCOSE 148* 07/07/2015   BUN 25* 07/07/2015   CREATININE 0.66 07/07/2015   CALCIUM 9.1 07/07/2015   PROT 6.4* 07/07/2015   ALBUMIN 3.5 07/07/2015   AST 24 07/07/2015   ALT 26 07/07/2015   ALKPHOS 61 07/07/2015   BILITOT 0.7 07/07/2015   GFRNONAA >60 07/07/2015   GFRAA >60 07/07/2015    Lab Results  Component Value Date   WBC 5.8 07/07/2015   NEUTROABS 4.0 07/07/2015   HGB 12.5 07/07/2015   HCT 37.2 07/07/2015   MCV 86.8 07/07/2015   PLT 213 07/07/2015     STUDIES: No results found.  ASSESSMENT:   1. Clinical stage IIb ER positive, PR negative, HER-2 overexpressing invasive lobular cancer of the left breast. 2. Clinical stage Ia ER/PR positive, HER-2 negative invasive ductal carcinoma of the right breast.  PLAN:    1. Left breast cancer: The vast majority of invasive lobular breast cancers are HER-2 negative. Case discussed with pathology and patient's final pathology is accurate although unusual. MRI results  reviewed independently and reported as above with a larger lesion than anticipated based on mammography study. CT scan of the chest, abdomen, and pelvis revealed a 5 mm pulmonary nodule too small to characterize as well as a lesion in her L4 vertebrae that appears to be benign. Typically patients with invasive lobular cancer that are HER-2 positive are ER positive and PR negative as is the case here. Given the size of her tumor and that she is HER-2 positive, she will benefit from neoadjuvant chemotherapy. Pretreatment MUGA scan revealed an EF of 66.8%. She will also benefit from an aromatase inhibitor at the conclusion of her treatments. Patient will likely require XRT even after mastectomy given the size of her initial tumor. Proceed with cycle 2 of Taxotere, Carboplatinum, Herceptin, Perjetta today. Return to clinic in 2 days for Neulasta and then in  3 weeks for consideration of cycle 3. 2. Right breast cancer: Confirmed by MRI guided biopsy. Chemotherapy as above. Patient will likely require lumpectomy followed by XRT of her right breast. Aromatase inhibitor for 5 years as above. 3. Elevated blood pressure: Improved. Likely secondary to anxiety. Monitor. 4. Right Breast Pain: Resolved. 5. Diarrhea: Continue Imodium as needed. 6. Sore throat/tongue: Offered Dukes mouthwash. Patient will call if she wishes to try it.  7. Neutropenia: Resolved. Patient will receive Neulasta with the remainder of her treatments.  Patient expressed understanding and was in agreement with this plan. She also understands that She can call clinic at any time with any questions, concerns, or complaints.    Lloyd Huger, MD   07/12/2015 4:35 PM

## 2015-07-14 ENCOUNTER — Inpatient Hospital Stay: Payer: Medicare Other

## 2015-07-14 DIAGNOSIS — C50912 Malignant neoplasm of unspecified site of left female breast: Secondary | ICD-10-CM | POA: Diagnosis not present

## 2015-07-14 LAB — CBC WITH DIFFERENTIAL/PLATELET
BASOS ABS: 0.1 10*3/uL (ref 0–0.1)
Basophils Relative: 1 %
EOS ABS: 0.1 10*3/uL (ref 0–0.7)
Eosinophils Relative: 1 %
HEMATOCRIT: 35.8 % (ref 35.0–47.0)
HEMOGLOBIN: 11.9 g/dL — AB (ref 12.0–16.0)
LYMPHS ABS: 1.8 10*3/uL (ref 1.0–3.6)
Lymphocytes Relative: 14 %
MCH: 29 pg (ref 26.0–34.0)
MCHC: 33.4 g/dL (ref 32.0–36.0)
MCV: 87.1 fL (ref 80.0–100.0)
Monocytes Absolute: 1.9 10*3/uL — ABNORMAL HIGH (ref 0.2–0.9)
Monocytes Relative: 14 %
NEUTROS ABS: 9.2 10*3/uL — AB (ref 1.4–6.5)
NEUTROS PCT: 70 %
Platelets: 97 10*3/uL — ABNORMAL LOW (ref 150–440)
RBC: 4.11 MIL/uL (ref 3.80–5.20)
RDW: 15.4 % — ABNORMAL HIGH (ref 11.5–14.5)
WBC: 13.1 10*3/uL — AB (ref 3.6–11.0)

## 2015-07-14 LAB — COMPREHENSIVE METABOLIC PANEL
ALBUMIN: 3.7 g/dL (ref 3.5–5.0)
ALK PHOS: 74 U/L (ref 38–126)
ALT: 20 U/L (ref 14–54)
AST: 28 U/L (ref 15–41)
Anion gap: 5 (ref 5–15)
BILIRUBIN TOTAL: 1.4 mg/dL — AB (ref 0.3–1.2)
BUN: 19 mg/dL (ref 6–20)
CALCIUM: 9.5 mg/dL (ref 8.9–10.3)
CO2: 27 mmol/L (ref 22–32)
CREATININE: 0.89 mg/dL (ref 0.44–1.00)
Chloride: 105 mmol/L (ref 101–111)
GFR calc Af Amer: >60 mL/min (ref >60)
GFR calc non Af Amer: >60 mL/min (ref >60)
GLUCOSE: 119 mg/dL — AB (ref 65–99)
POTASSIUM: 4 mmol/L (ref 3.5–5.1)
Sodium: 137 mmol/L (ref 135–145)
TOTAL PROTEIN: 6.5 g/dL (ref 6.5–8.1)

## 2015-07-28 ENCOUNTER — Inpatient Hospital Stay (HOSPITAL_BASED_OUTPATIENT_CLINIC_OR_DEPARTMENT_OTHER): Payer: Medicare Other | Admitting: Hematology and Oncology

## 2015-07-28 ENCOUNTER — Inpatient Hospital Stay: Payer: Medicare Other

## 2015-07-28 VITALS — BP 122/72 | HR 80 | Temp 97.0°F | Resp 18

## 2015-07-28 VITALS — BP 121/76 | HR 93 | Temp 97.1°F | Resp 18 | Wt 162.7 lb

## 2015-07-28 DIAGNOSIS — J029 Acute pharyngitis, unspecified: Secondary | ICD-10-CM

## 2015-07-28 DIAGNOSIS — Z85828 Personal history of other malignant neoplasm of skin: Secondary | ICD-10-CM

## 2015-07-28 DIAGNOSIS — K1370 Unspecified lesions of oral mucosa: Secondary | ICD-10-CM

## 2015-07-28 DIAGNOSIS — C50911 Malignant neoplasm of unspecified site of right female breast: Secondary | ICD-10-CM

## 2015-07-28 DIAGNOSIS — R197 Diarrhea, unspecified: Secondary | ICD-10-CM

## 2015-07-28 DIAGNOSIS — C50912 Malignant neoplasm of unspecified site of left female breast: Secondary | ICD-10-CM

## 2015-07-28 DIAGNOSIS — Z803 Family history of malignant neoplasm of breast: Secondary | ICD-10-CM

## 2015-07-28 DIAGNOSIS — G4730 Sleep apnea, unspecified: Secondary | ICD-10-CM

## 2015-07-28 DIAGNOSIS — G893 Neoplasm related pain (acute) (chronic): Secondary | ICD-10-CM

## 2015-07-28 DIAGNOSIS — Z808 Family history of malignant neoplasm of other organs or systems: Secondary | ICD-10-CM

## 2015-07-28 DIAGNOSIS — K219 Gastro-esophageal reflux disease without esophagitis: Secondary | ICD-10-CM

## 2015-07-28 DIAGNOSIS — K449 Diaphragmatic hernia without obstruction or gangrene: Secondary | ICD-10-CM

## 2015-07-28 DIAGNOSIS — Z170 Estrogen receptor positive status [ER+]: Secondary | ICD-10-CM

## 2015-07-28 DIAGNOSIS — Z7689 Persons encountering health services in other specified circumstances: Secondary | ICD-10-CM

## 2015-07-28 DIAGNOSIS — Z79899 Other long term (current) drug therapy: Secondary | ICD-10-CM

## 2015-07-28 DIAGNOSIS — R918 Other nonspecific abnormal finding of lung field: Secondary | ICD-10-CM

## 2015-07-28 LAB — COMPREHENSIVE METABOLIC PANEL
ALK PHOS: 79 U/L (ref 38–126)
ALT: 17 U/L (ref 14–54)
AST: 29 U/L (ref 15–41)
Albumin: 3.5 g/dL (ref 3.5–5.0)
Anion gap: 4 — ABNORMAL LOW (ref 5–15)
BUN: 20 mg/dL (ref 6–20)
CALCIUM: 9.1 mg/dL (ref 8.9–10.3)
CO2: 25 mmol/L (ref 22–32)
CREATININE: 0.83 mg/dL (ref 0.44–1.00)
Chloride: 107 mmol/L (ref 101–111)
Glucose, Bld: 164 mg/dL — ABNORMAL HIGH (ref 65–99)
Potassium: 3.7 mmol/L (ref 3.5–5.1)
Sodium: 136 mmol/L (ref 135–145)
Total Bilirubin: 0.8 mg/dL (ref 0.3–1.2)
Total Protein: 6.8 g/dL (ref 6.5–8.1)

## 2015-07-28 LAB — CBC WITH DIFFERENTIAL/PLATELET
BASOS ABS: 0.1 10*3/uL (ref 0–0.1)
BASOS PCT: 1 %
EOS ABS: 0 10*3/uL (ref 0–0.7)
EOS PCT: 0 %
HCT: 34.1 % — ABNORMAL LOW (ref 35.0–47.0)
Hemoglobin: 11.4 g/dL — ABNORMAL LOW (ref 12.0–16.0)
Lymphocytes Relative: 29 %
Lymphs Abs: 1.3 10*3/uL (ref 1.0–3.6)
MCH: 29.3 pg (ref 26.0–34.0)
MCHC: 33.4 g/dL (ref 32.0–36.0)
MCV: 87.9 fL (ref 80.0–100.0)
MONO ABS: 0.5 10*3/uL (ref 0.2–0.9)
Monocytes Relative: 11 %
Neutro Abs: 2.6 10*3/uL (ref 1.4–6.5)
Neutrophils Relative %: 59 %
PLATELETS: 364 10*3/uL (ref 150–440)
RBC: 3.88 MIL/uL (ref 3.80–5.20)
RDW: 16.7 % — AB (ref 11.5–14.5)
WBC: 4.5 10*3/uL (ref 3.6–11.0)

## 2015-07-28 MED ORDER — DOCETAXEL CHEMO INJECTION 160 MG/16ML
75.0000 mg/m2 | Freq: Once | INTRAVENOUS | Status: AC
  Administered 2015-07-28: 140 mg via INTRAVENOUS
  Filled 2015-07-28: qty 14

## 2015-07-28 MED ORDER — PALONOSETRON HCL INJECTION 0.25 MG/5ML
0.2500 mg | Freq: Once | INTRAVENOUS | Status: AC
  Administered 2015-07-28: 0.25 mg via INTRAVENOUS
  Filled 2015-07-28: qty 5

## 2015-07-28 MED ORDER — HEPARIN SOD (PORK) LOCK FLUSH 100 UNIT/ML IV SOLN
500.0000 [IU] | Freq: Once | INTRAVENOUS | Status: AC
  Filled 2015-07-28: qty 5

## 2015-07-28 MED ORDER — ACETAMINOPHEN 325 MG PO TABS
650.0000 mg | ORAL_TABLET | Freq: Once | ORAL | Status: AC
  Administered 2015-07-28: 650 mg via ORAL
  Filled 2015-07-28: qty 2

## 2015-07-28 MED ORDER — SODIUM CHLORIDE 0.9 % IV SOLN
6.0000 mg/kg | Freq: Once | INTRAVENOUS | Status: AC
  Administered 2015-07-28: 462 mg via INTRAVENOUS
  Filled 2015-07-28: qty 22

## 2015-07-28 MED ORDER — MAGIC MOUTHWASH
5.0000 mL | Freq: Four times a day (QID) | ORAL | Status: DC | PRN
Start: 2015-07-28 — End: 2015-10-27

## 2015-07-28 MED ORDER — HEPARIN SOD (PORK) LOCK FLUSH 100 UNIT/ML IV SOLN
500.0000 [IU] | Freq: Once | INTRAVENOUS | Status: AC | PRN
  Administered 2015-07-28: 500 [IU]

## 2015-07-28 MED ORDER — SODIUM CHLORIDE 0.9 % IV SOLN
400.5000 mg | Freq: Once | INTRAVENOUS | Status: AC
  Administered 2015-07-28: 400 mg via INTRAVENOUS
  Filled 2015-07-28: qty 40

## 2015-07-28 MED ORDER — PERTUZUMAB CHEMO INJECTION 420 MG/14ML
420.0000 mg | Freq: Once | INTRAVENOUS | Status: AC
  Administered 2015-07-28: 420 mg via INTRAVENOUS
  Filled 2015-07-28: qty 14

## 2015-07-28 MED ORDER — DIPHENHYDRAMINE HCL 25 MG PO CAPS
25.0000 mg | ORAL_CAPSULE | Freq: Once | ORAL | Status: AC
  Administered 2015-07-28: 25 mg via ORAL
  Filled 2015-07-28: qty 1

## 2015-07-28 MED ORDER — SODIUM CHLORIDE 0.9 % IV SOLN
Freq: Once | INTRAVENOUS | Status: AC
  Filled 2015-07-28: qty 1000

## 2015-07-28 MED ORDER — SODIUM CHLORIDE 0.9% FLUSH
10.0000 mL | INTRAVENOUS | Status: DC | PRN
  Administered 2015-07-28: 10 mL via INTRAVENOUS
  Filled 2015-07-28: qty 10

## 2015-07-28 MED ORDER — SODIUM CHLORIDE 0.9 % IV SOLN
Freq: Once | INTRAVENOUS | Status: AC
  Administered 2015-07-28: 11:00:00 via INTRAVENOUS
  Filled 2015-07-28: qty 1000

## 2015-07-28 MED ORDER — SODIUM CHLORIDE 0.9 % IV SOLN
10.0000 mg | Freq: Once | INTRAVENOUS | Status: AC
  Administered 2015-07-28: 10 mg via INTRAVENOUS
  Filled 2015-07-28: qty 1

## 2015-07-28 NOTE — Progress Notes (Signed)
[place]  [phone]) [phone] Fax:(336) [phone]  ID: Stephanie Crosby OB: Feb 27, 1936  MR#: [id number]  JJO#:[id number]  [name], MD as PCP - General (Family Medicine)  CHIEF COMPLAINT:  Chief Complaint  Patient presents with  . Breast Cancer    INTERVAL HISTORY: The patient was last seen in the medical oncology clinic on 07/07/2015 by Dr. Grayland Ormond.  At that time, she received cycle #2 [place] + Perjeta with Neulasta support.  At that time,her only complaint was increased hair loss.  She denied any neurologic complaints. Decision was made to proceed with chemotherapy followed by Neulasta support as cycle #1 was complicated by neutropenia ([place]).  During the interim, her appetite has been decreased.  She has been drinking protein drinks.  She describes mouth sores.  Energy level has been low.  She had some back pain post Neulasta. She used Naprosyn.  She notes some tearing, but chronic since before chemotherapy.    Review of Systems  Constitutional: Negative for fever, weight loss and malaise/fatigue.  HENT: Negative for sore throat.   Respiratory: Negative.   Cardiovascular: Negative.   Gastrointestinal: Negative.  Negative for diarrhea.  Musculoskeletal: Negative.   Skin: Negative.   Neurological: Negative.  Negative for weakness.  Psychiatric/Behavioral: Negative.      As per HPI. Otherwise, a complete review of systems is negatve.  PAST MEDICAL HISTORY: Past Medical History  Diagnosis Date  . Skin cancer 2011  . Back injury   . Hiatal hernia   . Anemia   . GERD (gastroesophageal reflux disease)   . Sleep apnea   . Breast cancer in situ 05/2015    Left  . Seasonal allergic rhinitis   . Gonalgia   . Breast cancer ([place])     PAST SURGICAL HISTORY: Past Surgical History  Procedure Laterality Date  . Appendectomy    . Cataract extraction, bilateral Bilateral 2012  . Colonoscopy    . Portacath placement Right  06/10/2015    Procedure: INSERTION PORT-A-CATH;  Surgeon: [name], MD;  Location: [facility] ORS;  Service: General;  Laterality: Right;    FAMILY HISTORY: Patient reports a maternal aunt with breast cancer as well as a female cousin with breast cancer.  Family History  Problem Relation Age of Onset  . Breast cancer Neg Hx   . Dementia Mother   . Hyperlipidemia Mother   . Hypertension Mother   . Congestive Heart Failure Mother   . Heart disease Mother   . Aneurysm Father     abdominal  . Fibromyalgia Sister   . Heart disease Sister   . Cancer Maternal Uncle     brain  . Heart disease Maternal Uncle   . Ovarian cancer Maternal Grandmother   . Cancer Cousin     breast cancer       ADVANCED DIRECTIVES:    HEALTH MAINTENANCE: Social History  Substance Use Topics  . Smoking status: Never Smoker   . Smokeless tobacco: Never Used  . Alcohol Use: 0.0 oz/week    0 Standard drinks or equivalent per week     Comment: 3 glasses wine/week     Colonoscopy:  PAP:  Bone density:  Lipid panel:  Allergies  Allergen Reactions  . Penicillins Rash    Current Outpatient Prescriptions  Medication Sig Dispense Refill  . ferrous fumarate (HEMOCYTE - 106 MG FE) 325 (106 Fe) MG TABS tablet Take 1 tablet by mouth. Reported on 06/15/2015    . fluorometholone (  FML) 0.1 % ophthalmic suspension 1 drop as needed.    . lidocaine-prilocaine (EMLA) cream Apply topically once.    . Multiple Vitamins-Minerals (CENTRUM SILVER PO) Take by mouth. Reported on 06/15/2015    . omeprazole (PRILOSEC) 20 MG capsule Take 1 capsule by mouth daily. Reported on 06/15/2015    . ondansetron (ZOFRAN) 8 MG tablet Take 1 tablet by mouth 3 (three) times daily as needed. Reported on 06/15/2015    . valACYclovir (VALTREX) 500 MG tablet Take by mouth as needed. Reported on 06/15/2015     No current facility-administered medications for this visit.   Facility-Administered Medications Ordered in Other Visits  Medication  Dose Route Frequency Provider Last Rate Last Dose  . heparin lock flush 100 unit/mL  500 Units Intravenous Once Lloyd Huger, MD      . sodium chloride flush (NS) 0.9 % injection 10 mL  10 mL Intracatheter PRN Lloyd Huger, MD      . sodium chloride flush (NS) 0.9 % injection 10 mL  10 mL Intravenous PRN Lloyd Huger, MD   10 mL at 07/28/15 0858    OBJECTIVE: Filed Vitals:   07/28/15 0927  BP: 121/76  Pulse: 93  Temp: 97.1 F (36.2 C)  Resp: 18     Body mass index is 25.48 kg/(m^2).    ECOG FS:0 - Asymptomatic  GENERAL:  Well developed, well nourished, woman sitting comfortably in the exam room in no acute distress. MENTAL STATUS:  Alert and oriented to person, place and time. HEAD:  Wearing a cap.  Normocephalic, atraumatic, face symmetric, no Cushingoid features. EYES:  Blue eyes.  Pupils equal round and reactive to light and accomodation.  No conjunctivitis or scleral icterus. ENT:  Oropharynx clear without lesion.  Tongue normal. Mucous membranes moist.  RESPIRATORY:  Clear to auscultation without rales, wheezes or rhonchi. CARDIOVASCULAR:  Regular rate and rhythm without murmur, rub or gallop. ABDOMEN:  Soft, non-tender, with active bowel sounds, and no hepatosplenomegaly.  No masses. SKIN:  Actinic keratosis.  No rashes, ulcers or lesions. EXTREMITIES: No edema, no skin discoloration or tenderness.  No palpable cords. LYMPH NODES: No palpable cervical, supraclavicular, axillary or inguinal adenopathy  NEUROLOGICAL: Unremarkable. PSYCH:  Appropriate.    SOCIAL HISTORY:  Th patient is accompanied by her husband, Sonia Side, and her daughter, Ebony Hail.   LAB RESULTS:  Lab Results  Component Value Date   NA 136 07/28/2015   K 3.7 07/28/2015   CL 107 07/28/2015   CO2 25 07/28/2015   GLUCOSE 164* 07/28/2015   BUN 20 07/28/2015   CREATININE 0.83 07/28/2015   CALCIUM 9.1 07/28/2015   PROT 6.8 07/28/2015   ALBUMIN 3.5 07/28/2015   AST 29 07/28/2015   ALT 17  07/28/2015   ALKPHOS 79 07/28/2015   BILITOT 0.8 07/28/2015   GFRNONAA >60 07/28/2015   GFRAA >60 07/28/2015    Lab Results  Component Value Date   WBC 4.5 07/28/2015   NEUTROABS 2.6 07/28/2015   HGB 11.4* 07/28/2015   HCT 34.1* 07/28/2015   MCV 87.9 07/28/2015   PLT 364 07/28/2015     STUDIES: No results found.  ASSESSMENT:   1. Clinical stage IIb ER positive, PR negative, HER-2 overexpressing invasive lobular cancer of the left breast. 2. Clinical stage Ia ER/PR positive, HER-2 negative invasive ductal carcinoma of the right breast.  PLAN:    1. Left breast cancer: She is s/p 2 cycles of Taxotere, carboplatin, Herceptin ([place] + Perjeta (last 07/07/2015) with  Neulasta support. Plan is for 6 cycles of neoadjuvant chemotherapy followed by surgery, radiation, and 5-10 years of an aromatase inhibitor.  Pretreatment MUGA revealed an EF of 67%.  Anticipate follow-up of 5 mm pulmonary nodule and L4 vertebral lesion at completion of chemotherapy.  Clinically, she is doing well.  Cycle #1 was complicated by neutropenia.  She had Neulasta induced bone pain after cycle #2.  Cycle #3 [place] + Perjeta today with Neulasta support 24 hours post chemotherapy.  Discuss use of Claritin for 7 days post chemotherapy to prevent bone pain.  Schedule MUGA scan in 2 1/2 weeks.  RTC in 3 weeks for MD assessment, labs (CBC with diff, CMP), and cycle #4 [place] + Perjeta.   2. Right breast cancer: Confirmed by MRI guided biopsy. Chemotherapy as above. Patient will require lumpectomy followed by XRT of her right breast. Aromatase inhibitor for 5 years as above.  3. Sore throat/tongue: Dukes mouthwash prn.   Patient expressed understanding and was in agreement with this plan. She also understands that she can call clinic at any time with any questions, concerns, or complaints.    Lequita Asal, MD   07/28/2015 10:15 AM

## 2015-07-28 NOTE — Progress Notes (Signed)
Patient had a loss of appetite after her last treatment.  She did receive the Nulasta injection and had low back pain that radiate across hips on the 2nd day after the injection that lasted 1 day the pain was relieved with Naproxen.

## 2015-07-29 ENCOUNTER — Inpatient Hospital Stay: Payer: Medicare Other

## 2015-07-29 DIAGNOSIS — C50912 Malignant neoplasm of unspecified site of left female breast: Secondary | ICD-10-CM | POA: Diagnosis not present

## 2015-07-29 MED ORDER — PEGFILGRASTIM INJECTION 6 MG/0.6ML ~~LOC~~
6.0000 mg | PREFILLED_SYRINGE | Freq: Once | SUBCUTANEOUS | Status: AC
  Administered 2015-07-29: 6 mg via SUBCUTANEOUS
  Filled 2015-07-29: qty 0.6

## 2015-08-09 ENCOUNTER — Encounter: Payer: Self-pay | Admitting: Hematology and Oncology

## 2015-08-14 ENCOUNTER — Ambulatory Visit
Admission: RE | Admit: 2015-08-14 | Discharge: 2015-08-14 | Disposition: A | Discharge status: Home / Self Care | Payer: Medicare Other | Source: Ambulatory Visit | Attending: Hematology and Oncology | Admitting: Hematology and Oncology

## 2015-08-14 DIAGNOSIS — C50911 Malignant neoplasm of unspecified site of right female breast: Secondary | ICD-10-CM | POA: Insufficient documentation

## 2015-08-14 DIAGNOSIS — C50912 Malignant neoplasm of unspecified site of left female breast: Secondary | ICD-10-CM | POA: Diagnosis not present

## 2015-08-14 MED ORDER — TECHNETIUM TC 99M-LABELED RED BLOOD CELLS IV KIT
20.0000 | PACK | Freq: Once | INTRAVENOUS | Status: AC | PRN
  Administered 2015-08-14: 21.92 via INTRAVENOUS

## 2015-08-18 ENCOUNTER — Inpatient Hospital Stay: Payer: Medicare Other | Admitting: Hematology and Oncology

## 2015-08-18 ENCOUNTER — Inpatient Hospital Stay (HOSPITAL_BASED_OUTPATIENT_CLINIC_OR_DEPARTMENT_OTHER): Payer: Medicare Other | Admitting: Oncology

## 2015-08-18 ENCOUNTER — Inpatient Hospital Stay: Payer: Medicare Other

## 2015-08-18 ENCOUNTER — Inpatient Hospital Stay: Payer: Medicare Other | Attending: Oncology

## 2015-08-18 VITALS — BP 135/87 | HR 90 | Temp 98.1°F | Resp 16 | Wt 162.7 lb

## 2015-08-18 DIAGNOSIS — Z862 Personal history of diseases of the blood and blood-forming organs and certain disorders involving the immune mechanism: Secondary | ICD-10-CM

## 2015-08-18 DIAGNOSIS — K449 Diaphragmatic hernia without obstruction or gangrene: Secondary | ICD-10-CM | POA: Diagnosis not present

## 2015-08-18 DIAGNOSIS — Z79899 Other long term (current) drug therapy: Secondary | ICD-10-CM | POA: Insufficient documentation

## 2015-08-18 DIAGNOSIS — I10 Essential (primary) hypertension: Secondary | ICD-10-CM | POA: Diagnosis not present

## 2015-08-18 DIAGNOSIS — C50912 Malignant neoplasm of unspecified site of left female breast: Secondary | ICD-10-CM | POA: Insufficient documentation

## 2015-08-18 DIAGNOSIS — M899 Disorder of bone, unspecified: Secondary | ICD-10-CM

## 2015-08-18 DIAGNOSIS — C50911 Malignant neoplasm of unspecified site of right female breast: Secondary | ICD-10-CM

## 2015-08-18 DIAGNOSIS — K219 Gastro-esophageal reflux disease without esophagitis: Secondary | ICD-10-CM | POA: Insufficient documentation

## 2015-08-18 DIAGNOSIS — R918 Other nonspecific abnormal finding of lung field: Secondary | ICD-10-CM | POA: Insufficient documentation

## 2015-08-18 DIAGNOSIS — Z808 Family history of malignant neoplasm of other organs or systems: Secondary | ICD-10-CM | POA: Insufficient documentation

## 2015-08-18 DIAGNOSIS — Z170 Estrogen receptor positive status [ER+]: Secondary | ICD-10-CM | POA: Diagnosis not present

## 2015-08-18 DIAGNOSIS — Z7689 Persons encountering health services in other specified circumstances: Secondary | ICD-10-CM

## 2015-08-18 DIAGNOSIS — Z85828 Personal history of other malignant neoplasm of skin: Secondary | ICD-10-CM | POA: Insufficient documentation

## 2015-08-18 DIAGNOSIS — R197 Diarrhea, unspecified: Secondary | ICD-10-CM

## 2015-08-18 DIAGNOSIS — R531 Weakness: Secondary | ICD-10-CM | POA: Insufficient documentation

## 2015-08-18 DIAGNOSIS — G4730 Sleep apnea, unspecified: Secondary | ICD-10-CM

## 2015-08-18 DIAGNOSIS — R630 Anorexia: Secondary | ICD-10-CM | POA: Diagnosis not present

## 2015-08-18 DIAGNOSIS — Z8041 Family history of malignant neoplasm of ovary: Secondary | ICD-10-CM

## 2015-08-18 DIAGNOSIS — Z803 Family history of malignant neoplasm of breast: Secondary | ICD-10-CM | POA: Diagnosis not present

## 2015-08-18 DIAGNOSIS — Z5111 Encounter for antineoplastic chemotherapy: Secondary | ICD-10-CM | POA: Insufficient documentation

## 2015-08-18 LAB — CBC WITH DIFFERENTIAL/PLATELET
Basophils Absolute: 0 10*3/uL (ref 0–0.1)
Basophils Relative: 1 %
Eosinophils Absolute: 0 10*3/uL (ref 0–0.7)
Eosinophils Relative: 0 %
HCT: 33.3 % — ABNORMAL LOW (ref 35.0–47.0)
Hemoglobin: 11.2 g/dL — ABNORMAL LOW (ref 12.0–16.0)
Lymphocytes Relative: 22 %
Lymphs Abs: 1.1 10*3/uL (ref 1.0–3.6)
MCH: 29.8 pg (ref 26.0–34.0)
MCHC: 33.6 g/dL (ref 32.0–36.0)
MCV: 88.6 fL (ref 80.0–100.0)
Monocytes Absolute: 0.6 10*3/uL (ref 0.2–0.9)
Monocytes Relative: 12 %
Neutro Abs: 3.1 10*3/uL (ref 1.4–6.5)
Neutrophils Relative %: 65 %
Platelets: 265 10*3/uL (ref 150–440)
RBC: 3.76 MIL/uL — ABNORMAL LOW (ref 3.80–5.20)
RDW: 17.3 % — ABNORMAL HIGH (ref 11.5–14.5)
WBC: 4.8 10*3/uL (ref 3.6–11.0)

## 2015-08-18 LAB — COMPREHENSIVE METABOLIC PANEL
ALT: 20 U/L (ref 14–54)
AST: 29 U/L (ref 15–41)
Albumin: 3.6 g/dL (ref 3.5–5.0)
Alkaline Phosphatase: 61 U/L (ref 38–126)
Anion gap: 2 — ABNORMAL LOW (ref 5–15)
BUN: 18 mg/dL (ref 6–20)
CO2: 24 mmol/L (ref 22–32)
Calcium: 9.1 mg/dL (ref 8.9–10.3)
Chloride: 110 mmol/L (ref 101–111)
Creatinine, Ser: 0.73 mg/dL (ref 0.44–1.00)
GFR calc Af Amer: >60 mL/min (ref >60)
GFR calc non Af Amer: >60 mL/min (ref >60)
Glucose, Bld: 119 mg/dL — ABNORMAL HIGH (ref 65–99)
Potassium: 3.9 mmol/L (ref 3.5–5.1)
Sodium: 136 mmol/L (ref 135–145)
Total Bilirubin: 0.9 mg/dL (ref 0.3–1.2)
Total Protein: 6.3 g/dL — ABNORMAL LOW (ref 6.5–8.1)

## 2015-08-18 MED ORDER — SODIUM CHLORIDE 0.9 % IV SOLN
10.0000 mg | Freq: Once | INTRAVENOUS | Status: AC
Start: 2015-08-18 — End: 2015-08-18
  Administered 2015-08-18: 10 mg via INTRAVENOUS
  Filled 2015-08-18: qty 1

## 2015-08-18 MED ORDER — DOCETAXEL CHEMO INJECTION 160 MG/16ML
75.0000 mg/m2 | Freq: Once | INTRAVENOUS | Status: AC
  Administered 2015-08-18: 140 mg via INTRAVENOUS
  Filled 2015-08-18: qty 14

## 2015-08-18 MED ORDER — ACETAMINOPHEN 325 MG PO TABS
650.0000 mg | ORAL_TABLET | Freq: Once | ORAL | Status: AC
  Administered 2015-08-18: 650 mg via ORAL
  Filled 2015-08-18: qty 2

## 2015-08-18 MED ORDER — PALONOSETRON HCL INJECTION 0.25 MG/5ML
0.2500 mg | Freq: Once | INTRAVENOUS | Status: AC
  Administered 2015-08-18: 0.25 mg via INTRAVENOUS
  Filled 2015-08-18: qty 5

## 2015-08-18 MED ORDER — SODIUM CHLORIDE 0.9 % IV SOLN
Freq: Once | INTRAVENOUS | Status: AC
  Administered 2015-08-18: 10:00:00 via INTRAVENOUS
  Filled 2015-08-18: qty 1000

## 2015-08-18 MED ORDER — TRASTUZUMAB CHEMO INJECTION 440 MG
6.0000 mg/kg | Freq: Once | INTRAVENOUS | Status: AC
  Administered 2015-08-18: 462 mg via INTRAVENOUS
  Filled 2015-08-18: qty 22

## 2015-08-18 MED ORDER — SODIUM CHLORIDE 0.9% FLUSH
10.0000 mL | INTRAVENOUS | Status: DC | PRN
  Administered 2015-08-18: 10 mL
  Filled 2015-08-18: qty 10

## 2015-08-18 MED ORDER — SODIUM CHLORIDE 0.9 % IV SOLN
400.5000 mg | Freq: Once | INTRAVENOUS | Status: AC
  Administered 2015-08-18: 400 mg via INTRAVENOUS
  Filled 2015-08-18: qty 40

## 2015-08-18 MED ORDER — HEPARIN SOD (PORK) LOCK FLUSH 100 UNIT/ML IV SOLN
500.0000 [IU] | Freq: Once | INTRAVENOUS | Status: AC | PRN
  Administered 2015-08-18: 500 [IU]
  Filled 2015-08-18: qty 5

## 2015-08-18 MED ORDER — DIPHENHYDRAMINE HCL 25 MG PO CAPS
25.0000 mg | ORAL_CAPSULE | Freq: Once | ORAL | Status: AC
  Administered 2015-08-18: 25 mg via ORAL
  Filled 2015-08-18: qty 1

## 2015-08-18 MED ORDER — PERTUZUMAB CHEMO INJECTION 420 MG/14ML
420.0000 mg | Freq: Once | INTRAVENOUS | Status: AC
  Administered 2015-08-18: 420 mg via INTRAVENOUS
  Filled 2015-08-18: qty 14

## 2015-08-18 NOTE — Progress Notes (Signed)
About a week after treatment patient does not have much energy or an appetite.

## 2015-08-19 ENCOUNTER — Inpatient Hospital Stay: Payer: Medicare Other

## 2015-08-19 DIAGNOSIS — C50912 Malignant neoplasm of unspecified site of left female breast: Secondary | ICD-10-CM

## 2015-08-19 DIAGNOSIS — C50911 Malignant neoplasm of unspecified site of right female breast: Secondary | ICD-10-CM | POA: Diagnosis not present

## 2015-08-19 MED ORDER — PEGFILGRASTIM INJECTION 6 MG/0.6ML ~~LOC~~
6.0000 mg | PREFILLED_SYRINGE | Freq: Once | SUBCUTANEOUS | Status: AC
  Administered 2015-08-19: 6 mg via SUBCUTANEOUS
  Filled 2015-08-19: qty 0.6

## 2015-08-19 NOTE — Progress Notes (Signed)
[place]  Telephone:(336) [phone] Fax:(336) [phone]  ID: Stephanie Crosby OB: 1935-08-10  MR#: [id number]  JME#:[id number]  [name], MD as PCP - General (Family Medicine)  CHIEF COMPLAINT:  Chief Complaint  Patient presents with  . Breast Cancer    INTERVAL HISTORY: Patient returns to clinic today for further evaluation and consideration of cycle 4 of Taxotere, carboplatinum, Herceptin, and Perjeta.  She currently feels well and is asymptomatic. She has decreased appetite and weakness approximately one week after her treatments, but otherwise is tolerating them well.  She has no neurologic complaints. She denies any fevers. She denies any pain. She has a good appetite and denies weight loss. She has no chest pain or shortness of breath. She denies any nausea, vomiting, constipation or diarrhea. She has no urinary complaints. Patient offers no further specific complaints.  REVIEW OF SYSTEMS:   Review of Systems  Constitutional: Negative for fever, weight loss and malaise/fatigue.  HENT: Negative for sore throat.   Respiratory: Negative.   Cardiovascular: Negative.   Gastrointestinal: Negative.  Negative for diarrhea.  Musculoskeletal: Negative.   Skin: Negative.   Neurological: Negative.  Negative for weakness.  Psychiatric/Behavioral: Negative.     As per HPI. Otherwise, a complete review of systems is negatve.  PAST MEDICAL HISTORY: Past Medical History  Diagnosis Date  . Skin cancer 2011  . Back injury   . Hiatal hernia   . Anemia   . GERD (gastroesophageal reflux disease)   . Sleep apnea   . Breast cancer in situ 05/2015    Left  . Seasonal allergic rhinitis   . Gonalgia   . Breast cancer ([place])     PAST SURGICAL HISTORY: Past Surgical History  Procedure Laterality Date  . Appendectomy    . Cataract extraction, bilateral Bilateral 2012  . Colonoscopy    . Portacath placement Right 06/10/2015    Procedure: INSERTION  PORT-A-CATH;  Surgeon: [name], MD;  Location: [facility] ORS;  Service: General;  Laterality: Right;    FAMILY HISTORY: Patient reports a maternal aunt with breast cancer as well as a female cousin with breast cancer.  Family History  Problem Relation Age of Onset  . Breast cancer Neg Hx   . Dementia Mother   . Hyperlipidemia Mother   . Hypertension Mother   . Congestive Heart Failure Mother   . Heart disease Mother   . Aneurysm Father     abdominal  . Fibromyalgia Sister   . Heart disease Sister   . Cancer Maternal Uncle     brain  . Heart disease Maternal Uncle   . Ovarian cancer Maternal Grandmother   . Cancer Cousin     breast cancer       ADVANCED DIRECTIVES:    HEALTH MAINTENANCE: Social History  Substance Use Topics  . Smoking status: Never Smoker   . Smokeless tobacco: Never Used  . Alcohol Use: 0.0 oz/week    0 Standard drinks or equivalent per week     Comment: 3 glasses wine/week     Colonoscopy:  PAP:  Bone density:  Lipid panel:  Allergies  Allergen Reactions  . Penicillins Rash    Current Outpatient Prescriptions  Medication Sig Dispense Refill  . ferrous fumarate (HEMOCYTE - 106 MG FE) 325 (106 Fe) MG TABS tablet Take 1 tablet by mouth. Reported on 06/15/2015    . fluorometholone (FML) 0.1 % ophthalmic suspension 1 drop as needed.    . lidocaine-prilocaine (EMLA)  cream Apply topically once.    . magic mouthwash SOLN Take 5 mLs by mouth 4 (four) times daily as needed for mouth pain. 360 mL 1  . Multiple Vitamins-Minerals (CENTRUM SILVER PO) Take by mouth. Reported on 06/15/2015    . omeprazole (PRILOSEC) 20 MG capsule Take 1 capsule by mouth daily. Reported on 06/15/2015    . ondansetron (ZOFRAN) 8 MG tablet Take 1 tablet by mouth 3 (three) times daily as needed. Reported on 06/15/2015    . valACYclovir (VALTREX) 500 MG tablet Take by mouth as needed. Reported on 06/15/2015     No current facility-administered medications for this visit.    Facility-Administered Medications Ordered in Other Visits  Medication Dose Route Frequency Provider Last Rate Last Dose  . sodium chloride flush (NS) 0.9 % injection 10 mL  10 mL Intracatheter PRN Lloyd Huger, MD        OBJECTIVE: Filed Vitals:   08/18/15 0919  BP: 135/87  Pulse: 90  Temp: 98.1 F (36.7 C)  Resp: 16     Body mass index is 25.48 kg/(m^2).    ECOG FS:0 - Asymptomatic  General: Well-developed, well-nourished, no acute distress. Eyes: Pink conjunctiva, anicteric sclera. Breasts: Exam be deferred today. Lungs: Clear to auscultation bilaterally. Heart: Regular rate and rhythm. No rubs, murmurs, or gallops. Abdomen: Soft, nontender, nondistended. No organomegaly noted, normoactive bowel sounds. Musculoskeletal: No edema, cyanosis, or clubbing. Neuro: Alert, answering all questions appropriately. Cranial nerves grossly intact. Skin: ecchymosis right breast. Psych: Normal affect.   LAB RESULTS:  Lab Results  Component Value Date   NA 136 08/18/2015   K 3.9 08/18/2015   CL 110 08/18/2015   CO2 24 08/18/2015   GLUCOSE 119* 08/18/2015   BUN 18 08/18/2015   CREATININE 0.73 08/18/2015   CALCIUM 9.1 08/18/2015   PROT 6.3* 08/18/2015   ALBUMIN 3.6 08/18/2015   AST 29 08/18/2015   ALT 20 08/18/2015   ALKPHOS 61 08/18/2015   BILITOT 0.9 08/18/2015   GFRNONAA >60 08/18/2015   GFRAA >60 08/18/2015    Lab Results  Component Value Date   WBC 4.8 08/18/2015   NEUTROABS 3.1 08/18/2015   HGB 11.2* 08/18/2015   HCT 33.3* 08/18/2015   MCV 88.6 08/18/2015   PLT 265 08/18/2015     STUDIES: Nm Cardiac Muga Rest  08/14/2015  CLINICAL DATA:  Breast cancer. EXAM: NUCLEAR MEDICINE CARDIAC BLOOD POOL IMAGING (MUGA) TECHNIQUE: Cardiac multi-gated acquisition was performed at rest following intravenous injection of Tc-41mlabeled red blood cells. RADIOPHARMACEUTICALS:  21.9 mCi Tc-932mDP in-vitro labeled red blood cells IV COMPARISON:  06/09/2015 FINDINGS: All on  the current exam the left ventricular ejection fraction is equal to 64.8%. On the previous exam the left ventricular ejection fraction was equal to 66.8%. On the gated SPECT images there is normal left ventricular wall motion. IMPRESSION: 1. Normal left ventricular ejection fraction equal to 64.8%. Electronically Signed   By: TaKerby Moors.D.   On: 08/14/2015 10:42    ASSESSMENT:   1. Clinical stage IIb ER positive, PR negative, HER-2 overexpressing invasive lobular cancer of the left breast. 2. Clinical stage Ia ER/PR positive, HER-2 negative invasive ductal carcinoma of the right breast.  PLAN:    1. Left breast cancer: The vast majority of invasive lobular breast cancers are HER-2 negative. Case discussed with pathology and patient's final pathology is accurate although unusual. MRI results reviewed independently and reported as above with a larger lesion than anticipated based on mammography study.  CT scan of the chest, abdomen, and pelvis revealed a 5 mm pulmonary nodule too small to characterize as well as a lesion in her L4 vertebrae that appears to be benign. Typically patients with invasive lobular cancer that are HER-2 positive are ER positive and PR negative as is the case here. Given the size of her tumor and that she is HER-2 positive, she will benefit from neoadjuvant chemotherapy. Pretreatment MUGA scan revealed an EF of 66.8%. She will also benefit from an aromatase inhibitor at the conclusion of her treatments. Patient will likely require XRT even after mastectomy given the size of her initial tumor. Proceed with cycle 4 of Taxotere, Carboplatinum, Herceptin, Perjetta today. Return to clinic in 2 days for Neulasta. Patient has a high school reunion coming up and has requested a 1 week delay in her next treatment, therefore return to clinic in 4 weeks for consideration of cycle 5. 2. Right breast cancer: Confirmed by MRI guided biopsy. Chemotherapy as above. Patient will likely require  lumpectomy followed by XRT of her right breast. Aromatase inhibitor for 5 years as above. 3. Elevated blood pressure: Improved. Monitor. 4. Diarrhea: Continue Imodium as needed. 5. Neutropenia: Resolved. Patient will receive Neulasta with the remainder of her treatments.  Patient expressed understanding and was in agreement with this plan. She also understands that She can call clinic at any time with any questions, concerns, or complaints.    Lloyd Huger, MD   08/19/2015 9:09 AM

## 2015-09-15 ENCOUNTER — Encounter: Payer: Self-pay | Admitting: *Deleted

## 2015-09-15 ENCOUNTER — Inpatient Hospital Stay: Payer: Medicare Other

## 2015-09-15 ENCOUNTER — Inpatient Hospital Stay: Payer: Medicare Other | Attending: Oncology | Admitting: Oncology

## 2015-09-15 VITALS — BP 117/73 | HR 90 | Temp 96.9°F | Resp 16 | Wt 160.9 lb

## 2015-09-15 DIAGNOSIS — D509 Iron deficiency anemia, unspecified: Secondary | ICD-10-CM | POA: Diagnosis not present

## 2015-09-15 DIAGNOSIS — K921 Melena: Secondary | ICD-10-CM | POA: Insufficient documentation

## 2015-09-15 DIAGNOSIS — K449 Diaphragmatic hernia without obstruction or gangrene: Secondary | ICD-10-CM | POA: Diagnosis not present

## 2015-09-15 DIAGNOSIS — Z170 Estrogen receptor positive status [ER+]: Secondary | ICD-10-CM | POA: Diagnosis not present

## 2015-09-15 DIAGNOSIS — Z85828 Personal history of other malignant neoplasm of skin: Secondary | ICD-10-CM | POA: Diagnosis not present

## 2015-09-15 DIAGNOSIS — C50912 Malignant neoplasm of unspecified site of left female breast: Secondary | ICD-10-CM | POA: Diagnosis not present

## 2015-09-15 DIAGNOSIS — C50911 Malignant neoplasm of unspecified site of right female breast: Secondary | ICD-10-CM | POA: Insufficient documentation

## 2015-09-15 DIAGNOSIS — R531 Weakness: Secondary | ICD-10-CM | POA: Insufficient documentation

## 2015-09-15 DIAGNOSIS — Z5112 Encounter for antineoplastic immunotherapy: Secondary | ICD-10-CM | POA: Insufficient documentation

## 2015-09-15 DIAGNOSIS — G4730 Sleep apnea, unspecified: Secondary | ICD-10-CM | POA: Diagnosis not present

## 2015-09-15 DIAGNOSIS — Z808 Family history of malignant neoplasm of other organs or systems: Secondary | ICD-10-CM | POA: Insufficient documentation

## 2015-09-15 DIAGNOSIS — J302 Other seasonal allergic rhinitis: Secondary | ICD-10-CM | POA: Diagnosis not present

## 2015-09-15 DIAGNOSIS — Z79899 Other long term (current) drug therapy: Secondary | ICD-10-CM | POA: Diagnosis not present

## 2015-09-15 DIAGNOSIS — K219 Gastro-esophageal reflux disease without esophagitis: Secondary | ICD-10-CM | POA: Diagnosis not present

## 2015-09-15 DIAGNOSIS — R5383 Other fatigue: Secondary | ICD-10-CM

## 2015-09-15 DIAGNOSIS — Z5111 Encounter for antineoplastic chemotherapy: Secondary | ICD-10-CM | POA: Diagnosis not present

## 2015-09-15 LAB — IRON AND TIBC
IRON: 10 ug/dL — AB (ref 28–170)
Saturation Ratios: 3 % — ABNORMAL LOW (ref 10.4–31.8)
TIBC: 395 ug/dL (ref 250–450)
UIBC: 385 ug/dL

## 2015-09-15 LAB — CBC WITH DIFFERENTIAL/PLATELET
BASOS ABS: 0 10*3/uL (ref 0–0.1)
Basophils Relative: 1 %
Eosinophils Absolute: 0.1 10*3/uL (ref 0–0.7)
Eosinophils Relative: 3 %
HEMATOCRIT: 27 % — AB (ref 35.0–47.0)
Hemoglobin: 9 g/dL — ABNORMAL LOW (ref 12.0–16.0)
LYMPHS ABS: 1.2 10*3/uL (ref 1.0–3.6)
LYMPHS PCT: 34 %
MCH: 29.3 pg (ref 26.0–34.0)
MCHC: 33.4 g/dL (ref 32.0–36.0)
MCV: 87.6 fL (ref 80.0–100.0)
MONO ABS: 0.4 10*3/uL (ref 0.2–0.9)
MONOS PCT: 11 %
NEUTROS ABS: 1.9 10*3/uL (ref 1.4–6.5)
Neutrophils Relative %: 53 %
Platelets: 245 10*3/uL (ref 150–440)
RBC: 3.08 MIL/uL — ABNORMAL LOW (ref 3.80–5.20)
RDW: 17.1 % — AB (ref 11.5–14.5)
WBC: 3.6 10*3/uL (ref 3.6–11.0)

## 2015-09-15 LAB — COMPREHENSIVE METABOLIC PANEL
ALT: 17 U/L (ref 14–54)
AST: 25 U/L (ref 15–41)
Albumin: 3.4 g/dL — ABNORMAL LOW (ref 3.5–5.0)
Alkaline Phosphatase: 47 U/L (ref 38–126)
Anion gap: 3 — ABNORMAL LOW (ref 5–15)
BILIRUBIN TOTAL: 0.9 mg/dL (ref 0.3–1.2)
BUN: 21 mg/dL — AB (ref 6–20)
CO2: 25 mmol/L (ref 22–32)
CREATININE: 0.65 mg/dL (ref 0.44–1.00)
Calcium: 8.9 mg/dL (ref 8.9–10.3)
Chloride: 109 mmol/L (ref 101–111)
GFR calc Af Amer: >60 mL/min (ref >60)
Glucose, Bld: 149 mg/dL — ABNORMAL HIGH (ref 65–99)
POTASSIUM: 3.6 mmol/L (ref 3.5–5.1)
Sodium: 137 mmol/L (ref 135–145)
TOTAL PROTEIN: 5.8 g/dL — AB (ref 6.5–8.1)

## 2015-09-15 LAB — FERRITIN: Ferritin: 14 ng/mL (ref 11–307)

## 2015-09-15 MED ORDER — SODIUM CHLORIDE 0.9 % IV SOLN
Freq: Once | INTRAVENOUS | Status: AC
  Administered 2015-09-15: 11:00:00 via INTRAVENOUS
  Filled 2015-09-15: qty 1000

## 2015-09-15 MED ORDER — SODIUM CHLORIDE 0.9 % IV SOLN
420.0000 mg | Freq: Once | INTRAVENOUS | Status: AC
  Administered 2015-09-15: 420 mg via INTRAVENOUS
  Filled 2015-09-15: qty 14

## 2015-09-15 MED ORDER — DIPHENHYDRAMINE HCL 25 MG PO CAPS
25.0000 mg | ORAL_CAPSULE | Freq: Once | ORAL | Status: AC
  Administered 2015-09-15: 25 mg via ORAL
  Filled 2015-09-15: qty 1

## 2015-09-15 MED ORDER — DOCETAXEL CHEMO INJECTION 160 MG/16ML
75.0000 mg/m2 | Freq: Once | INTRAVENOUS | Status: AC
  Administered 2015-09-15: 140 mg via INTRAVENOUS
  Filled 2015-09-15: qty 14

## 2015-09-15 MED ORDER — SODIUM CHLORIDE 0.9% FLUSH
10.0000 mL | INTRAVENOUS | Status: DC | PRN
  Administered 2015-09-15: 10 mL
  Filled 2015-09-15: qty 10

## 2015-09-15 MED ORDER — TRASTUZUMAB CHEMO INJECTION 440 MG
6.0000 mg/kg | Freq: Once | INTRAVENOUS | Status: AC
  Administered 2015-09-15: 462 mg via INTRAVENOUS
  Filled 2015-09-15: qty 22

## 2015-09-15 MED ORDER — DEXAMETHASONE SODIUM PHOSPHATE 100 MG/10ML IJ SOLN
10.0000 mg | Freq: Once | INTRAMUSCULAR | Status: AC
  Administered 2015-09-15: 10 mg via INTRAVENOUS
  Filled 2015-09-15: qty 1

## 2015-09-15 MED ORDER — ACETAMINOPHEN 325 MG PO TABS
650.0000 mg | ORAL_TABLET | Freq: Once | ORAL | Status: AC
  Administered 2015-09-15: 650 mg via ORAL
  Filled 2015-09-15: qty 2

## 2015-09-15 MED ORDER — PALONOSETRON HCL INJECTION 0.25 MG/5ML
0.2500 mg | Freq: Once | INTRAVENOUS | Status: AC
  Administered 2015-09-15: 0.25 mg via INTRAVENOUS
  Filled 2015-09-15: qty 5

## 2015-09-15 MED ORDER — SODIUM CHLORIDE 0.9 % IV SOLN
400.5000 mg | Freq: Once | INTRAVENOUS | Status: AC
  Administered 2015-09-15: 400 mg via INTRAVENOUS
  Filled 2015-09-15: qty 40

## 2015-09-15 MED ORDER — HEPARIN SOD (PORK) LOCK FLUSH 100 UNIT/ML IV SOLN
500.0000 [IU] | Freq: Once | INTRAVENOUS | Status: AC | PRN
  Administered 2015-09-15: 500 [IU]
  Filled 2015-09-15: qty 5

## 2015-09-15 NOTE — Progress Notes (Signed)
[facility]  [phone] [phone]  ID: [name] OB: [birth year]  MR#: [id number]  TKP#:[id number]  [name], MD as PCP - General (Family Medicine)  CHIEF COMPLAINT:  Chief Complaint  Patient presents with  . Breast Cancer    INTERVAL HISTORY: Patient returns to clinic today for further evaluation and consideration of cycle 5 of Taxotere, carboplatinum, Herceptin, and Perjeta.  She currently feels well other than feeling very tired. She has decreased appetite and weakness approximately one week after her treatments. She does not eat well for about a week after treatments because she does not have an appetite and 'feels full'. She does drink fluids well though She has had constant clear drainage from her eyes since last treatment and today has a red sore on the corner of her right eye. She does have allergies but not usually this bad. She also noticed that she had black stools for a couple days a week ago. She does have a hiatal hernia which she knows can cause black stools. She also takes omeprazole for acid reflux. She has no neurologic complaints. She denies any fevers. She denies any pain. She has no chest pain or shortness of breath. She denies any nausea, vomiting, constipation or diarrhea. She has no urinary complaints. Patient offers no further specific complaints.  REVIEW OF SYSTEMS:   Review of Systems  Constitutional: Positive for malaise/fatigue. Negative for fever and weight loss.  HENT: Negative for sore throat.   Eyes: Positive for discharge. Negative for blurred vision, double vision, photophobia, pain and redness.  Respiratory: Negative.   Cardiovascular: Negative.   Gastrointestinal: Positive for melena. Negative for diarrhea.  Musculoskeletal: Negative.   Skin: Negative.   Neurological: Positive for weakness.  Psychiatric/Behavioral: Negative.     As per HPI. Otherwise, a complete review of systems  is negatve.  PAST MEDICAL HISTORY: Past Medical History  Diagnosis Date  . Skin cancer 2011  . Back injury   . Hiatal hernia   . Anemia   . GERD (gastroesophageal reflux disease)   . Sleep apnea   . Breast cancer in situ 05/2015    Left  . Seasonal allergic rhinitis   . Gonalgia   . Breast cancer ([place])     PAST SURGICAL HISTORY: Past Surgical History  Procedure Laterality Date  . Appendectomy    . Cataract extraction, bilateral Bilateral 2012  . Colonoscopy    . Portacath placement Right 06/10/2015    Procedure: INSERTION PORT-A-CATH;  Surgeon: [name], MD;  Location: [facility] ORS;  Service: General;  Laterality: Right;    FAMILY HISTORY: Patient reports a maternal aunt with breast cancer as well as a female cousin with breast cancer.  Family History  Problem Relation Age of Onset  . Breast cancer Neg Hx   . Dementia Mother   . Hyperlipidemia Mother   . Hypertension Mother   . Congestive Heart Failure Mother   . Heart disease Mother   . Aneurysm Father     abdominal  . Fibromyalgia Sister   . Heart disease Sister   . Cancer Maternal Uncle     brain  . Heart disease Maternal Uncle   . Ovarian cancer Maternal Grandmother   . Cancer Cousin     breast cancer       ADVANCED DIRECTIVES:    HEALTH MAINTENANCE: Social History  Substance Use Topics  . Smoking status: Never Smoker   . Smokeless tobacco: Never Used  .  Alcohol Use: 0.0 oz/week    0 Standard drinks or equivalent per week     Comment: 3 glasses wine/week      Allergies  Allergen Reactions  . Penicillins Rash    Current Outpatient Prescriptions  Medication Sig Dispense Refill  . fluorometholone (FML) 0.1 % ophthalmic suspension 1 drop as needed.    . lidocaine-prilocaine (EMLA) cream Apply topically once.    . magic mouthwash SOLN Take 5 mLs by mouth 4 (four) times daily as needed for mouth pain. 360 mL 1  . Multiple Vitamins-Minerals (CENTRUM SILVER PO) Take by mouth. Reported on 06/15/2015     . omeprazole (PRILOSEC) 20 MG capsule Take 1 capsule by mouth daily. Reported on 06/15/2015    . ondansetron (ZOFRAN) 8 MG tablet Take 1 tablet by mouth 3 (three) times daily as needed. Reported on 06/15/2015    . valACYclovir (VALTREX) 500 MG tablet Take by mouth as needed. Reported on 06/15/2015    . ferrous fumarate (HEMOCYTE - 106 MG FE) 325 (106 Fe) MG TABS tablet Take 1 tablet by mouth. Reported on 09/15/2015     No current facility-administered medications for this visit.   Facility-Administered Medications Ordered in Other Visits  Medication Dose Route Frequency Provider Last Rate Last Dose  . sodium chloride flush (NS) 0.9 % injection 10 mL  10 mL Intracatheter PRN Lloyd Huger, MD        OBJECTIVE: Filed Vitals:   09/15/15 0927  BP: 117/73  Pulse: 90  Temp: 96.9 F (36.1 C)  Resp: 16     Body mass index is 25.2 kg/(m^2).    ECOG FS:0 - Asymptomatic  General: Well-developed, well-nourished, no acute distress. Eyes: Pink conjunctiva, anicteric sclera. Breasts: Exam be deferred today. Lungs: Clear to auscultation bilaterally. Heart: Regular rate and rhythm. No rubs, murmurs, or gallops. Abdomen: Soft, nontender, nondistended. No organomegaly noted, normoactive bowel sounds. Musculoskeletal: No edema, cyanosis, or clubbing. Neuro: Alert, answering all questions appropriately. Cranial nerves grossly intact. Skin: ecchymosis right breast. Psych: Normal affect.   LAB RESULTS:  Lab Results  Component Value Date   NA 137 09/15/2015   K 3.6 09/15/2015   CL 109 09/15/2015   CO2 25 09/15/2015   GLUCOSE 149* 09/15/2015   BUN 21* 09/15/2015   CREATININE 0.65 09/15/2015   CALCIUM 8.9 09/15/2015   PROT 5.8* 09/15/2015   ALBUMIN 3.4* 09/15/2015   AST 25 09/15/2015   ALT 17 09/15/2015   ALKPHOS 47 09/15/2015   BILITOT 0.9 09/15/2015   GFRNONAA >60 09/15/2015   GFRAA >60 09/15/2015    Lab Results  Component Value Date   WBC 3.6 09/15/2015   NEUTROABS 1.9  09/15/2015   HGB 9.0* 09/15/2015   HCT 27.0* 09/15/2015   MCV 87.6 09/15/2015   PLT 245 09/15/2015   Lab Results  Component Value Date   IRON 10* 09/15/2015   TIBC 395 09/15/2015   IRONPCTSAT 3* 09/15/2015   Lab Results  Component Value Date   FERRITIN 14 09/15/2015     STUDIES: No results found.  ASSESSMENT:   1. Clinical stage IIb ER positive, PR negative, HER-2 overexpressing invasive lobular cancer of the left breast. 2. Clinical stage Ia ER/PR positive, HER-2 negative invasive ductal carcinoma of the right breast.  PLAN:    1. Left breast cancer: The vast majority of invasive lobular breast cancers are HER-2 negative. Case discussed with pathology and patient's final pathology is accurate although unusual. MRI results reviewed independently and reported as above  with a larger lesion than anticipated based on mammography study. CT scan of the chest, abdomen, and pelvis revealed a 5 mm pulmonary nodule too small to characterize as well as a lesion in her L4 vertebrae that appears to be benign. Typically patients with invasive lobular cancer that are HER-2 positive are ER positive and PR negative as is the case here. Given the size of her tumor and that she is HER-2 positive, she will benefit from neoadjuvant chemotherapy. Pretreatment MUGA scan in April 2017 revealed an EF of 66.8%. Will repeat in July 2017.  She will also benefit from an aromatase inhibitor at the conclusion of her treatments. Patient will likely require XRT even after mastectomy given the size of her initial tumor. Proceed with cycle 5 of Taxotere, Carboplatinum, Herceptin, Perjetta today. Return to clinic in 2 days for Neulasta.  Return to clinic in 3 weeks for consideration of cycle 6. She will have 18 cycles of Q 3 week Herceptin in the future. Will refer back to surgeon today since her last treatment is June 6th and she will need to have appointment with surgeon after that treatment. Patient will also have BRCA  testing today. 2. Right breast cancer: Confirmed by MRI guided biopsy. Chemotherapy as above. Patient will likely require lumpectomy followed by XRT of her right breast. Aromatase inhibitor for 5 years as above. 3. Anemia: HGB 9.0 today. Iron studies drawn from today revealed significant iron deficiency and will give IV Feraheme at next clinic visit. 4. Eye drainage: Allergies. Patient takes OTC Claritin but suggested change to Zyrtec or Allegra. 5. Acid Reflux: Continue omeprazole. 6. Melena: Probably caused by hiatal hernia. Monitor. Iron studies as above. 7. Decreased appetite: Small frequent meals. Continue adequate fluid intake.   Patient expressed understanding and was in agreement with this plan. She also understands that She can call clinic at any time with any questions, concerns, or complaints.    Mayra Reel, NP   09/15/2015 10:28 AM  Patient was seen and evaluated independently and I agree with the assessment and plan as dictated above.  Lloyd Huger, MD 09/18/2015 1:07 PM

## 2015-09-15 NOTE — Progress Notes (Signed)
Follow up with Dr. Adonis Huguenin scheduled for Monday June 12th @ 9:45am in the [place] office. Patient notified by infusion RN of appt time.

## 2015-09-15 NOTE — Progress Notes (Signed)
Patient has a constant clear drainage from her eyes.  This morniing she notices a red area on corner of right eye.

## 2015-09-17 ENCOUNTER — Telehealth: Payer: Self-pay | Admitting: *Deleted

## 2015-09-17 ENCOUNTER — Telehealth: Payer: Self-pay

## 2015-09-17 ENCOUNTER — Inpatient Hospital Stay: Payer: Medicare Other

## 2015-09-17 ENCOUNTER — Ambulatory Visit: Payer: Medicare Other

## 2015-09-17 NOTE — Telephone Encounter (Signed)
Patients husband called to report patient was evaluated at home by EMS and has returned to her baseline. He reports patients symptoms included nausea, vomiting, shortness of breath, weakness and reports patient passed out. Patients husband asking if she needs to come in this afternoon to see MD or get Neulasta injection. Per Dr. Grayland Ormond patient to go to the ER if symptoms return, may reschedule Neulasta for tomorrow. Patients husband notified of plan and verbalized understanding.

## 2015-09-17 NOTE — Telephone Encounter (Signed)
-----   Message from Stephanie Reel, NP sent at 09/15/2015  4:05 PM EDT ----- Will you please call this patient and let her know that her iron is low and she will have iron infusion with her next chemotherapy? Thanks

## 2015-09-17 NOTE — Telephone Encounter (Signed)
Called to inform patient of lab results but her husband answered to phone and said he was getting ready to call 911.  Stephanie Crosby is very pale, having SOBr, and had an episode of passing out for abut a minute.  I advised that calling 911 is the best to do at this time then we hung up and he is going to call 911 immediately.

## 2015-09-18 ENCOUNTER — Inpatient Hospital Stay: Payer: Medicare Other

## 2015-09-18 VITALS — BP 115/74 | HR 98 | Resp 18

## 2015-09-18 DIAGNOSIS — C50912 Malignant neoplasm of unspecified site of left female breast: Secondary | ICD-10-CM

## 2015-09-18 DIAGNOSIS — C50911 Malignant neoplasm of unspecified site of right female breast: Secondary | ICD-10-CM | POA: Diagnosis not present

## 2015-09-18 MED ORDER — PEGFILGRASTIM INJECTION 6 MG/0.6ML ~~LOC~~
6.0000 mg | PREFILLED_SYRINGE | Freq: Once | SUBCUTANEOUS | Status: AC
  Administered 2015-09-18: 6 mg via SUBCUTANEOUS
  Filled 2015-09-18: qty 0.6

## 2015-10-06 ENCOUNTER — Inpatient Hospital Stay: Payer: Medicare Other | Attending: Oncology

## 2015-10-06 ENCOUNTER — Inpatient Hospital Stay: Payer: Medicare Other

## 2015-10-06 ENCOUNTER — Inpatient Hospital Stay (HOSPITAL_BASED_OUTPATIENT_CLINIC_OR_DEPARTMENT_OTHER): Payer: Medicare Other | Admitting: Oncology

## 2015-10-06 VITALS — BP 128/84 | HR 96 | Temp 97.4°F | Resp 16 | Wt 161.2 lb

## 2015-10-06 VITALS — BP 124/80 | HR 82 | Resp 20

## 2015-10-06 DIAGNOSIS — Z79899 Other long term (current) drug therapy: Secondary | ICD-10-CM | POA: Insufficient documentation

## 2015-10-06 DIAGNOSIS — Z5111 Encounter for antineoplastic chemotherapy: Secondary | ICD-10-CM | POA: Diagnosis not present

## 2015-10-06 DIAGNOSIS — R5383 Other fatigue: Secondary | ICD-10-CM | POA: Diagnosis not present

## 2015-10-06 DIAGNOSIS — R911 Solitary pulmonary nodule: Secondary | ICD-10-CM | POA: Insufficient documentation

## 2015-10-06 DIAGNOSIS — Z85828 Personal history of other malignant neoplasm of skin: Secondary | ICD-10-CM

## 2015-10-06 DIAGNOSIS — Z8041 Family history of malignant neoplasm of ovary: Secondary | ICD-10-CM | POA: Insufficient documentation

## 2015-10-06 DIAGNOSIS — C50911 Malignant neoplasm of unspecified site of right female breast: Secondary | ICD-10-CM

## 2015-10-06 DIAGNOSIS — C50912 Malignant neoplasm of unspecified site of left female breast: Secondary | ICD-10-CM

## 2015-10-06 DIAGNOSIS — K219 Gastro-esophageal reflux disease without esophagitis: Secondary | ICD-10-CM

## 2015-10-06 DIAGNOSIS — D649 Anemia, unspecified: Secondary | ICD-10-CM

## 2015-10-06 DIAGNOSIS — K449 Diaphragmatic hernia without obstruction or gangrene: Secondary | ICD-10-CM | POA: Diagnosis not present

## 2015-10-06 DIAGNOSIS — H579 Unspecified disorder of eye and adnexa: Secondary | ICD-10-CM | POA: Diagnosis not present

## 2015-10-06 DIAGNOSIS — G4730 Sleep apnea, unspecified: Secondary | ICD-10-CM | POA: Insufficient documentation

## 2015-10-06 DIAGNOSIS — M899 Disorder of bone, unspecified: Secondary | ICD-10-CM | POA: Insufficient documentation

## 2015-10-06 DIAGNOSIS — R531 Weakness: Secondary | ICD-10-CM | POA: Insufficient documentation

## 2015-10-06 DIAGNOSIS — Z803 Family history of malignant neoplasm of breast: Secondary | ICD-10-CM | POA: Insufficient documentation

## 2015-10-06 DIAGNOSIS — Z170 Estrogen receptor positive status [ER+]: Secondary | ICD-10-CM | POA: Diagnosis not present

## 2015-10-06 DIAGNOSIS — D509 Iron deficiency anemia, unspecified: Secondary | ICD-10-CM

## 2015-10-06 LAB — COMPREHENSIVE METABOLIC PANEL
ALT: 14 U/L (ref 14–54)
ANION GAP: 5 (ref 5–15)
AST: 21 U/L (ref 15–41)
Albumin: 3.2 g/dL — ABNORMAL LOW (ref 3.5–5.0)
Alkaline Phosphatase: 62 U/L (ref 38–126)
BUN: 16 mg/dL (ref 6–20)
CALCIUM: 9.1 mg/dL (ref 8.9–10.3)
CHLORIDE: 108 mmol/L (ref 101–111)
CO2: 26 mmol/L (ref 22–32)
CREATININE: 0.65 mg/dL (ref 0.44–1.00)
Glucose, Bld: 133 mg/dL — ABNORMAL HIGH (ref 65–99)
Potassium: 3.7 mmol/L (ref 3.5–5.1)
SODIUM: 139 mmol/L (ref 135–145)
Total Bilirubin: 0.6 mg/dL (ref 0.3–1.2)
Total Protein: 6.1 g/dL — ABNORMAL LOW (ref 6.5–8.1)

## 2015-10-06 LAB — CBC WITH DIFFERENTIAL/PLATELET
Basophils Absolute: 0 10*3/uL (ref 0–0.1)
Basophils Relative: 1 %
EOS ABS: 0 10*3/uL (ref 0–0.7)
EOS PCT: 0 %
HCT: 29.9 % — ABNORMAL LOW (ref 35.0–47.0)
Hemoglobin: 9.8 g/dL — ABNORMAL LOW (ref 12.0–16.0)
LYMPHS ABS: 1.2 10*3/uL (ref 1.0–3.6)
LYMPHS PCT: 27 %
MCH: 28.4 pg (ref 26.0–34.0)
MCHC: 32.7 g/dL (ref 32.0–36.0)
MCV: 87.1 fL (ref 80.0–100.0)
MONO ABS: 0.5 10*3/uL (ref 0.2–0.9)
MONOS PCT: 11 %
Neutro Abs: 2.7 10*3/uL (ref 1.4–6.5)
Neutrophils Relative %: 61 %
PLATELETS: 284 10*3/uL (ref 150–440)
RBC: 3.43 MIL/uL — ABNORMAL LOW (ref 3.80–5.20)
RDW: 18.6 % — ABNORMAL HIGH (ref 11.5–14.5)
WBC: 4.5 10*3/uL (ref 3.6–11.0)

## 2015-10-06 MED ORDER — SODIUM CHLORIDE 0.9 % IV SOLN
400.5000 mg | Freq: Once | INTRAVENOUS | Status: AC
  Administered 2015-10-06: 400 mg via INTRAVENOUS
  Filled 2015-10-06: qty 40

## 2015-10-06 MED ORDER — DOCETAXEL CHEMO INJECTION 160 MG/16ML
75.0000 mg/m2 | Freq: Once | INTRAVENOUS | Status: AC
  Administered 2015-10-06: 140 mg via INTRAVENOUS
  Filled 2015-10-06: qty 14

## 2015-10-06 MED ORDER — SODIUM CHLORIDE 0.9 % IV SOLN
10.0000 mg | Freq: Once | INTRAVENOUS | Status: AC
  Administered 2015-10-06: 10 mg via INTRAVENOUS
  Filled 2015-10-06: qty 1

## 2015-10-06 MED ORDER — SODIUM CHLORIDE 0.9 % IV SOLN
510.0000 mg | Freq: Once | INTRAVENOUS | Status: AC
  Administered 2015-10-06: 510 mg via INTRAVENOUS
  Filled 2015-10-06: qty 17

## 2015-10-06 MED ORDER — TRASTUZUMAB CHEMO INJECTION 440 MG
6.0000 mg/kg | Freq: Once | INTRAVENOUS | Status: AC
  Administered 2015-10-06: 462 mg via INTRAVENOUS
  Filled 2015-10-06: qty 22

## 2015-10-06 MED ORDER — SODIUM CHLORIDE 0.9 % IV SOLN
Freq: Once | INTRAVENOUS | Status: AC
  Administered 2015-10-06: 11:00:00 via INTRAVENOUS
  Filled 2015-10-06: qty 1000

## 2015-10-06 MED ORDER — PALONOSETRON HCL INJECTION 0.25 MG/5ML
0.2500 mg | Freq: Once | INTRAVENOUS | Status: AC
  Administered 2015-10-06: 0.25 mg via INTRAVENOUS
  Filled 2015-10-06: qty 5

## 2015-10-06 MED ORDER — SODIUM CHLORIDE 0.9 % IV SOLN
420.0000 mg | Freq: Once | INTRAVENOUS | Status: AC
  Administered 2015-10-06: 420 mg via INTRAVENOUS
  Filled 2015-10-06: qty 14

## 2015-10-06 MED ORDER — ACETAMINOPHEN 325 MG PO TABS
650.0000 mg | ORAL_TABLET | Freq: Once | ORAL | Status: AC
  Administered 2015-10-06: 650 mg via ORAL
  Filled 2015-10-06: qty 2

## 2015-10-06 MED ORDER — DIPHENHYDRAMINE HCL 25 MG PO CAPS
25.0000 mg | ORAL_CAPSULE | Freq: Once | ORAL | Status: AC
  Administered 2015-10-06: 25 mg via ORAL
  Filled 2015-10-06: qty 1

## 2015-10-06 MED ORDER — HEPARIN SOD (PORK) LOCK FLUSH 100 UNIT/ML IV SOLN
500.0000 [IU] | Freq: Once | INTRAVENOUS | Status: AC | PRN
  Administered 2015-10-06: 500 [IU]
  Filled 2015-10-06: qty 5

## 2015-10-06 NOTE — Progress Notes (Signed)
  Oncology Nurse Navigator Documentation  Navigator Location: [facility] (10/06/15 1700)             Patient Visit Type: Follow-up (10/06/15 1700) Treatment Phase: Active Tx (10/06/15 1700) Barriers/Navigation Needs: No barriers at this time (10/06/15 1700)            Support Groups/Services: Breast Support Group (10/06/15 1700)   Acuity: Level 1 (10/06/15 1700) Acuity Level 1: Minimal follow up required (10/06/15 1700)       Time Spent with Patient: 15 (10/06/15 1700)   Met patient and husband in lab area prior to infusion.  She is scheduled to receive final chemotherapy today. Patient states she is doing well, and has appointment with Dr. Adonis Huguenin on 10/12/15 to discuss surgery.  She understands she will continue Herceptin to complete the year. Concerns about follow-up imaging to determine response to treatment.  She will discuss with Dr. Grayland Ormond, and Dr. Adonis Huguenin.

## 2015-10-06 NOTE — Progress Notes (Signed)
Patient had an episode of syncope after last treatment and EMS came out to check vitals which were normal so she decided to not go to hospital.  Does have slight neuropathy in fingers and toes.  Still having significant eye drainage that does not stop.

## 2015-10-08 ENCOUNTER — Inpatient Hospital Stay: Payer: Medicare Other

## 2015-10-08 DIAGNOSIS — C50912 Malignant neoplasm of unspecified site of left female breast: Secondary | ICD-10-CM

## 2015-10-08 MED ORDER — PEGFILGRASTIM INJECTION 6 MG/0.6ML ~~LOC~~
6.0000 mg | PREFILLED_SYRINGE | Freq: Once | SUBCUTANEOUS | Status: AC
  Administered 2015-10-08: 6 mg via SUBCUTANEOUS
  Filled 2015-10-08: qty 0.6

## 2015-10-11 NOTE — Progress Notes (Signed)
[facility]  Telephone:(336) [phone] Fax:(336) [phone]  ID: [name] OB: 04-29-1936  MR#: [id number]  AOZ#:[id number]  [name], MD as PCP - General (Family Medicine) [name], MD as Consulting Physician (General Surgery)  CHIEF COMPLAINT:  Chief Complaint  Patient presents with  . Breast Cancer    INTERVAL HISTORY: Patient returns to clinic today for further evaluation and consideration of cycle 6 of Taxotere, carboplatinum, Herceptin, and Perjeta.  She had a syncopal episode several weeks ago, did not require admission to the hospital. She continues to have thickened watery eye drainage is not helped by Claritin or Benadryl. She otherwise feels well.  She has no neurologic complaints. She denies any fevers. She denies any pain. She has no chest pain or shortness of breath. She denies any nausea, vomiting, constipation or diarrhea. She has no urinary complaints. Patient offers no further specific complaints.  REVIEW OF SYSTEMS:   Review of Systems  Constitutional: Positive for malaise/fatigue. Negative for fever and weight loss.  HENT: Negative for sore throat.   Eyes: Positive for discharge. Negative for blurred vision, double vision, photophobia, pain and redness.  Respiratory: Negative.   Cardiovascular: Negative.   Gastrointestinal: Negative for diarrhea, blood in stool and melena.  Musculoskeletal: Negative.   Skin: Negative.   Neurological: Positive for weakness.  Psychiatric/Behavioral: Negative.     As per HPI. Otherwise, a complete review of systems is negatve.  PAST MEDICAL HISTORY: Past Medical History  Diagnosis Date  . Skin cancer 2011  . Back injury   . Hiatal hernia   . Anemia   . GERD (gastroesophageal reflux disease)   . Sleep apnea   . Breast cancer in situ 05/2015    Left  . Seasonal allergic rhinitis   . Gonalgia   . Breast cancer ([place])     PAST SURGICAL HISTORY: Past Surgical History    Procedure Laterality Date  . Appendectomy    . Cataract extraction, bilateral Bilateral 2012  . Colonoscopy    . Portacath placement Right 06/10/2015    Procedure: INSERTION PORT-A-CATH;  Surgeon: [name], MD;  Location: [facility] ORS;  Service: General;  Laterality: Right;    FAMILY HISTORY: Patient reports a maternal aunt with breast cancer as well as a female cousin with breast cancer.  Family History  Problem Relation Age of Onset  . Breast cancer Neg Hx   . Dementia Mother   . Hyperlipidemia Mother   . Hypertension Mother   . Congestive Heart Failure Mother   . Heart disease Mother   . Aneurysm Father     abdominal  . Fibromyalgia Sister   . Heart disease Sister   . Cancer Maternal Uncle     brain  . Heart disease Maternal Uncle   . Ovarian cancer Maternal Grandmother   . Cancer Cousin     breast cancer       ADVANCED DIRECTIVES:    HEALTH MAINTENANCE: Social History  Substance Use Topics  . Smoking status: Never Smoker   . Smokeless tobacco: Never Used  . Alcohol Use: 0.0 oz/week    0 Standard drinks or equivalent per week     Comment: 3 glasses wine/week      Allergies  Allergen Reactions  . Penicillins Rash    Current Outpatient Prescriptions  Medication Sig Dispense Refill  . ferrous fumarate (HEMOCYTE - 106 MG FE) 325 (106 Fe) MG TABS tablet Take 1 tablet by mouth. Reported on 09/15/2015    .  fluorometholone (FML) 0.1 % ophthalmic suspension 1 drop as needed.    . lidocaine-prilocaine (EMLA) cream Apply topically once.    . magic mouthwash SOLN Take 5 mLs by mouth 4 (four) times daily as needed for mouth pain. 360 mL 1  . Multiple Vitamins-Minerals (CENTRUM SILVER PO) Take by mouth. Reported on 06/15/2015    . omeprazole (PRILOSEC) 20 MG capsule Take 1 capsule by mouth daily. Reported on 06/15/2015    . ondansetron (ZOFRAN) 8 MG tablet Take 1 tablet by mouth 3 (three) times daily as needed. Reported on 06/15/2015    . valACYclovir (VALTREX) 500 MG  tablet Take by mouth as needed. Reported on 06/15/2015     No current facility-administered medications for this visit.   Facility-Administered Medications Ordered in Other Visits  Medication Dose Route Frequency Provider Last Rate Last Dose  . sodium chloride flush (NS) 0.9 % injection 10 mL  10 mL Intracatheter PRN Lloyd Huger, MD        OBJECTIVE: Filed Vitals:   10/06/15 0931  BP: 128/84  Pulse: 96  Temp: 97.4 F (36.3 C)  Resp: 16     Body mass index is 25.23 kg/(m^2).    ECOG FS:0 - Asymptomatic  General: Well-developed, well-nourished, no acute distress. Eyes: Pink conjunctiva, anicteric sclera. Breasts: Exam be deferred today. Lungs: Clear to auscultation bilaterally. Heart: Regular rate and rhythm. No rubs, murmurs, or gallops. Abdomen: Soft, nontender, nondistended. No organomegaly noted, normoactive bowel sounds. Musculoskeletal: No edema, cyanosis, or clubbing. Neuro: Alert, answering all questions appropriately. Cranial nerves grossly intact. Skin: ecchymosis right breast. Psych: Normal affect.   LAB RESULTS:  Lab Results  Component Value Date   NA 139 10/06/2015   K 3.7 10/06/2015   CL 108 10/06/2015   CO2 26 10/06/2015   GLUCOSE 133* 10/06/2015   BUN 16 10/06/2015   CREATININE 0.65 10/06/2015   CALCIUM 9.1 10/06/2015   PROT 6.1* 10/06/2015   ALBUMIN 3.2* 10/06/2015   AST 21 10/06/2015   ALT 14 10/06/2015   ALKPHOS 62 10/06/2015   BILITOT 0.6 10/06/2015   GFRNONAA >60 10/06/2015   GFRAA >60 10/06/2015    Lab Results  Component Value Date   WBC 4.5 10/06/2015   NEUTROABS 2.7 10/06/2015   HGB 9.8* 10/06/2015   HCT 29.9* 10/06/2015   MCV 87.1 10/06/2015   PLT 284 10/06/2015   Lab Results  Component Value Date   IRON 10* 09/15/2015   TIBC 395 09/15/2015   IRONPCTSAT 3* 09/15/2015   Lab Results  Component Value Date   FERRITIN 14 09/15/2015     STUDIES: No results found.  ASSESSMENT:   1. Clinical stage IIb ER positive, PR  negative, HER-2 overexpressing invasive lobular cancer of the left breast. 2. Clinical stage Ia ER/PR positive, HER-2 negative invasive ductal carcinoma of the right breast. 3. BRCA1 and 2 negative.  PLAN:    1. Left breast cancer: The vast majority of invasive lobular breast cancers are HER-2 negative. Case discussed with pathology and patient's final pathology is accurate although unusual. MRI results reviewed independently with a larger lesion than anticipated based on mammography study. CT scan of the chest, abdomen, and pelvis revealed a 5 mm pulmonary nodule too small to characterize as well as a lesion in her L4 vertebrae that appears to be benign. Typically patients with invasive lobular cancer that are HER-2 positive are ER positive and PR negative as is the case here. Given the size of her tumor and that she  is HER-2 positive, she will benefit from neoadjuvant chemotherapy. Pretreatment MUGA scan in April 2017 revealed an EF of 66.8%. Will repeat in July 2017.  She will also benefit from an aromatase inhibitor at the conclusion of her treatments. Patient will likely require XRT even after mastectomy given the size of her initial tumor. Proceed with cycle 6 of Taxotere, Carboplatinum, Herceptin, Perjetta today. Return to clinic in 2 days for Neulasta.  Patient has now completed her chemotherapy, and will return to clinic in 3 weeks for cycle 7 which will be Herceptin only. She will have 18 cycles of Herceptin total. A referral has been made back to surgery. 2. Right breast cancer: Confirmed by MRI guided biopsy. Chemotherapy as above. Patient will likely require lumpectomy followed by XRT of her right breast. Aromatase inhibitor for 5 years as above. 3. Anemia: Hemoglobin slightly improved. Iron stores as above. Patient will receive 510 mg IV Feraheme today and then in 3 weeks with her next infusion of Herceptin.  4. Eye drainage: Possible allergies. Continue OTC medications as directed.  5. Acid  Reflux: Continue omeprazole.   Patient expressed understanding and was in agreement with this plan. She also understands that She can call clinic at any time with any questions, concerns, or complaints.    Lloyd Huger, MD   10/11/2015 6:46 AM

## 2015-10-12 ENCOUNTER — Ambulatory Visit: Payer: Medicare Other | Admitting: General Surgery

## 2015-10-12 ENCOUNTER — Ambulatory Visit (INDEPENDENT_AMBULATORY_CARE_PROVIDER_SITE_OTHER): Payer: Medicare Other | Admitting: General Surgery

## 2015-10-12 ENCOUNTER — Encounter: Payer: Self-pay | Admitting: General Surgery

## 2015-10-12 VITALS — BP 130/74 | HR 101 | Temp 97.5°F | Ht 67.0 in | Wt 160.0 lb

## 2015-10-12 DIAGNOSIS — C50911 Malignant neoplasm of unspecified site of right female breast: Secondary | ICD-10-CM

## 2015-10-12 DIAGNOSIS — C50912 Malignant neoplasm of unspecified site of left female breast: Secondary | ICD-10-CM | POA: Diagnosis not present

## 2015-10-12 NOTE — Patient Instructions (Signed)
You have been scheduled for an MRI of the Bilateral Breasts at [facility] on 10/13/15 at 9:40. Please pick up a disc prior to leaving the facility so that this may be reviewed by our surgeons at your next appointment.  See your appointment information below.

## 2015-10-12 NOTE — Progress Notes (Signed)
Outpatient Surgical Follow Up  10/12/2015  Stephanie Crosby is an 80 y.o. female.   Chief Complaint  Patient presents with  . Follow-up    Bilateral Breast Cancer    HPI: 80 year old female who is well known to the surgery service returns to clinic today to discuss her surgical options. Patient is known to have bilateral breast cancer with a right-sided invasive ductal stage I and a left-sided invasive lobular stage II. Patient has completed 6 cycles of neoadjuvant chemotherapy and continues on Herceptin. She denies being able to palpate any masses on either side and has not had any images in the last 4 months. Patient states that though she completed her chemotherapy last week she has continued to be weak and fatigued since completing chemotherapy last week. She denies any fevers, chills, chest pain, shortness of breath, diarrhea, constipation.  Past Medical History  Diagnosis Date  . Skin cancer 2011  . Back injury   . Hiatal hernia   . Anemia   . GERD (gastroesophageal reflux disease)   . Sleep apnea   . Breast cancer in situ 05/2015    Left  . Seasonal allergic rhinitis   . Gonalgia   . Breast cancer [facility])     Past Surgical History  Procedure Laterality Date  . Appendectomy    . Cataract extraction, bilateral Bilateral 2012  . Colonoscopy    . Portacath placement Right 06/10/2015    Procedure: INSERTION PORT-A-CATH;  Surgeon: [name], MD;  Location: [facility] ORS;  Service: General;  Laterality: Right;    Family History  Problem Relation Age of Onset  . Breast cancer Neg Hx   . Dementia Mother   . Hyperlipidemia Mother   . Hypertension Mother   . Congestive Heart Failure Mother   . Heart disease Mother   . Aneurysm Father     abdominal  . Fibromyalgia Sister   . Heart disease Sister   . Cancer Maternal Uncle     brain  . Heart disease Maternal Uncle   . Ovarian cancer Maternal Grandmother   . Cancer Cousin     breast cancer    Social History:  reports that  she has never smoked. She has never used smokeless tobacco. She reports that she drinks alcohol. She reports that she does not use illicit drugs.  Allergies:  Allergies  Allergen Reactions  . Penicillins Rash    Medications reviewed.    ROS A multipoint review of systems was completed. All pertinent positives and negatives are documented within the history of present illness and remainder are negative.   BP 130/74 mmHg  Pulse 101  Temp(Src) 97.5 F (36.4 C) (Oral)  Ht 5\' 7"  (1.702 m)  Wt 72.576 kg (160 lb)  BMI 25.05 kg/m2  Physical Exam Gen.: No acute distress Neck: Supple and nontender Breast: Bilateral breast examined. No obvious palpable masses on either side. There is no evidence of palpable lymphadenopathy to either axilla. Chest: Clear to auscultation Heart: Tachycardic Abdomen: Soft and nontender    No results found for this or any previous visit (from the past 48 hour(s)). No results found.  Assessment/Plan:  1. Bilateral malignant neoplasm of breast in female, unspecified site of breast [facility]) 80 year old female with bilateral breast cancers completed 6 rounds of neoadjuvant therapy. Discussed the importance of restaging her tumors with repeat MRIs. It is our hope that the neoadjuvant chemotherapy reduce the size of her tumor burden to make her a better candidate for breast conserving surgical options.  Patient voiced understanding of this. Also discussed the complete ligneous surgical options at this time. She'll think about them and we will discuss them further after her repeat images are performed. Patient will return to clinic after completing her MRI of her breast. - MR Breast Bilateral W Wo Contrast; Future     [name], MD [facility] General Surgeon  10/12/2015,3:13 PM

## 2015-10-13 ENCOUNTER — Telehealth: Payer: Self-pay | Admitting: *Deleted

## 2015-10-13 ENCOUNTER — Other Ambulatory Visit: Payer: Medicare Other

## 2015-10-13 ENCOUNTER — Telehealth: Payer: Self-pay | Admitting: General Surgery

## 2015-10-13 MED ORDER — TRAMADOL HCL 50 MG PO TABS: 50.0000 mg | ORAL_TABLET | Freq: Three times a day (TID) | ORAL | Status: DC | PRN

## 2015-10-13 NOTE — Telephone Encounter (Signed)
Called to report pain in her back and breast bone related to neulasta. States that she was told to call if pain did not get better. She is rating her pain at 3/10 with use of extra strength Tylenol. She uses [facility]

## 2015-10-13 NOTE — Telephone Encounter (Signed)
Per VO Dr Grayland Ormond Tramadol 50 mg q 8 h prn times 1 week

## 2015-10-13 NOTE — Telephone Encounter (Signed)
Patient informed of med faxed to [facility]

## 2015-10-13 NOTE — Telephone Encounter (Signed)
Patient didn't get her MRI and needs to reschedule that.

## 2015-10-14 NOTE — Telephone Encounter (Signed)
Patient rescheduled her own MRI until 10/20/15. Office appointment with Dr. Adonis Huguenin has been moved to 10/23/15 per Dr. Adonis Huguenin.

## 2015-10-15 ENCOUNTER — Ambulatory Visit: Payer: Self-pay | Admitting: General Surgery

## 2015-10-20 ENCOUNTER — Ambulatory Visit
Admission: RE | Admit: 2015-10-20 | Discharge: 2015-10-20 | Disposition: A | Discharge status: Home / Self Care | Payer: Medicare Other | Source: Ambulatory Visit | Attending: General Surgery | Admitting: General Surgery

## 2015-10-20 DIAGNOSIS — C50911 Malignant neoplasm of unspecified site of right female breast: Secondary | ICD-10-CM

## 2015-10-20 DIAGNOSIS — C50912 Malignant neoplasm of unspecified site of left female breast: Principal | ICD-10-CM

## 2015-10-20 MED ORDER — GADOBENATE DIMEGLUMINE 529 MG/ML IV SOLN
15.0000 mL | Freq: Once | INTRAVENOUS | Status: AC | PRN
  Administered 2015-10-20: 15 mL via INTRAVENOUS

## 2015-10-23 ENCOUNTER — Other Ambulatory Visit: Payer: Self-pay | Admitting: General Surgery

## 2015-10-23 ENCOUNTER — Ambulatory Visit (INDEPENDENT_AMBULATORY_CARE_PROVIDER_SITE_OTHER): Payer: Medicare Other | Admitting: General Surgery

## 2015-10-23 ENCOUNTER — Encounter: Payer: Self-pay | Admitting: General Surgery

## 2015-10-23 VITALS — BP 131/80 | HR 94 | Temp 97.7°F | Ht 67.0 in | Wt 160.0 lb

## 2015-10-23 DIAGNOSIS — Z853 Personal history of malignant neoplasm of breast: Secondary | ICD-10-CM | POA: Diagnosis not present

## 2015-10-23 NOTE — Patient Instructions (Signed)
Please look into your blue sheet if you have any surgical questions. However, if you have additional questions, please do not hesitate to give [place] a call.

## 2015-10-23 NOTE — Progress Notes (Signed)
Outpatient Surgical Follow Up  10/23/2015  Stephanie Crosby is an 80 y.o. female.   Chief Complaint  Patient presents with  . Follow-up    Bilateral Breast     HPI: 80 year old female who is well known to the surgery service returns to clinic today to discuss her surgical options. Patient is known to have bilateral breast cancer with a right-sided invasive ductal stage I and a left-sided invasive lobular stage II. Patient has completed 6 cycles of neoadjuvant chemotherapy and continues on Herceptin. She denies being able to palpate any masses on either side and returns to clinic today to discuss her recent MRI results. Patient states that her fatigue has improved dramatically after an infusion of iron last week. She denies any fevers, chills, chest pain, shortness of breath, diarrhea, constipation.  Past Medical History  Diagnosis Date  . Skin cancer 2011  . Back injury   . Hiatal hernia   . Anemia   . GERD (gastroesophageal reflux disease)   . Sleep apnea   . Breast cancer in situ 05/2015    Left  . Seasonal allergic rhinitis   . Gonalgia   . Breast cancer [facility])     Past Surgical History  Procedure Laterality Date  . Appendectomy    . Cataract extraction, bilateral Bilateral 2012  . Colonoscopy    . Portacath placement Right 06/10/2015    Procedure: INSERTION PORT-A-CATH;  Surgeon: [name], MD;  Location: [facility] ORS;  Service: General;  Laterality: Right;    Family History  Problem Relation Age of Onset  . Breast cancer Neg Hx   . Dementia Mother   . Hyperlipidemia Mother   . Hypertension Mother   . Congestive Heart Failure Mother   . Heart disease Mother   . Aneurysm Father     abdominal  . Fibromyalgia Sister   . Heart disease Sister   . Cancer Maternal Uncle     brain  . Heart disease Maternal Uncle   . Ovarian cancer Maternal Grandmother   . Cancer Cousin     breast cancer    Social History:  reports that she has never smoked. She has never used  smokeless tobacco. She reports that she drinks alcohol. She reports that she does not use illicit drugs.  Allergies:  Allergies  Allergen Reactions  . Penicillins Rash    Medications reviewed.    ROS  A multi-point review of systems was completed and all pertinent findings were documented in the history of present illness. The remainder are negative.  BP 131/80 mmHg  Pulse 94  Temp(Src) 97.7 F (36.5 C) (Oral)  Ht 5\' 7"  (1.702 m)  Wt 72.576 kg (160 lb)  BMI 25.05 kg/m2  Physical Exam  Gen.: No acute distress Neck: Supple and nontender Breast: Bilateral breast examined. No obvious palpable masses on either side. There is no evidence of palpable lymphadenopathy to either axilla. Chest: Clear to auscultation Heart: Tachycardic Abdomen: Soft and nontender   No results found for this or any previous visit (from the past 48 hour(s)). No results found. BILATERAL BREAST MRI WITH AND WITHOUT CONTRAST: 1. Response to therapy. No significant residual enhancement is seen within the left breast at site of known invasive lobular carcinoma. 2. No residual suspicious enhancement is seen at the site of prior biopsy within the upper, slightly outer right breast demonstrating invasive ductal carcinoma.  Assessment/Plan:  1. Personal history of breast cancer 80 year old female with previously biopsied bilateral breast cancer. Recent MRI  has shown a complete resolution of all cancer that is able to be noted on MRI. Discussed these results in detail with the patient and her husband. Discussed that in this case where it appears that it could be a complete pathologic response to chemo that an excisional biopsy is warranted to insure that this is actually a complete response. The procedure of a bilateral needle localized breast biopsy was discussed in detail and all questions were answered to her satisfaction. Plan for excisional biopsy for July 17th. - MM [facility] BREAST LOC DEV   1ST LESION   INC MAMMO GUIDE; Future - MM [facility] BREAST LOC DEV   EA ADD LESION  INC MAMMO GUIDE; Future     [name], MD [facility] General Surgeon  10/23/2015,1:20 PM

## 2015-10-26 ENCOUNTER — Telehealth: Payer: Self-pay | Admitting: General Surgery

## 2015-10-26 NOTE — Telephone Encounter (Signed)
Pt advised of pre op date/time and sx date. Sx: 11/18/15 with Dr Nathanial Rancher Needle localization biopsy. Pre op: 11/09/15 between 9-1:00pm--Phone.   Patient advised to arrive at [facility] at 8:00am the day of surgery.

## 2015-10-27 ENCOUNTER — Inpatient Hospital Stay: Payer: Medicare Other

## 2015-10-27 ENCOUNTER — Inpatient Hospital Stay: Payer: Medicare Other | Admitting: Oncology

## 2015-10-27 ENCOUNTER — Inpatient Hospital Stay (HOSPITAL_BASED_OUTPATIENT_CLINIC_OR_DEPARTMENT_OTHER): Payer: Medicare Other | Admitting: Oncology

## 2015-10-27 VITALS — BP 152/91 | HR 91 | Temp 97.3°F | Resp 18 | Wt 160.3 lb

## 2015-10-27 DIAGNOSIS — Z170 Estrogen receptor positive status [ER+]: Secondary | ICD-10-CM

## 2015-10-27 DIAGNOSIS — R911 Solitary pulmonary nodule: Secondary | ICD-10-CM

## 2015-10-27 DIAGNOSIS — M899 Disorder of bone, unspecified: Secondary | ICD-10-CM

## 2015-10-27 DIAGNOSIS — K219 Gastro-esophageal reflux disease without esophagitis: Secondary | ICD-10-CM

## 2015-10-27 DIAGNOSIS — C50912 Malignant neoplasm of unspecified site of left female breast: Secondary | ICD-10-CM

## 2015-10-27 DIAGNOSIS — Z79899 Other long term (current) drug therapy: Secondary | ICD-10-CM

## 2015-10-27 DIAGNOSIS — D649 Anemia, unspecified: Secondary | ICD-10-CM | POA: Diagnosis not present

## 2015-10-27 DIAGNOSIS — H579 Unspecified disorder of eye and adnexa: Secondary | ICD-10-CM

## 2015-10-27 DIAGNOSIS — C50911 Malignant neoplasm of unspecified site of right female breast: Secondary | ICD-10-CM

## 2015-10-27 DIAGNOSIS — D509 Iron deficiency anemia, unspecified: Secondary | ICD-10-CM

## 2015-10-27 LAB — CBC WITH DIFFERENTIAL/PLATELET
BASOS ABS: 0 10*3/uL (ref 0–0.1)
Basophils Relative: 1 %
EOS PCT: 0 %
Eosinophils Absolute: 0 10*3/uL (ref 0–0.7)
HCT: 34.1 % — ABNORMAL LOW (ref 35.0–47.0)
HEMOGLOBIN: 10.9 g/dL — AB (ref 12.0–16.0)
LYMPHS ABS: 1.1 10*3/uL (ref 1.0–3.6)
LYMPHS PCT: 31 %
MCH: 28.9 pg (ref 26.0–34.0)
MCHC: 32.1 g/dL (ref 32.0–36.0)
MCV: 90.3 fL (ref 80.0–100.0)
Monocytes Absolute: 0.5 10*3/uL (ref 0.2–0.9)
Monocytes Relative: 14 %
NEUTROS PCT: 54 %
Neutro Abs: 1.9 10*3/uL (ref 1.4–6.5)
Platelets: 231 10*3/uL (ref 150–440)
RBC: 3.78 MIL/uL — AB (ref 3.80–5.20)
RDW: 21.3 % — ABNORMAL HIGH (ref 11.5–14.5)
WBC: 3.5 10*3/uL — AB (ref 3.6–11.0)

## 2015-10-27 LAB — COMPREHENSIVE METABOLIC PANEL
ALBUMIN: 3.3 g/dL — AB (ref 3.5–5.0)
ALK PHOS: 62 U/L (ref 38–126)
ALT: 14 U/L (ref 14–54)
ANION GAP: 5 (ref 5–15)
AST: 21 U/L (ref 15–41)
BUN: 16 mg/dL (ref 6–20)
CALCIUM: 9.3 mg/dL (ref 8.9–10.3)
CHLORIDE: 106 mmol/L (ref 101–111)
CO2: 27 mmol/L (ref 22–32)
Creatinine, Ser: 0.64 mg/dL (ref 0.44–1.00)
GFR calc Af Amer: >60 mL/min (ref >60)
GFR calc non Af Amer: >60 mL/min (ref >60)
GLUCOSE: 109 mg/dL — AB (ref 65–99)
Potassium: 4.1 mmol/L (ref 3.5–5.1)
SODIUM: 138 mmol/L (ref 135–145)
Total Bilirubin: 0.4 mg/dL (ref 0.3–1.2)
Total Protein: 5.8 g/dL — ABNORMAL LOW (ref 6.5–8.1)

## 2015-10-27 MED ORDER — HEPARIN SOD (PORK) LOCK FLUSH 100 UNIT/ML IV SOLN
500.0000 [IU] | Freq: Once | INTRAVENOUS | Status: AC
  Administered 2015-10-27: 500 [IU] via INTRAVENOUS

## 2015-10-27 MED ORDER — HEPARIN SOD (PORK) LOCK FLUSH 100 UNIT/ML IV SOLN
INTRAVENOUS | Status: AC
  Filled 2015-10-27: qty 5

## 2015-10-27 MED ORDER — ACETAMINOPHEN 325 MG PO TABS
650.0000 mg | ORAL_TABLET | Freq: Once | ORAL | Status: AC
  Administered 2015-10-27: 650 mg via ORAL
  Filled 2015-10-27: qty 2

## 2015-10-27 MED ORDER — TRASTUZUMAB CHEMO INJECTION 440 MG
6.0000 mg/kg | Freq: Once | INTRAVENOUS | Status: AC
  Administered 2015-10-27: 462 mg via INTRAVENOUS
  Filled 2015-10-27: qty 22

## 2015-10-27 MED ORDER — SODIUM CHLORIDE 0.9 % IV SOLN
Freq: Once | INTRAVENOUS | Status: AC
  Administered 2015-10-27: 12:00:00 via INTRAVENOUS
  Filled 2015-10-27: qty 1000

## 2015-10-27 MED ORDER — DIPHENHYDRAMINE HCL 25 MG PO CAPS
25.0000 mg | ORAL_CAPSULE | Freq: Once | ORAL | Status: AC
  Administered 2015-10-27: 25 mg via ORAL
  Filled 2015-10-27: qty 1

## 2015-10-27 MED ORDER — SODIUM CHLORIDE 0.9% FLUSH
10.0000 mL | INTRAVENOUS | Status: DC | PRN
  Filled 2015-10-27: qty 10

## 2015-10-27 MED ORDER — SODIUM CHLORIDE 0.9 % IV SOLN
510.0000 mg | Freq: Once | INTRAVENOUS | Status: AC
  Administered 2015-10-27: 510 mg via INTRAVENOUS
  Filled 2015-10-27: qty 17

## 2015-10-27 NOTE — Progress Notes (Signed)
Feet/ankles swelling for past 4 days. Swelling improves at night and worsens during the day with activity. Eyes continue to water. Nails are discolored from chemo treatments. Continues to have occasional diarrhea.

## 2015-10-27 NOTE — Progress Notes (Signed)
[facility]  Telephone:([phone] [phone]  ID: Stephanie Crosby OB: 02-27-1936  MR#: [id number]  AOZ#:[id number]  Patient Care Team: Jerl Mina, MD as PCP - General (Family Medicine) Ricarda Frame, MD as Consulting Physician (General Surgery)  CHIEF COMPLAINT:  Chief Complaint  Patient presents with  . Breast Cancer    INTERVAL HISTORY: Patient returns to clinic today for further evaluation and consideration of cycle 7 of Herceptin only. She has now completed her neoadjuvant chemotherapy. Patient had breast MRI which suggests a complete pathologic response. She has her surgery scheduled in approximately 3 weeks. She has noted increased bilateral ankle edema, but otherwise feels well. She does not complain of thickened watery eye drainage today. She otherwise feels well.  She has no neurologic complaints. She denies any fevers. She denies any pain. She has no chest pain or shortness of breath. She denies any nausea, vomiting, constipation or diarrhea. She has no urinary complaints. Patient offers no further specific complaints.  REVIEW OF SYSTEMS:   Review of Systems  Constitutional: Negative for fever, weight loss and malaise/fatigue.  HENT: Negative for sore throat.   Eyes: Positive for discharge. Negative for blurred vision, double vision, photophobia, pain and redness.  Respiratory: Negative.   Cardiovascular: Positive for leg swelling.  Gastrointestinal: Negative for diarrhea, blood in stool and melena.  Musculoskeletal: Negative.   Skin: Negative.   Neurological: Negative.  Negative for weakness.  Psychiatric/Behavioral: Negative.     As per HPI. Otherwise, a complete review of systems is negatve.  PAST MEDICAL HISTORY: Past Medical History  Diagnosis Date  . Skin cancer 2011  . Back injury   . Hiatal hernia   . Anemia   . GERD (gastroesophageal reflux disease)   . Sleep apnea   . Breast cancer in situ 05/2015    Left  . Seasonal  allergic rhinitis   . Gonalgia   . Breast cancer (HCC)     PAST SURGICAL HISTORY: Past Surgical History  Procedure Laterality Date  . Appendectomy    . Cataract extraction, bilateral Bilateral 2012  . Colonoscopy    . Portacath placement Right 06/10/2015    Procedure: INSERTION PORT-A-CATH;  Surgeon: Ricarda Frame, MD;  Location: [facility] ORS;  Service: General;  Laterality: Right;    FAMILY HISTORY: Patient reports a maternal aunt with breast cancer as well as a female cousin with breast cancer.  Family History  Problem Relation Age of Onset  . Breast cancer Neg Hx   . Dementia Mother   . Hyperlipidemia Mother   . Hypertension Mother   . Congestive Heart Failure Mother   . Heart disease Mother   . Aneurysm Father     abdominal  . Fibromyalgia Sister   . Heart disease Sister   . Cancer Maternal Uncle     brain  . Heart disease Maternal Uncle   . Ovarian cancer Maternal Grandmother   . Cancer Cousin     breast cancer       ADVANCED DIRECTIVES:    HEALTH MAINTENANCE: Social History  Substance Use Topics  . Smoking status: Never Smoker   . Smokeless tobacco: Never Used  . Alcohol Use: 0.0 oz/week    0 Standard drinks or equivalent per week     Comment: 3 glasses wine/week      Allergies  Allergen Reactions  . Penicillins Rash    Current Outpatient Prescriptions  Medication Sig Dispense Refill  . fluorometholone (FML) 0.1 % ophthalmic suspension 1 drop as  needed.    . lidocaine-prilocaine (EMLA) cream Apply topically once.    . Multiple Vitamins-Minerals (CENTRUM SILVER PO) Take by mouth. Reported on 06/15/2015    . omeprazole (PRILOSEC) 20 MG capsule Take 1 capsule by mouth daily. Reported on 06/15/2015    . ondansetron (ZOFRAN) 8 MG tablet Take 1 tablet by mouth 3 (three) times daily as needed. Reported on 06/15/2015    . traMADol (ULTRAM) 50 MG tablet Take 1 tablet (50 mg total) by mouth every 8 (eight) hours as needed. 20 tablet 0  . valACYclovir (VALTREX) 500  MG tablet Take by mouth as needed. Reported on 06/15/2015     No current facility-administered medications for this visit.   Facility-Administered Medications Ordered in Other Visits  Medication Dose Route Frequency Provider Last Rate Last Dose  . sodium chloride flush (NS) 0.9 % injection 10 mL  10 mL Intracatheter PRN Lloyd Huger, MD      . sodium chloride flush (NS) 0.9 % injection 10 mL  10 mL Intravenous PRN Lloyd Huger, MD        OBJECTIVE: Filed Vitals:   10/27/15 1129  BP: 152/91  Pulse: 91  Temp: 97.3 F (36.3 C)  Resp: 18     Body mass index is 25.1 kg/(m^2).    ECOG FS:0 - Asymptomatic  General: Well-developed, well-nourished, no acute distress. Eyes: Pink conjunctiva, anicteric sclera. Breasts: Exam be deferred today. Lungs: Clear to auscultation bilaterally. Heart: Regular rate and rhythm. No rubs, murmurs, or gallops. Abdomen: Soft, nontender, nondistended. No organomegaly noted, normoactive bowel sounds. Musculoskeletal: No edema, cyanosis, or clubbing. Neuro: Alert, answering all questions appropriately. Cranial nerves grossly intact. Skin: ecchymosis right breast. Psych: Normal affect.   LAB RESULTS:  Lab Results  Component Value Date   NA 138 10/27/2015   K 4.1 10/27/2015   CL 106 10/27/2015   CO2 27 10/27/2015   GLUCOSE 109* 10/27/2015   BUN 16 10/27/2015   CREATININE 0.64 10/27/2015   CALCIUM 9.3 10/27/2015   PROT 5.8* 10/27/2015   ALBUMIN 3.3* 10/27/2015   AST 21 10/27/2015   ALT 14 10/27/2015   ALKPHOS 62 10/27/2015   BILITOT 0.4 10/27/2015   GFRNONAA >60 10/27/2015   GFRAA >60 10/27/2015    Lab Results  Component Value Date   WBC 3.5* 10/27/2015   NEUTROABS 1.9 10/27/2015   HGB 10.9* 10/27/2015   HCT 34.1* 10/27/2015   MCV 90.3 10/27/2015   PLT 231 10/27/2015   Lab Results  Component Value Date   IRON 10* 09/15/2015   TIBC 395 09/15/2015   IRONPCTSAT 3* 09/15/2015   Lab Results  Component Value Date   FERRITIN  14 09/15/2015     STUDIES: Mr Breast Bilateral W Wo Contrast  10/21/2015  CLINICAL DATA:  Re-evaluation following neoadjuvant therapy. The patient has a history of left breast invasive lobular carcinoma and right breast invasive ductal carcinoma. LABS:  No labs were performed at the imaging center today. EXAM: BILATERAL BREAST MRI WITH AND WITHOUT CONTRAST TECHNIQUE: Multiplanar, multisequence MR images of both breasts were obtained prior to and following the intravenous administration of 15 ml of MultiHance. THREE-DIMENSIONAL MR IMAGE RENDERING ON INDEPENDENT WORKSTATION: Three-dimensional MR images were rendered by post-processing of the original MR data on an independent workstation. The three-dimensional MR images were interpreted, and findings are reported in the following complete MRI report for this study. Three dimensional images were evaluated at the independent DynaCad workstation COMPARISON:  Previous exams including breast MRI dated 05/19/2015  FINDINGS: Breast composition: c. Heterogeneous fibroglandular tissue. Background parenchymal enhancement: Moderate. Right breast: Biopsy marker artifact is seen within the upper, slightly outer right breast at the site of previous biopsy demonstrating invasive ductal carcinoma. No residual suspicious enhancement is noted surrounding the biopsy site. Left breast: No significant residual enhancement is seen within the superior left breast at site of biopsy proven invasive lobular carcinoma Lymph nodes: No abnormal appearing lymph nodes. Ancillary findings:  None. IMPRESSION: 1. Response to therapy. No significant residual enhancement is seen within the left breast at site of known invasive lobular carcinoma. 2. No residual suspicious enhancement is seen at the site of prior biopsy within the upper, slightly outer right breast demonstrating invasive ductal carcinoma. RECOMMENDATION: Treatment plan. BI-RADS CATEGORY  6: Known biopsy-proven malignancy.  Electronically Signed   By: Pamelia Hoit M.D.   On: 10/21/2015 11:30    ASSESSMENT:   1. Clinical stage IIb ER positive, PR negative, HER-2 overexpressing invasive lobular cancer of the left breast. 2. Clinical stage Ia ER/PR positive, HER-2 negative invasive ductal carcinoma of the right breast. 3. BRCA1 and 2 negative.  PLAN:    1. Left breast cancer: MRI results reviewed independently suggesting complete pathologic response. Patient has her surgery in approximately 3 weeks. Patient has now completed 6 cycles of chemotherapy, but will continue with adjuvant Herceptin only. Proceed with cycle 7 of 18 today. Patient will require MUGA scanning in the next 1-2 weeks. She will also require an aromatase inhibitor after completion of adjuvant XRT. Return to clinic in 4 weeks rather than 3 because of her surgery for consideration of cycle 8 of Herceptin only. 2. Right breast cancer: MRI results also suggest complete pathological response in right breast. Patient will likely require lumpectomy followed by XRT of her right breast. Aromatase inhibitor for 5 years as above. 3. Pulmonary nodule: CT scan of the chest, abdomen, and pelvis revealed a 5 mm pulmonary nodule too small to characterize as well as a lesion in her L4 vertebrae that appears to be benign. Will repeat CT of chest in one year.   4. Anemia: Hemoglobin slightly improved. Iron stores as above. Patient will receive her second infusion of 510 mg IV Feraheme today.  5. Eye drainage: Possible allergies. Continue OTC medications as directed.  6. Acid Reflux: Continue omeprazole. 7. Lower extremity edema:  Repeat MUGA in next 1-2 weeks.  Patient expressed understanding and was in agreement with this plan. She also understands that She can call clinic at any time with any questions, concerns, or complaints.    Lloyd Huger, MD   10/27/2015 1:43 PM

## 2015-11-09 ENCOUNTER — Telehealth: Payer: Self-pay

## 2015-11-09 ENCOUNTER — Inpatient Hospital Stay: Admission: RE | Admit: 2015-11-09 | Payer: Medicare Other | Source: Ambulatory Visit

## 2015-11-09 NOTE — Telephone Encounter (Signed)
I have spoke with patient's Husband about date of surgery that was changed.   He stated that he would make the patient aware of the date of surgery and arrival time for surgery. I have asked for him to have the patient call if she had any questions prior to surgery.    Sx: 11/17/15 with Dr Nathanial Rancher Needle Localization Breast Biopsy.  Pre op: 11/09/15 between 9-1:00pm--phone.   Patient's husband was made aware for patient to arrive at [facility] at 12:15pm the day of surgery--11/17/15.

## 2015-11-09 NOTE — Patient Instructions (Signed)
  Your procedure is scheduled on: 11-18-15 Report to [facility] AT 8 AM  Remember: Instructions that are not followed completely may result in serious medical risk, up to and including death, or upon the discretion of your surgeon and anesthesiologist your surgery may need to be rescheduled.    _x___ 1. Do not eat food or drink liquids after midnight. No gum chewing or hard candies.     __x__ 2. No Alcohol for 24 hours before or after surgery.   __x__3. No Smoking for 24 prior to surgery.   ____  4. Bring all medications with you on the day of surgery if instructed.    __x__ 5. Notify your doctor if there is any change in your medical condition     (cold, fever, infections).     Do not wear jewelry, make-up, hairpins, clips or nail polish.  Do not wear lotions, powders, or perfumes. You may wear deodorant.  Do not shave 48 hours prior to surgery. Men may shave face and neck.  Do not bring valuables to the hospital.    [facility] is not responsible for any belongings or valuables.               Contacts, dentures or bridgework may not be worn into surgery.  Leave your suitcase in the car. After surgery it may be brought to your room.  For patients admitted to the hospital, discharge time is determined by your treatment team.   Patients discharged the day of surgery will not be allowed to drive home.    Please read over the following fact sheets that you were given:   [facility] Preparing for Surgery and or MRSA Information   _x___ Take these medicines the morning of surgery with A SIP OF WATER:    1. PRILOSEC  2. TAKE AN EXTRA PRILOSEC ON Tuesday NIGHT BEFORE BED  3.  4.  5.  6.  ____ Fleet Enema (as directed)   ____ Use CHG Soap or sage wipes as directed on instruction sheet   ____ Use inhalers on the day of surgery and bring to hospital day of surgery  ____ Stop metformin 2 days prior to surgery    ____ Take 1/2 of usual insulin dose the night before  surgery and none on the morning of surgery.   ____ Stop aspirin or coumadin, or plavix  _x__ Stop Anti-inflammatories such as Advil, Aleve, Ibuprofen, Motrin, Naproxen,          Naprosyn, Goodies powders or aspirin products. Ok to take Tylenol OR TRAMADOL    ____ Stop supplements until after surgery.    ____ Bring C-Pap to the hospital.

## 2015-11-09 NOTE — Telephone Encounter (Signed)
Call was made to patient at this time as the surgery date she has requested is unavailable for Dr. Adonis Huguenin. She would like to stay with Dr. Adonis Huguenin and just change her day of surgery.   Spoke with Angie at this time and she is working on changing this surgery date and will let me know what date to confirm with patient.

## 2015-11-10 ENCOUNTER — Ambulatory Visit
Admission: RE | Admit: 2015-11-10 | Discharge: 2015-11-10 | Disposition: A | Discharge status: Home / Self Care | Payer: Medicare Other | Source: Ambulatory Visit | Attending: General Surgery | Admitting: General Surgery

## 2015-11-10 ENCOUNTER — Other Ambulatory Visit: Payer: Self-pay

## 2015-11-10 DIAGNOSIS — Z01810 Encounter for preprocedural cardiovascular examination: Secondary | ICD-10-CM | POA: Diagnosis not present

## 2015-11-10 DIAGNOSIS — K449 Diaphragmatic hernia without obstruction or gangrene: Secondary | ICD-10-CM | POA: Insufficient documentation

## 2015-11-10 DIAGNOSIS — J90 Pleural effusion, not elsewhere classified: Secondary | ICD-10-CM | POA: Diagnosis not present

## 2015-11-10 DIAGNOSIS — J9811 Atelectasis: Secondary | ICD-10-CM | POA: Insufficient documentation

## 2015-11-11 ENCOUNTER — Ambulatory Visit: Payer: Medicare Other

## 2015-11-13 ENCOUNTER — Encounter: Payer: Self-pay | Admitting: *Deleted

## 2015-11-13 NOTE — Pre-Procedure Instructions (Signed)
Spoke with Museum/gallery conservator at Dr Lamont Snowball office regarding medical clearance and informed her that i have faxed everything over to Dr [facility] office

## 2015-11-13 NOTE — Pre-Procedure Instructions (Signed)
CALLED DR Marcello Moores REGARDING ABNORMAL CXR THAT DR Adonis Huguenin ORDERED THAT SHOWED SMALL EFFUSIONS-DR THOMAS WANTS PT TO GET MEDICAL CLEARANCE-I CALLED DR HEDRICKS OFFICE AND SPOKE WITH RECEPTIONIST ABOUT THIS AND THAT IF PT NEEDS TO COME IN THEY NEED TO CALL PT- I  FAXED CXR AND CLEARANCE REQUEST TO DR HEDRICK AND RECEIVED CONFIRMATION THAT IT WENT THRU-I CALLED TO SPEAK WITH ANGIE AT DR [facility] OFFICE BUT SHE WAS ON THE OTHER LINE SO I TOLD AMY TO GET HER TO CALL ME BACK- I DID FAX THE CLEARANCE REQUEST AND CXR OVER TO DR Lamont Snowball WITH FAX CONFIRMATION RECEIVED

## 2015-11-16 NOTE — Pre-Procedure Instructions (Signed)
AMY FROM DR HEDRICKS OFFICE CALLED AND SAID THAT DR Bunn WAS NOT IN THE OFFICE ON Friday AND THEREFORE DID NOT LOOK AT CXR UNTIL THIS AM- THEY ARE UNABLE TO SEE PT TODAY FOR HER MEDICAL CLEARANCE.  PT HAS APPOINTMENT WITH DANIELLE, PA AT DR HEDRICKS OFFICE TOMORROW (11-17-15) @ 9;30 AM, WHICH IS THE DAY OF HER SURGERY-PT IS NOT TO REPORT FOR HER SURGERY UNTIL 12:30PM- AMY WAS WANTING TO MAKE SURE THIS APPT WAS OK GIVEN IT IS THE DAY OF HER SURGERY-I TOLD HER WE HAVE NO OTHER OPTIONS AT THIS POINT AND TO PROCEED WITH HER CLEARANCE APPT FOR TOMORROW- I CALLED ANGIE AT DR Lamont Snowball OFFICE THIS MORNING AND INFORMED HER OF THIS

## 2015-11-17 ENCOUNTER — Ambulatory Visit: Payer: Medicare Other | Admitting: Anesthesiology

## 2015-11-17 ENCOUNTER — Ambulatory Visit
Admission: RE | Admit: 2015-11-17 | Discharge: 2015-11-17 | Disposition: A | Discharge status: Home / Self Care | Payer: Medicare Other | Source: Ambulatory Visit | Attending: General Surgery | Admitting: General Surgery

## 2015-11-17 ENCOUNTER — Encounter: Payer: Self-pay | Admitting: *Deleted

## 2015-11-17 ENCOUNTER — Ambulatory Visit: Payer: Medicare Other

## 2015-11-17 ENCOUNTER — Encounter
Admission: RE | Disposition: A | Discharge status: Home / Self Care | Payer: Self-pay | Source: Ambulatory Visit | Attending: General Surgery

## 2015-11-17 DIAGNOSIS — Z853 Personal history of malignant neoplasm of breast: Secondary | ICD-10-CM | POA: Insufficient documentation

## 2015-11-17 DIAGNOSIS — Z8041 Family history of malignant neoplasm of ovary: Secondary | ICD-10-CM | POA: Diagnosis not present

## 2015-11-17 DIAGNOSIS — K449 Diaphragmatic hernia without obstruction or gangrene: Secondary | ICD-10-CM | POA: Insufficient documentation

## 2015-11-17 DIAGNOSIS — Z880 Allergy status to penicillin: Secondary | ICD-10-CM | POA: Diagnosis not present

## 2015-11-17 DIAGNOSIS — Z803 Family history of malignant neoplasm of breast: Secondary | ICD-10-CM | POA: Diagnosis not present

## 2015-11-17 DIAGNOSIS — D649 Anemia, unspecified: Secondary | ICD-10-CM | POA: Insufficient documentation

## 2015-11-17 DIAGNOSIS — K219 Gastro-esophageal reflux disease without esophagitis: Secondary | ICD-10-CM | POA: Insufficient documentation

## 2015-11-17 DIAGNOSIS — Z170 Estrogen receptor positive status [ER+]: Secondary | ICD-10-CM | POA: Diagnosis not present

## 2015-11-17 DIAGNOSIS — G4730 Sleep apnea, unspecified: Secondary | ICD-10-CM | POA: Diagnosis not present

## 2015-11-17 DIAGNOSIS — Z9841 Cataract extraction status, right eye: Secondary | ICD-10-CM | POA: Diagnosis not present

## 2015-11-17 DIAGNOSIS — Z808 Family history of malignant neoplasm of other organs or systems: Secondary | ICD-10-CM | POA: Insufficient documentation

## 2015-11-17 DIAGNOSIS — Z85828 Personal history of other malignant neoplasm of skin: Secondary | ICD-10-CM | POA: Insufficient documentation

## 2015-11-17 DIAGNOSIS — C50012 Malignant neoplasm of nipple and areola, left female breast: Secondary | ICD-10-CM | POA: Insufficient documentation

## 2015-11-17 DIAGNOSIS — Z820 Family history of epilepsy and other diseases of the nervous system: Secondary | ICD-10-CM | POA: Insufficient documentation

## 2015-11-17 DIAGNOSIS — Z9221 Personal history of antineoplastic chemotherapy: Secondary | ICD-10-CM | POA: Insufficient documentation

## 2015-11-17 DIAGNOSIS — Z9842 Cataract extraction status, left eye: Secondary | ICD-10-CM | POA: Diagnosis not present

## 2015-11-17 DIAGNOSIS — Z8249 Family history of ischemic heart disease and other diseases of the circulatory system: Secondary | ICD-10-CM | POA: Diagnosis not present

## 2015-11-17 HISTORY — PX: BREAST BIOPSY: SHX20

## 2015-11-17 HISTORY — DX: Localized edema: R60.0

## 2015-11-17 SURGERY — BREAST BIOPSY WITH NEEDLE LOCALIZATION
Anesthesia: General | Laterality: Bilateral

## 2015-11-17 MED ORDER — CLINDAMYCIN PHOSPHATE 600 MG/50ML IV SOLN
600.0000 mg | INTRAVENOUS | Status: AC
  Administered 2015-11-17: 600 mg via INTRAVENOUS

## 2015-11-17 MED ORDER — LIDOCAINE-EPINEPHRINE (PF) 1 %-1:200000 IJ SOLN
INTRAMUSCULAR | Status: AC
  Filled 2015-11-17: qty 30

## 2015-11-17 MED ORDER — FENTANYL CITRATE (PF) 100 MCG/2ML IJ SOLN
INTRAMUSCULAR | Status: DC | PRN
  Administered 2015-11-17 (×2): 50 ug via INTRAVENOUS

## 2015-11-17 MED ORDER — BUPIVACAINE HCL (PF) 0.5 % IJ SOLN
INTRAMUSCULAR | Status: AC
  Filled 2015-11-17: qty 30

## 2015-11-17 MED ORDER — CHLORHEXIDINE GLUCONATE CLOTH 2 % EX PADS
6.0000 | MEDICATED_PAD | Freq: Once | CUTANEOUS | Status: AC
  Administered 2015-11-17: 6 via TOPICAL

## 2015-11-17 MED ORDER — BUPIVACAINE HCL (PF) 0.5 % IJ SOLN
INTRAMUSCULAR | Status: DC | PRN
  Administered 2015-11-17: 15 mL

## 2015-11-17 MED ORDER — LIDOCAINE-EPINEPHRINE (PF) 1 %-1:200000 IJ SOLN
INTRAMUSCULAR | Status: DC | PRN
  Administered 2015-11-17: 15 mL

## 2015-11-17 MED ORDER — FENTANYL CITRATE (PF) 100 MCG/2ML IJ SOLN: 25.0000 ug | INTRAMUSCULAR | Status: DC | PRN

## 2015-11-17 MED ORDER — LACTATED RINGERS IV SOLN
INTRAVENOUS | Status: DC
  Administered 2015-11-17 (×2): via INTRAVENOUS

## 2015-11-17 MED ORDER — CLINDAMYCIN PHOSPHATE 600 MG/50ML IV SOLN
INTRAVENOUS | Status: AC
  Administered 2015-11-17: 600 mg via INTRAVENOUS
  Filled 2015-11-17: qty 50

## 2015-11-17 MED ORDER — LIDOCAINE HCL (CARDIAC) 20 MG/ML IV SOLN
INTRAVENOUS | Status: DC | PRN
  Administered 2015-11-17: 100 mg via INTRAVENOUS

## 2015-11-17 MED ORDER — PROPOFOL 10 MG/ML IV BOLUS
INTRAVENOUS | Status: DC | PRN
  Administered 2015-11-17: 160 mg via INTRAVENOUS

## 2015-11-17 MED ORDER — ONDANSETRON HCL 4 MG/2ML IJ SOLN: 4.0000 mg | Freq: Once | INTRAMUSCULAR | Status: DC | PRN

## 2015-11-17 MED ORDER — EPHEDRINE SULFATE 50 MG/ML IJ SOLN
INTRAMUSCULAR | Status: DC | PRN
  Administered 2015-11-17 (×3): 10 mg via INTRAVENOUS

## 2015-11-17 MED ORDER — CHLORHEXIDINE GLUCONATE CLOTH 2 % EX PADS: 6.0000 | MEDICATED_PAD | Freq: Once | CUTANEOUS | Status: DC

## 2015-11-17 SURGICAL SUPPLY — 29 items
BLADE SURG 15 STRL LF DISP TIS (BLADE) ×1 IMPLANT
BLADE SURG 15 STRL SS (BLADE) ×2
CANISTER SUCT 1200ML W/VALVE (MISCELLANEOUS) ×3 IMPLANT
CHLORAPREP W/TINT 26ML (MISCELLANEOUS) ×3 IMPLANT
COVER PROBE FLX POLY STRL (MISCELLANEOUS) ×3 IMPLANT
DEVICE DUBIN SPECIMEN MAMMOGRA (MISCELLANEOUS) ×3 IMPLANT
DRAPE LAPAROTOMY TRNSV 106X77 (MISCELLANEOUS) ×3 IMPLANT
ELECT CAUTERY BLADE 6.4 (BLADE) ×3 IMPLANT
ELECT REM PT RETURN 9FT ADLT (ELECTROSURGICAL) ×3
ELECTRODE REM PT RTRN 9FT ADLT (ELECTROSURGICAL) ×1 IMPLANT
GLOVE BIO SURGEON STRL SZ7.5 (GLOVE) ×12 IMPLANT
GLOVE INDICATOR 8.0 STRL GRN (GLOVE) ×3 IMPLANT
GOWN STRL REUS W/ TWL LRG LVL3 (GOWN DISPOSABLE) ×4 IMPLANT
GOWN STRL REUS W/ TWL XL LVL3 (GOWN DISPOSABLE) ×1 IMPLANT
GOWN STRL REUS W/TWL LRG LVL3 (GOWN DISPOSABLE) ×8
GOWN STRL REUS W/TWL XL LVL3 (GOWN DISPOSABLE) ×2
LABEL OR SOLS (LABEL) IMPLANT
LIQUID BAND (GAUZE/BANDAGES/DRESSINGS) ×3 IMPLANT
MARGIN MAP 10MM (MISCELLANEOUS) ×3 IMPLANT
NEEDLE HYPO 25X1 1.5 SAFETY (NEEDLE) ×3 IMPLANT
PACK BASIN MINOR ARMC (MISCELLANEOUS) ×3 IMPLANT
SUT MNCRL 4-0 (SUTURE) ×2
SUT MNCRL 4-0 27XMFL (SUTURE) ×1
SUT SILK 2 0 SH (SUTURE) ×3 IMPLANT
SUT VIC AB 3-0 SH 27 (SUTURE) ×4
SUT VIC AB 3-0 SH 27X BRD (SUTURE) ×2 IMPLANT
SUTURE MNCRL 4-0 27XMF (SUTURE) ×1 IMPLANT
SYRINGE 10CC LL (SYRINGE) ×3 IMPLANT
WATER STERILE IRR 1000ML POUR (IV SOLUTION) ×3 IMPLANT

## 2015-11-17 NOTE — Anesthesia Procedure Notes (Signed)
Procedure Name: LMA Insertion Date/Time: 11/17/2015 2:13 PM Performed by: Justus Memory Pre-anesthesia Checklist: Patient identified, Suction available, Emergency Drugs available and Patient being monitored Patient Re-evaluated:Patient Re-evaluated prior to inductionOxygen Delivery Method: Circle system utilized Preoxygenation: Pre-oxygenation with 100% oxygen Intubation Type: IV induction Ventilation: Mask ventilation without difficulty LMA: LMA inserted LMA Size: 3.5 Tube size: 3.5 mm Placement Confirmation: positive ETCO2 and breath sounds checked- equal and bilateral

## 2015-11-17 NOTE — Op Note (Signed)
   Pre-operative Diagnosis: History of bilateral breast cancer  Post-operative Diagnosis: same   Surgeon: [name]   Assistants: none  Anesthesia: General endotracheal anesthesia  ASA Class: 2  Surgeon: [name], MD FACS  Anesthesia: Gen. with endotracheal tube  Assistant:None  Procedure Details  The patient was seen again in the Holding Room. The benefits, complications, treatment options, and expected outcomes were discussed with the patient. The risks of bleeding, infection, discovery of cancer, any of which could require further surgery were reviewed with the patient.   The patient was taken to Operating Room, identified as Stephanie Crosby and the procedure verified.  A Time Out was held and the above information confirmed.  Prior to the induction of general anesthesia, antibiotic prophylaxis was administered. VTE prophylaxis was in place. General endotracheal anesthesia was then administered and tolerated well. After the induction, the chest was prepped with Chloraprep and draped in the sterile fashion. The patient was positioned in the supine position.  The previously placed wires were again visualized on the right and left breast. Starting on the left side and incision was made perpendicular to the nipple areolar complex just caudad of the previously placed wire. Using blunt dissection the wire was identified and brought interoperative field. Circumferentially using Bovie electrocautery the dissection was taken down until we were beyond the tip of the placed wire and the mass was excised in toto. Any areas of bleeding were made hemostatic with direct elect cautery. The area was completely irrigated and again hemostasis was insured. The deep tissues were approximately with interrupted 3-0 Vicryl suture. The skin was closed with a running septic therefore Monocryl.  We then turned our attention to the patient's right side and did the identical procedure. The skin was  incised with 15 blade scalpel and Bovie much cautery was taken down around the wire until we were comfortably beyond the wire. This allowed the specimen to be extracted in toto without visualization of the wire or clips. The distal wire and clips were not identified bilaterally. Both specimens were oriented with the provided specimen orientation tabs.  The specimens were sent to radiology and both specimens showed presence of the biopsy clip within the middle of the specimen. The right side was then closed the same as the left with a deepened through Vicryl in a running subcutaneous cuticular 4-0 Monocryl. Bilateral sides were localized the 50-50 mixer 1% lidocaine 0.5% Marcaine. Local was used at the beginning and the end of the case. With the skin closed the sites were sealed with liquid band. Patient tolerated procedure well was awoken from general anesthesia and transferred to the PACU in good condition. All counts were correct at the end the procedure and there were no immediate Complications.  Findings: Right and Left breast tissue   Estimated Blood Loss: 20 mL         Drains: none         Specimens: Right and Left breast tissue          Complications: None                  Condition: Good   [name], MD, FACS

## 2015-11-17 NOTE — Discharge Instructions (Signed)
Lumpectomy, Care After Refer to this sheet in the next few weeks. These instructions provide you with information on caring for yourself after your procedure. Your health care provider may also give you more specific instructions. Your treatment has been planned according to current medical practices, but problems sometimes occur. Call your health care provider if you have any problems or questions after your procedure. WHAT TO EXPECT AFTER THE PROCEDURE After your procedure, it is typical to have soreness, bruising, and swelling of your breast. This is normal. You will be given medicines to control your pain. HOME CARE INSTRUCTIONS  Take medicines only as directed by your health care provider.  Resume a normal diet as directed by your health care provider.  Resume normal activity as directed by your health care provider. Avoid strenuous activity that affects the arm on the side that the surgical cut (incision) was made. Avoid playing tennis, swimming, lifting heavy objects (those that weigh more than 10 pounds [4.5 kg]), and pulling for 2 weeks.  Change bandages (dressings) as directed by your health care provider.  Consider wearing a bra to bed if you feel discomfort at the breast. Wearing a bra also helps keep dressings on.  Keep all follow-up visits as directed by your health care provider. This is important.  Call for the results of your procedure as instructed by your surgeon. It is your responsibility to get your test results. Do not assume everything is fine if you have not heard from your health care provider.  Keep the incision site dry.  If the incision site is tender, applying an ice pack may relieve some discomfort. To do this:  Put ice in a plastic bag.  Place a towel between your skin and the bag.  Leave the ice on for 15-20 minutes, 3-4 times a day. SEEK MEDICAL CARE IF:   You have increased bleeding from the incision site.  You notice redness, swelling, or  increasing pain in the incision.  You have pus coming from the incision site.  You have a fever.  You notice a foul smell coming from the incision or dressing. SEEK IMMEDIATE MEDICAL CARE IF:   You develop a rash.  You have shortness of breath.  You have chest pain.   This information is not intended to replace advice given to you by your health care provider. Make sure you discuss any questions you have with your health care provider.   Document Released: 05/04/2006 Document Revised: 05/09/2014 Document Reviewed: 11/15/2012 Elsevier Interactive Patient Education 2016 [place]   1) The drugs that you were given will stay in your system until tomorrow so for the next 24 hours you should not:  A) Drive an automobile B) Make any legal decisions C) Drink any alcoholic beverage   2) You may resume regular meals tomorrow.  Today it is better to start with liquids and gradually work up to solid foods.  You may eat anything you prefer, but it is better to start with liquids, then soup and crackers, and gradually work up to solid foods.   3) Please notify your doctor immediately if you have any unusual bleeding, trouble breathing, redness and pain at the surgery site, drainage, fever, or pain not relieved by medication.    4) Additional Instructions:        Please contact your physician with any problems or Same Day Surgery at [phone], Monday through Friday 6 am to 4 pm, or  [facility] number at [phone].

## 2015-11-17 NOTE — Brief Op Note (Signed)
11/17/2015  3:34 PM  PATIENT:  Stephanie Crosby  80 y.o. female  PRE-OPERATIVE DIAGNOSIS:  history of breast cancer  POST-OPERATIVE DIAGNOSIS:  history of breast cancer  PROCEDURE:  Procedure(s): BREAST BIOPSY WITH NEEDLE LOCALIZATION (Bilateral)  SURGEON:  Surgeon(s) and Role:    * [name], MD - Primary  PHYSICIAN ASSISTANT:   ASSISTANTS: none   ANESTHESIA:   general  EBL:  Total I/O In: 500 [I.V.:500] Out: -   BLOOD ADMINISTERED:none  DRAINS: none   LOCAL MEDICATIONS USED:  MARCAINE   , XYLOCAINE  and Amount: 30 ml  SPECIMEN:  Source of Specimen:  right and left breast mass  DISPOSITION OF SPECIMEN:  PATHOLOGY  COUNTS:  YES  TOURNIQUET:  * No tourniquets in log *  DICTATION: .Dragon Dictation  PLAN OF CARE: Discharge to home after PACU  PATIENT DISPOSITION:  PACU - hemodynamically stable.   Delay start of Pharmacological VTE agent (>24hrs) due to surgical blood loss or risk of bleeding: no

## 2015-11-17 NOTE — H&P (View-Only) (Signed)
Outpatient Surgical Follow Up  10/23/2015  Stephanie Crosby is an 80 y.o. female.   Chief Complaint  Patient presents with  . Follow-up    Bilateral Breast     HPI: 80 year old female who is well known to the surgery service returns to clinic today to discuss her surgical options. Patient is known to have bilateral breast cancer with a right-sided invasive ductal stage I and a left-sided invasive lobular stage II. Patient has completed 6 cycles of neoadjuvant chemotherapy and continues on Herceptin. She denies being able to palpate any masses on either side and returns to clinic today to discuss her recent MRI results. Patient states that her fatigue has improved dramatically after an infusion of iron last week. She denies any fevers, chills, chest pain, shortness of breath, diarrhea, constipation.  Past Medical History  Diagnosis Date  . Skin cancer 2011  . Back injury   . Hiatal hernia   . Anemia   . GERD (gastroesophageal reflux disease)   . Sleep apnea   . Breast cancer in situ 05/2015    Left  . Seasonal allergic rhinitis   . Gonalgia   . Breast cancer [facility])     Past Surgical History  Procedure Laterality Date  . Appendectomy    . Cataract extraction, bilateral Bilateral 2012  . Colonoscopy    . Portacath placement Right 06/10/2015    Procedure: INSERTION PORT-A-CATH;  Surgeon: [name], MD;  Location: [facility] ORS;  Service: General;  Laterality: Right;    Family History  Problem Relation Age of Onset  . Breast cancer Neg Hx   . Dementia Mother   . Hyperlipidemia Mother   . Hypertension Mother   . Congestive Heart Failure Mother   . Heart disease Mother   . Aneurysm Father     abdominal  . Fibromyalgia Sister   . Heart disease Sister   . Cancer Maternal Uncle     brain  . Heart disease Maternal Uncle   . Ovarian cancer Maternal Grandmother   . Cancer Cousin     breast cancer    Social History:  reports that she has never smoked. She has never used  smokeless tobacco. She reports that she drinks alcohol. She reports that she does not use illicit drugs.  Allergies:  Allergies  Allergen Reactions  . Penicillins Rash    Medications reviewed.    ROS  A multi-point review of systems was completed and all pertinent findings were documented in the history of present illness. The remainder are negative.  BP 131/80 mmHg  Pulse 94  Temp(Src) 97.7 F (36.5 C) (Oral)  Ht 5\' 7"  (1.702 m)  Wt 72.576 kg (160 lb)  BMI 25.05 kg/m2  Physical Exam  Gen.: No acute distress Neck: Supple and nontender Breast: Bilateral breast examined. No obvious palpable masses on either side. There is no evidence of palpable lymphadenopathy to either axilla. Chest: Clear to auscultation Heart: Tachycardic Abdomen: Soft and nontender   No results found for this or any previous visit (from the past 48 hour(s)). No results found. BILATERAL BREAST MRI WITH AND WITHOUT CONTRAST: 1. Response to therapy. No significant residual enhancement is seen within the left breast at site of known invasive lobular carcinoma. 2. No residual suspicious enhancement is seen at the site of prior biopsy within the upper, slightly outer right breast demonstrating invasive ductal carcinoma.  Assessment/Plan:  1. Personal history of breast cancer 81 year old female with previously biopsied bilateral breast cancer. Recent MRI  has shown a complete resolution of all cancer that is able to be noted on MRI. Discussed these results in detail with the patient and her husband. Discussed that in this case where it appears that it could be a complete pathologic response to chemo that an excisional biopsy is warranted to insure that this is actually a complete response. The procedure of a bilateral needle localized breast biopsy was discussed in detail and all questions were answered to her satisfaction. Plan for excisional biopsy for July 17th. - MM LT [facility] BREAST LOC DEV   1ST LESION   INC MAMMO GUIDE; Future - MM RT [facility] BREAST LOC DEV   EA ADD LESION  INC MAMMO GUIDE; Future     [name], MD [facility] General Surgeon  10/23/2015,1:20 PM

## 2015-11-17 NOTE — Transfer of Care (Signed)
Immediate Anesthesia Transfer of Care Note  Patient: Stephanie Crosby  Procedure(s) Performed: Procedure(s): BREAST BIOPSY WITH NEEDLE LOCALIZATION (Bilateral)  Patient Location: PACU  Anesthesia Type:General  Level of Consciousness: sedated and responds to stimulation  Airway & Oxygen Therapy: Patient Spontanous Breathing and Patient connected to face mask oxygen  Post-op Assessment: Report given to RN and Post -op Vital signs reviewed and stable  Post vital signs: Reviewed and stable  Last Vitals:  Filed Vitals:   11/17/15 1336 11/17/15 1539  BP: 159/93 108/61  Pulse: 88 79  Temp: 36.5 C 36.6 C  Resp: 18 12    Last Pain:  Filed Vitals:   11/17/15 1541  PainSc: Asleep         Complications: No apparent anesthesia complications

## 2015-11-17 NOTE — Anesthesia Postprocedure Evaluation (Signed)
Anesthesia Post Note  Patient: Stephanie Crosby  Procedure(s) Performed: Procedure(s) (LRB): BREAST BIOPSY WITH NEEDLE LOCALIZATION (Bilateral)  Patient location during evaluation: PACU Anesthesia Type: General Level of consciousness: awake and alert Pain management: pain level controlled Vital Signs Assessment: post-procedure vital signs reviewed and stable Respiratory status: spontaneous breathing, nonlabored ventilation, respiratory function stable and patient connected to nasal cannula oxygen Cardiovascular status: blood pressure returned to baseline and stable Postop Assessment: no signs of nausea or vomiting Anesthetic complications: no    Last Vitals:  Filed Vitals:   11/17/15 1630 11/17/15 1656  BP: 145/81 140/78  Pulse: 84 81  Temp: 36.1 C   Resp: 16 16    Last Pain:  Filed Vitals:   11/17/15 1657  PainSc: 0-No pain                 Precious Haws Piscitello

## 2015-11-17 NOTE — Anesthesia Preprocedure Evaluation (Addendum)
Anesthesia Evaluation  Patient identified by MRN, date of birth, ID band Patient awake    Reviewed: Allergy & Precautions, NPO status , Patient's Chart, lab work & pertinent test results  History of Anesthesia Complications Negative for: history of anesthetic complications  Airway Mallampati: II       Dental   Pulmonary neg pulmonary ROS, sleep apnea ,           Cardiovascular negative cardio ROS       Neuro/Psych negative neurological ROS     GI/Hepatic Neg liver ROS, hiatal hernia, GERD  Medicated and Controlled,  Endo/Other  negative endocrine ROS  Renal/GU negative Renal ROS     Musculoskeletal   Abdominal   Peds  Hematology  (+) anemia ,   Anesthesia Other Findings   Reproductive/Obstetrics                            Anesthesia Physical Anesthesia Plan  ASA: II  Anesthesia Plan: General   Post-op Pain Management:    Induction: Intravenous  Airway Management Planned: LMA  Additional Equipment:   Intra-op Plan:   Post-operative Plan:   Informed Consent: I have reviewed the patients History and Physical, chart, labs and discussed the procedure including the risks, benefits and alternatives for the proposed anesthesia with the patient or authorized representative who has indicated his/her understanding and acceptance.     Plan Discussed with:   Anesthesia Plan Comments:         Anesthesia Quick Evaluation

## 2015-11-17 NOTE — Interval H&P Note (Signed)
History and Physical Interval Note:  11/17/2015 1:52 PM  Stephanie Crosby  has presented today for surgery, with the diagnosis of history of breast cancer  The various methods of treatment have been discussed with the patient and family. After consideration of risks, benefits and other options for treatment, the patient has consented to  Procedure(s): BREAST BIOPSY WITH NEEDLE LOCALIZATION (Bilateral) as a surgical intervention .  The patient's history has been reviewed, patient examined, no change in status, stable for surgery.  I have reviewed the patient's chart and labs.  Questions were answered to the patient's satisfaction.     [name]

## 2015-11-17 NOTE — Pre-Procedure Instructions (Signed)
MEDICAL CLEARANCE RECEIVED FROM DR HEDRICKS OFFICE-PT IS LOW RISK-CALLED ANN IN [employer] AND INFORMED HER OF THIS AND ALSO FAXED CLEARANCE OVER TO [employer] WITH FAX CONFIRMATION RECEIVED-ALSO CALLED AMY AT DR [facility] OFFICE AND INFORMED HER PT HAS BEEN CLEARED AND FAXED CLEARANCE OVER TO THEIR OFFICE AS WELL-FAX CONFIRMATION RECEIVED

## 2015-11-17 NOTE — Pre-Procedure Instructions (Signed)
CALLED OVER TO DR HEDRICKS OFFICE TO SEE IF PT HAS BEEN CLEARED FOR HER SURGERY TODAY-RECEPTIONIST TALKED TO NURSE WHO SAID PT HAD HER APPT THIS AM BUT HAS NOT HEARD YET IF SHE IS CLEARED FOR SURGERY OR NOT-I TOLD HER IT IS VERY IMPORTANT THAT THEY LET ME KNOW BECAUSE SHE IS TO REPORT FOR HER SURGERY AT 12:30 TODAY- RECEPTIONIST SAID THAT SHE WILL GET NURSE TO CALL ME AS SOON AS SHE FINDS OUT

## 2015-11-18 ENCOUNTER — Ambulatory Visit: Payer: Medicare Other

## 2015-11-20 LAB — SURGICAL PATHOLOGY

## 2015-11-23 ENCOUNTER — Ambulatory Visit
Admission: RE | Admit: 2015-11-23 | Discharge: 2015-11-23 | Disposition: A | Discharge status: Home / Self Care | Payer: Medicare Other | Source: Ambulatory Visit | Attending: Oncology | Admitting: Oncology

## 2015-11-23 DIAGNOSIS — C50912 Malignant neoplasm of unspecified site of left female breast: Secondary | ICD-10-CM | POA: Insufficient documentation

## 2015-11-23 MED ORDER — TECHNETIUM TC 99M-LABELED RED BLOOD CELLS IV KIT
20.0000 | PACK | Freq: Once | INTRAVENOUS | Status: AC | PRN
  Administered 2015-11-23: 20.64 via INTRAVENOUS

## 2015-11-24 ENCOUNTER — Inpatient Hospital Stay: Payer: Medicare Other | Attending: Oncology | Admitting: Oncology

## 2015-11-24 ENCOUNTER — Inpatient Hospital Stay (HOSPITAL_BASED_OUTPATIENT_CLINIC_OR_DEPARTMENT_OTHER): Payer: Medicare Other

## 2015-11-24 ENCOUNTER — Inpatient Hospital Stay: Payer: Medicare Other

## 2015-11-24 VITALS — BP 148/94 | HR 85 | Temp 97.8°F | Wt 154.5 lb

## 2015-11-24 DIAGNOSIS — K219 Gastro-esophageal reflux disease without esophagitis: Secondary | ICD-10-CM | POA: Diagnosis not present

## 2015-11-24 DIAGNOSIS — R918 Other nonspecific abnormal finding of lung field: Secondary | ICD-10-CM | POA: Insufficient documentation

## 2015-11-24 DIAGNOSIS — Z85828 Personal history of other malignant neoplasm of skin: Secondary | ICD-10-CM | POA: Insufficient documentation

## 2015-11-24 DIAGNOSIS — C50912 Malignant neoplasm of unspecified site of left female breast: Secondary | ICD-10-CM | POA: Diagnosis not present

## 2015-11-24 DIAGNOSIS — Z923 Personal history of irradiation: Secondary | ICD-10-CM | POA: Insufficient documentation

## 2015-11-24 DIAGNOSIS — D649 Anemia, unspecified: Secondary | ICD-10-CM | POA: Diagnosis not present

## 2015-11-24 DIAGNOSIS — C50911 Malignant neoplasm of unspecified site of right female breast: Secondary | ICD-10-CM | POA: Insufficient documentation

## 2015-11-24 DIAGNOSIS — Z803 Family history of malignant neoplasm of breast: Secondary | ICD-10-CM | POA: Insufficient documentation

## 2015-11-24 DIAGNOSIS — Z79811 Long term (current) use of aromatase inhibitors: Secondary | ICD-10-CM | POA: Diagnosis not present

## 2015-11-24 DIAGNOSIS — Z5112 Encounter for antineoplastic immunotherapy: Secondary | ICD-10-CM | POA: Insufficient documentation

## 2015-11-24 DIAGNOSIS — C50812 Malignant neoplasm of overlapping sites of left female breast: Secondary | ICD-10-CM

## 2015-11-24 DIAGNOSIS — Z8041 Family history of malignant neoplasm of ovary: Secondary | ICD-10-CM | POA: Insufficient documentation

## 2015-11-24 DIAGNOSIS — C50411 Malignant neoplasm of upper-outer quadrant of right female breast: Secondary | ICD-10-CM

## 2015-11-24 DIAGNOSIS — Z79899 Other long term (current) drug therapy: Secondary | ICD-10-CM | POA: Insufficient documentation

## 2015-11-24 DIAGNOSIS — Z809 Family history of malignant neoplasm, unspecified: Secondary | ICD-10-CM | POA: Diagnosis not present

## 2015-11-24 DIAGNOSIS — K449 Diaphragmatic hernia without obstruction or gangrene: Secondary | ICD-10-CM | POA: Insufficient documentation

## 2015-11-24 DIAGNOSIS — Z170 Estrogen receptor positive status [ER+]: Secondary | ICD-10-CM | POA: Diagnosis not present

## 2015-11-24 DIAGNOSIS — R911 Solitary pulmonary nodule: Secondary | ICD-10-CM

## 2015-11-24 DIAGNOSIS — R600 Localized edema: Secondary | ICD-10-CM | POA: Diagnosis not present

## 2015-11-24 LAB — CBC WITH DIFFERENTIAL/PLATELET
BASOS ABS: 0 10*3/uL (ref 0–0.1)
BASOS PCT: 1 %
EOS ABS: 0.2 10*3/uL (ref 0–0.7)
Eosinophils Relative: 4 %
HCT: 41.2 % (ref 35.0–47.0)
HEMOGLOBIN: 13.8 g/dL (ref 12.0–16.0)
Lymphocytes Relative: 36 %
Lymphs Abs: 1.6 10*3/uL (ref 1.0–3.6)
MCH: 30.6 pg (ref 26.0–34.0)
MCHC: 33.6 g/dL (ref 32.0–36.0)
MCV: 91.2 fL (ref 80.0–100.0)
MONOS PCT: 10 %
Monocytes Absolute: 0.4 10*3/uL (ref 0.2–0.9)
NEUTROS PCT: 49 %
Neutro Abs: 2.1 10*3/uL (ref 1.4–6.5)
Platelets: 196 10*3/uL (ref 150–440)
RBC: 4.52 MIL/uL (ref 3.80–5.20)
RDW: 19.7 % — ABNORMAL HIGH (ref 11.5–14.5)
WBC: 4.3 10*3/uL (ref 3.6–11.0)

## 2015-11-24 LAB — COMPREHENSIVE METABOLIC PANEL
ALBUMIN: 3.7 g/dL (ref 3.5–5.0)
ALK PHOS: 60 U/L (ref 38–126)
ALT: 20 U/L (ref 14–54)
ANION GAP: 7 (ref 5–15)
AST: 23 U/L (ref 15–41)
BUN: 16 mg/dL (ref 6–20)
CALCIUM: 9.6 mg/dL (ref 8.9–10.3)
CO2: 25 mmol/L (ref 22–32)
Chloride: 107 mmol/L (ref 101–111)
Creatinine, Ser: 0.74 mg/dL (ref 0.44–1.00)
GFR calc Af Amer: >60 mL/min (ref >60)
GFR calc non Af Amer: >60 mL/min (ref >60)
GLUCOSE: 117 mg/dL — AB (ref 65–99)
POTASSIUM: 4.2 mmol/L (ref 3.5–5.1)
SODIUM: 139 mmol/L (ref 135–145)
Total Bilirubin: 0.9 mg/dL (ref 0.3–1.2)
Total Protein: 6.7 g/dL (ref 6.5–8.1)

## 2015-11-24 MED ORDER — TRASTUZUMAB CHEMO 150 MG IV SOLR
6.0000 mg/kg | Freq: Once | INTRAVENOUS | Status: AC
  Administered 2015-11-24: 462 mg via INTRAVENOUS
  Filled 2015-11-24: qty 22

## 2015-11-24 MED ORDER — ACETAMINOPHEN 325 MG PO TABS
650.0000 mg | ORAL_TABLET | Freq: Once | ORAL | Status: AC
  Administered 2015-11-24: 650 mg via ORAL
  Filled 2015-11-24: qty 2

## 2015-11-24 MED ORDER — SODIUM CHLORIDE 0.9% FLUSH
10.0000 mL | INTRAVENOUS | Status: DC | PRN
  Administered 2015-11-24: 10 mL
  Filled 2015-11-24: qty 10

## 2015-11-24 MED ORDER — DIPHENHYDRAMINE HCL 25 MG PO CAPS
25.0000 mg | ORAL_CAPSULE | Freq: Once | ORAL | Status: AC
  Administered 2015-11-24: 25 mg via ORAL
  Filled 2015-11-24: qty 1

## 2015-11-24 MED ORDER — SODIUM CHLORIDE 0.9 % IV SOLN
Freq: Once | INTRAVENOUS | Status: AC
  Administered 2015-11-24: 15:00:00 via INTRAVENOUS
  Filled 2015-11-24: qty 1000

## 2015-11-24 MED ORDER — HEPARIN SOD (PORK) LOCK FLUSH 100 UNIT/ML IV SOLN
500.0000 [IU] | Freq: Once | INTRAVENOUS | Status: AC | PRN
  Administered 2015-11-24: 500 [IU]
  Filled 2015-11-24: qty 5

## 2015-11-25 ENCOUNTER — Encounter: Payer: Self-pay | Admitting: General Surgery

## 2015-11-25 ENCOUNTER — Ambulatory Visit (INDEPENDENT_AMBULATORY_CARE_PROVIDER_SITE_OTHER): Payer: Medicare Other | Admitting: General Surgery

## 2015-11-25 VITALS — BP 139/88 | HR 81 | Temp 98.2°F | Ht 67.0 in | Wt 152.8 lb

## 2015-11-25 DIAGNOSIS — C50911 Malignant neoplasm of unspecified site of right female breast: Secondary | ICD-10-CM

## 2015-11-25 DIAGNOSIS — C50912 Malignant neoplasm of unspecified site of left female breast: Secondary | ICD-10-CM

## 2015-11-25 NOTE — Progress Notes (Signed)
Outpatient Surgical Follow Up  11/25/2015  Stephanie Crosby is an 80 y.o. female.   Chief Complaint  Patient presents with  . Routine Post Op    Bilateral Breast Biopsies (Dr. Adonis Huguenin)    HPI: 80 year old female returns to clinic 1 week s/p bilateral needle localized breast biopsies. She has completed neo-adjuvant chemo and had a complete radiographic response. She is here today for a wound check and pathology review. She reports no pain, only itching. She has been eating well, having normal bowel function and denies any fevers or chills. She was informed of her pathology by her oncologist yesterday and knows that her right breast was free of any disease but her left breast did have invasive lobular carcinoma still in the breast. She is questioning what we do next.  Past Medical History:  Diagnosis Date  . Anemia   . Back injury   . Breast cancer ([place])   . Breast cancer in situ 05/2015   Left  . Edema of leg   . GERD (gastroesophageal reflux disease)   . Gonalgia   . Hiatal hernia   . Seasonal allergic rhinitis   . Skin cancer 2011  . Sleep apnea     Past Surgical History:  Procedure Laterality Date  . APPENDECTOMY    . BREAST BIOPSY Bilateral 11/17/2015   Procedure: BREAST BIOPSY WITH NEEDLE LOCALIZATION;  Surgeon: [name], MD;  Location: [facility] ORS;  Service: General;  Laterality: Bilateral;  . CATARACT EXTRACTION, BILATERAL Bilateral 2012  . COLONOSCOPY    . PORTACATH PLACEMENT Right 06/10/2015   Procedure: INSERTION PORT-A-CATH;  Surgeon: [name], MD;  Location: [facility] ORS;  Service: General;  Laterality: Right;    Family History  Problem Relation Age of Onset  . Dementia Mother   . Hyperlipidemia Mother   . Hypertension Mother   . Congestive Heart Failure Mother   . Heart disease Mother   . Aneurysm Father     abdominal  . Fibromyalgia Sister   . Heart disease Sister   . Cancer Maternal Uncle     brain  . Heart disease Maternal Uncle   . Ovarian  cancer Maternal Grandmother   . Cancer Cousin     breast cancer  . Breast cancer Neg Hx     Social History:  reports that she has never smoked. She has never used smokeless tobacco. She reports that she drinks alcohol. She reports that she does not use drugs.  Allergies:  Allergies  Allergen Reactions  . Penicillins Rash    Has patient had a PCN reaction causing immediate rash, facial/tongue/throat swelling, SOB or lightheadedness with hypotension: unsure Has patient had a PCN reaction causing severe rash involving mucus membranes or skin necrosis: no Has patient had a PCN reaction that required hospitalization no Has patient had a PCN reaction occurring within the last 10 years: no If all of the above answers are "NO", then may proceed with Cephalosporin use.    Medications reviewed.    ROS A multi-point review of systems was completed, all pertinent positives and negatives are documented in the HPI and the remainder are negative.   BP 139/88   Pulse 81   Temp 98.2 F (36.8 C) (Oral)   Ht '5\' 7"'$  (1.702 m)   Wt 69.3 kg (152 lb 12.8 oz)   BMI 23.93 kg/m   Physical Exam  Gen: No acute distress Neck: Supple and non-tender Lymph nodes: No palpable cervical, clavicular or axillary lymphadenopathy Breast: Bilateral  breasts examined, well approximated incision sites with glue in place. No evidence of erythema or drainage. No palpable masses to either breast Chest: CTA Heart: RRR Abd: Soft, Nontender   Results for orders placed or performed in visit on 11/24/15 (from the past 48 hour(s))  CBC with Differential     Status: Abnormal   Collection Time: 11/24/15  1:34 PM  Result Value Ref Range   WBC 4.3 3.6 - 11.0 K/uL   RBC 4.52 3.80 - 5.20 MIL/uL   Hemoglobin 13.8 12.0 - 16.0 g/dL   HCT 41.2 35.0 - 47.0 %   MCV 91.2 80.0 - 100.0 fL   MCH 30.6 26.0 - 34.0 pg   MCHC 33.6 32.0 - 36.0 g/dL   RDW 19.7 (H) 11.5 - 14.5 %   Platelets 196 150 - 440 K/uL   Neutrophils Relative  % 49 %   Neutro Abs 2.1 1.4 - 6.5 K/uL   Lymphocytes Relative 36 %   Lymphs Abs 1.6 1.0 - 3.6 K/uL   Monocytes Relative 10 %   Monocytes Absolute 0.4 0.2 - 0.9 K/uL   Eosinophils Relative 4 %   Eosinophils Absolute 0.2 0 - 0.7 K/uL   Basophils Relative 1 %   Basophils Absolute 0.0 0 - 0.1 K/uL  Comprehensive metabolic panel     Status: Abnormal   Collection Time: 11/24/15  1:34 PM  Result Value Ref Range   Sodium 139 135 - 145 mmol/L   Potassium 4.2 3.5 - 5.1 mmol/L   Chloride 107 101 - 111 mmol/L   CO2 25 22 - 32 mmol/L   Glucose, Bld 117 (H) 65 - 99 mg/dL   BUN 16 6 - 20 mg/dL   Creatinine, Ser 0.74 0.44 - 1.00 mg/dL   Calcium 9.6 8.9 - 10.3 mg/dL   Total Protein 6.7 6.5 - 8.1 g/dL   Albumin 3.7 3.5 - 5.0 g/dL   AST 23 15 - 41 U/L   ALT 20 14 - 54 U/L   Alkaline Phosphatase 60 38 - 126 U/L   Total Bilirubin 0.9 0.3 - 1.2 mg/dL   GFR calc non Af Amer >60 >60 mL/min   GFR calc Af Amer >60 >60 mL/min    Comment: (NOTE) The eGFR has been calculated using the CKD EPI equation. This calculation has not been validated in all clinical situations. eGFR's persistently <60 mL/min signify possible Chronic Kidney Disease.    Anion gap 7 5 - 15   No results found.  Assessment/Plan:  1. Bilateral malignant neoplasm of breast in female, unspecified site of breast [facility]) 80 year old female with a history of biopsy proven bilateral breast cancers of different types. Discussed pathology in detail with the patient. Left breast with 1.7 cm invasive lobular and right breast with no identified residual tumor but was previously biopsy proven invasive ductal. Discussed that the next step is to perform a sentinel lymph node biopsy of bilateral sides, however there is no indication to excise any additional breast tissue as the margins from the excision were clear. The procedure was described in detail to include the risks, benefits and alternatives. She voiced understanding and wishes to proceed.  Plan for sentinel lymph node biopsies next week.     [name], MD FACS General Surgeon  11/25/2015,12:54 PM

## 2015-11-25 NOTE — Patient Instructions (Addendum)
We will arrange your Sentinel Node Biopsy (remove the first lymph nodes that empty the breasts) on Wednesday, 12/02/15.   Angie (our surgery scheduler) will call you with details of your arrival. Expect to hear from her later today or tomorrow.  Call with any questions or concerns that you may have.   Sentinel Lymph Node Biopsy Sentinel lymph node biopsy is a procedure to identify, remove, and examine one or more lymph nodes for cancer. Lymph nodes are collections of tissue that help filter infections, cancer cells, and other waste substances from the bloodstream. Certain types of cancer can spread to nearby lymph nodes. The cancer usually spreads to one lymph node first, and then to others. The first lymph node that the cancer could spread to is called the sentinel lymph node. In some cases, there may be more than one sentinel lymph node. You may have this procedure to determine whether your cancer has spread and to help your health care provider plan treatment for you. If no cancer is found in the sentinel lymph node, it is very unlikely that the cancer has spread to any of the other lymph nodes. If cancer is found in the sentinel lymph node, your surgeon may remove additional lymph nodes for examination. LET [facility] CARE PROVIDER KNOW ABOUT:   Any allergies you have, including any history of problems with contrast dye.  All medicines you are taking, including vitamins, herbs, eye drops, creams, and over-the-counter medicines.  Previous problems you or members of your family have had with the use of anesthetics.  Any blood disorders you have.  Previous surgeries you have had.  Medical conditions you have. RISKS AND COMPLICATIONS  Generally, sentinel lymph node biopsy is a safe procedure. However, problems can occur and include:  Infection.  Bleeding.  Allergic reaction to the dye used for the procedure.  Staining of the skin where the dye is injected.  Damaged lymph vessels,  causing a buildup of fluid (lymphedema).  Pain or bruising at the biopsy site. BEFORE THE PROCEDURE   If you smoke, stop smoking at least 2 weeks before the procedure. This will improve your health after the procedure and reduce your risk of getting an infection.  You may have blood tests to make sure your blood clots normally.  Ask your health care provider about:  Changing or stopping your regular medicines. This is especially important if you are taking diabetes medicines or blood thinners.  Taking medicines such as aspirin and ibuprofen. These medicines can thin your blood. Do not take these medicines before your procedure if your health care provider asks you not to.  Do not eat or drink anything after midnight on the night before the procedure or as directed by your health care provider.  Plan to have someone take you home after the procedure. PROCEDURE   You will be given a medicine that makes you fall asleep (general anesthetic) or medicine that numbs the surgical area (local anesthetic).  Blue dye or a radioactive substance or both will be injected around the tumor.  The blue dye will reach your lymph node quickly. It may be given just before surgery.  The radioactive substance will take longer to reach your lymph nodes, so it may be given before you go into the operating area.  Both the dye and the radioactive substance will follow the same path that a spreading cancer would be likely to follow.  If a radioactive substance is injected, a scanner will show where the  substance has spread to help identify the sentinel lymph node.  The surgeon will make a small incision. If blue dye is injected, your surgeon will look for any lymph nodes that have picked up the dye.  Sentinel lymph nodes will be removed and sent to a lab for examination.  If no cancer is found, no other lymph nodes will be removed. This means it is unlikely the cancer has spread to other lymph nodes.  If  cancer is found, the surgeon will remove other lymph nodes for examination. This may happen during the same procedure or at a later time.  The incision will be closed with stitches or metal clips.  Small adhesive bandages may be used to keep the skin edges close together.  A small dressing may be taped over the incision area. AFTER THE PROCEDURE   You will go to a recovery room.  Your urine may be blue for the next 24 hours. This is normal. It is caused by the dye used during the procedure.  Your skin at the injection site may be blue for up to 8 weeks.  You may feel numbness, tingling, or pain near your incision.  You may have swelling or bruising near your incision.   This information is not intended to replace advice given to you by your health care provider. Make sure you discuss any questions you have with your health care provider.   Document Released: 07/11/2011 Document Revised: 05/09/2014 Document Reviewed: 06/07/2013 Elsevier Interactive Patient Education [employer].

## 2015-11-26 ENCOUNTER — Other Ambulatory Visit: Payer: Self-pay

## 2015-11-26 ENCOUNTER — Telehealth: Payer: Self-pay | Admitting: General Surgery

## 2015-11-26 MED ORDER — ACETAMINOPHEN 10 MG/ML IV SOLN
INTRAVENOUS | Status: AC
Start: 2015-11-26 — End: 2015-11-26
  Filled 2015-11-26: qty 100

## 2015-11-26 NOTE — Telephone Encounter (Signed)
Pt advised of pre op date/time and sx date. Sx: 12/02/15 with Dr Nathanial Rancher Sentinel Node injection and axillary bx--Bilateral.  Pre op: 11/27/15 between 1-5pm--Phone.   Patient was advised to arrive at 10:45 at [facility] the day of surgery. She will be registered and sent to radiology for the sentinel node injection then to [facility] for her procedure.

## 2015-11-27 ENCOUNTER — Encounter
Admission: RE | Admit: 2015-11-27 | Discharge: 2015-11-27 | Disposition: A | Discharge status: Home / Self Care | Payer: Medicare Other | Source: Ambulatory Visit | Attending: General Surgery | Admitting: General Surgery

## 2015-11-27 HISTORY — DX: Unspecified epiphora, bilateral: H04.203

## 2015-11-27 NOTE — Patient Instructions (Signed)
  Your procedure is scheduled on: 12-02-15 Report to [place] To find out your arrival time please call [phone] between 1PM - 3PM on 12-01-15  Remember: Instructions that are not followed completely may result in serious medical risk, up to and including death, or upon the discretion of your surgeon and anesthesiologist your surgery may need to be rescheduled.    _x___ 1. Do not eat food or drink liquids after midnight. No gum chewing or hard candies.     __x__ 2. No Alcohol for 24 hours before or after surgery.   __x__3. No Smoking for 24 prior to surgery.   ____  4. Bring all medications with you on the day of surgery if instructed.    __x__ 5. Notify your doctor if there is any change in your medical condition     (cold, fever, infections).     Do not wear jewelry, make-up, hairpins, clips or nail polish.  Do not wear lotions, powders, or perfumes. You may wear deodorant.  Do not shave 48 hours prior to surgery. Men may shave face and neck.  Do not bring valuables to the hospital.    [facility] is not responsible for any belongings or valuables.               Contacts, dentures or bridgework may not be worn into surgery.  Leave your suitcase in the car. After surgery it may be brought to your room.  For patients admitted to the hospital, discharge time is determined by your treatment team.   Patients discharged the day of surgery will not be allowed to drive home.    Please read over the following fact sheets that you were given:   [facility] Preparing for Surgery and or MRSA Information   _x___ Take these medicines the morning of surgery with A SIP OF WATER:    1. PRILOSEC  2. TAKE AN EXTRA PRILOSEC ON Tuesday NIGHT BEFORE BED  3.  4.  5.  6.  ____ Fleet Enema (as directed)   ____ Use CHG Soap or sage wipes as directed on instruction sheet   ____ Use inhalers on the day of surgery and bring to hospital day of surgery  ____ Stop  metformin 2 days prior to surgery    ____ Take 1/2 of usual insulin dose the night before surgery and none on the morning of           surgery.   ____ Stop aspirin or coumadin, or plavix  _x__ Stop Anti-inflammatories such as Advil, Aleve, Ibuprofen, Motrin, Naproxen,          Naprosyn, Goodies powders or aspirin products. Ok to take Tylenol.   ____ Stop supplements until after surgery.    ____ Bring C-Pap to the hospital.

## 2015-11-28 NOTE — Progress Notes (Addendum)
_ [facility]  [phone] [phone]  ID: [name] OB: 07-20-1935  MR#: [id number]  CZY#:[id number]  [name], MD as PCP - General (Family Medicine) [name], MD as Consulting Physician (General Surgery)  CHIEF COMPLAINT:   1. Clinical stage IIb ER positive, PR negative, HER-2 overexpressing invasive lobular carcinoma overlapping sites of the left breast, now pathologic stage Ia (T1c,N0,M0). 2. Clinical stage Ia ER/PR positive, HER-2 negative invasive ductal carcinoma of the upper outer quadrant of right breast, now pathologic stage 0.  INTERVAL HISTORY: Patient returns to clinic today for further evaluation, discussion of her final pathology results, and consideration of cycle 8 of Herceptin only. She currently feels well and is asymptomatic. She does not complain of thickened watery eye drainage today. She has no neurologic complaints. She denies any fevers. She denies any pain. She has no chest pain or shortness of breath. She denies any nausea, vomiting, constipation or diarrhea. She has no urinary complaints. Patient offers no further specific complaints.  REVIEW OF SYSTEMS:   Review of Systems  Constitutional: Negative for fever, malaise/fatigue and weight loss.  HENT: Negative for sore throat.   Eyes: Negative for discharge and redness.  Respiratory: Negative.  Negative for shortness of breath.   Cardiovascular: Negative.  Negative for chest pain and leg swelling.  Gastrointestinal: Negative.  Negative for blood in stool, diarrhea and melena.  Musculoskeletal: Negative.   Skin: Negative.   Neurological: Negative.  Negative for weakness.  Psychiatric/Behavioral: Negative.  The patient is not nervous/anxious.     As per HPI. Otherwise, a complete review of systems is negatve.  PAST MEDICAL HISTORY: Past Medical History:  Diagnosis Date  . Anemia   . Back injury   . Breast cancer ([place])   . Breast  cancer in situ 05/2015   Left  . Edema of leg   . GERD (gastroesophageal reflux disease)   . Gonalgia   . Hiatal hernia   . Seasonal allergic rhinitis   . Skin cancer 2011  . Sleep apnea   . Tearing eyes    SINCE TAKING CHEMO    PAST SURGICAL HISTORY: Past Surgical History:  Procedure Laterality Date  . APPENDECTOMY    . BREAST BIOPSY Bilateral 11/17/2015   Procedure: BREAST BIOPSY WITH NEEDLE LOCALIZATION;  Surgeon: [name], MD;  Location: [facility] ORS;  Service: General;  Laterality: Bilateral;  . CATARACT EXTRACTION, BILATERAL Bilateral 2012  . COLONOSCOPY    . PORTACATH PLACEMENT Right 06/10/2015   Procedure: INSERTION PORT-A-CATH;  Surgeon: [name], MD;  Location: [facility] ORS;  Service: General;  Laterality: Right;    FAMILY HISTORY: Patient reports a maternal aunt with breast cancer as well as a female cousin with breast cancer.  Family History  Problem Relation Age of Onset  . Dementia Mother   . Hyperlipidemia Mother   . Hypertension Mother   . Congestive Heart Failure Mother   . Heart disease Mother   . Aneurysm Father     abdominal  . Fibromyalgia Sister   . Heart disease Sister   . Cancer Maternal Uncle     brain  . Heart disease Maternal Uncle   . Ovarian cancer Maternal Grandmother   . Cancer Cousin     breast cancer  . Breast cancer Neg Hx        ADVANCED DIRECTIVES:    HEALTH MAINTENANCE: Social History  Substance Use Topics  . Smoking status: Never Smoker  . Smokeless tobacco:  Never Used  . Alcohol use 0.0 oz/week     Comment: 3 glasses wine/week      Allergies  Allergen Reactions  . Penicillins Rash    Has patient had a PCN reaction causing immediate rash, facial/tongue/throat swelling, SOB or lightheadedness with hypotension: unsure Has patient had a PCN reaction causing severe rash involving mucus membranes or skin necrosis: no Has patient had a PCN reaction that required hospitalization no Has patient had a PCN reaction  occurring within the last 10 years: no If all of the above answers are "NO", then may proceed with Cephalosporin use.    Current Outpatient Prescriptions  Medication Sig Dispense Refill  . acetaminophen (TYLENOL) 500 MG tablet Take 500 mg by mouth every 6 (six) hours as needed for mild pain.    Marland Kitchen lidocaine-prilocaine (EMLA) cream Apply 1 application topically as needed (for chemo therapy port access).     Marland Kitchen loratadine (CLARITIN) 10 MG tablet Take 10 mg by mouth every other day. ALTERNATES WITH BENADRYL    . Multiple Vitamins-Minerals (CENTRUM SILVER PO) Take 1 tablet by mouth daily. Reported on 06/15/2015    . omeprazole (PRILOSEC) 20 MG capsule Take 1 capsule by mouth every morning. Reported on 06/15/2015    . traMADol (ULTRAM) 50 MG tablet Take 1 tablet (50 mg total) by mouth every 8 (eight) hours as needed. (Patient taking differently: Take 50 mg by mouth every 8 (eight) hours as needed for moderate pain. ) 20 tablet 0  . valACYclovir (VALTREX) 500 MG tablet Take 500 mg by mouth daily as needed (break outs). Reported on 06/15/2015    . diphenhydrAMINE (SOMINEX) 25 MG tablet Take 25 mg by mouth every other day. ALTERNATES WITH CLARITIN     No current facility-administered medications for this visit.    Facility-Administered Medications Ordered in Other Visits  Medication Dose Route Frequency Provider Last Rate Last Dose  . sodium chloride flush (NS) 0.9 % injection 10 mL  10 mL Intracatheter PRN Lloyd Huger, MD        OBJECTIVE: Vitals:   11/24/15 1421  BP: (!) 148/94  Pulse: 85  Temp: 97.8 F (36.6 C)     Body mass index is 24.2 kg/m.    ECOG FS:0 - Asymptomatic  General: Well-developed, well-nourished, no acute distress. Eyes: Pink conjunctiva, anicteric sclera. Breasts: Exam deferred today. Lungs: Clear to auscultation bilaterally. Heart: Regular rate and rhythm. No rubs, murmurs, or gallops. Abdomen: Soft, nontender, nondistended. No organomegaly noted, normoactive  bowel sounds. Musculoskeletal: No edema, cyanosis, or clubbing. Neuro: Alert, answering all questions appropriately. Cranial nerves grossly intact. Skin: ecchymosis right breast. Psych: Normal affect.   LAB RESULTS:  Lab Results  Component Value Date   NA 139 11/24/2015   K 4.2 11/24/2015   CL 107 11/24/2015   CO2 25 11/24/2015   GLUCOSE 117 (H) 11/24/2015   BUN 16 11/24/2015   CREATININE 0.74 11/24/2015   CALCIUM 9.6 11/24/2015   PROT 6.7 11/24/2015   ALBUMIN 3.7 11/24/2015   AST 23 11/24/2015   ALT 20 11/24/2015   ALKPHOS 60 11/24/2015   BILITOT 0.9 11/24/2015   GFRNONAA >60 11/24/2015   GFRAA >60 11/24/2015    Lab Results  Component Value Date   WBC 4.3 11/24/2015   NEUTROABS 2.1 11/24/2015   HGB 13.8 11/24/2015   HCT 41.2 11/24/2015   MCV 91.2 11/24/2015   PLT 196 11/24/2015   Lab Results  Component Value Date   IRON 10 (L) 09/15/2015  TIBC 395 09/15/2015   IRONPCTSAT 3 (L) 09/15/2015   Lab Results  Component Value Date   FERRITIN 14 09/15/2015     STUDIES: Chest 2 View  Result Date: 11/10/2015 CLINICAL DATA:  Pre op; pt having surgery next week to remove titanium markers and some tissue from both breasts; history of breast cancer; EXAM: CHEST  2 VIEW COMPARISON:  06/10/2015 and 09/23/2014. FINDINGS: Cardiac silhouette is top-normal in size. Moderate to large hiatal hernia is stable. No mediastinal or hilar masses or convincing adenopathy. There are prominent vascular markings similar to the prior studies. Increased opacities noted in the lung bases is likely atelectasis. Small pleural effusions are evident. No convincing pneumonia. No pneumothorax. Right anterior chest wall Port-A-Cath is well positioned with its tip at the caval atrial junction. IMPRESSION: 1. Prominent vascular markings and small effusions. No overt pulmonary edema or convincing pneumonia. Mild lung base atelectasis. 2. Moderate to large hiatal hernia. Electronically Signed   By: Lajean Manes M.D.   On: 11/10/2015 09:38   Nm Cardiac Muga Rest  Result Date: 11/23/2015 CLINICAL DATA:  Breast cancer. Evaluate cardiac function in relation to chemotherapy. EXAM: NUCLEAR MEDICINE CARDIAC BLOOD POOL IMAGING (MUGA) TECHNIQUE: Cardiac multi-gated acquisition was performed at rest following intravenous injection of Tc-57mlabeled red blood cells. RADIOPHARMACEUTICALS:  20.6 mCi Tc-952mDP in-vitro labeled red blood cells IV COMPARISON:  08/14/2015, MUGA scan FINDINGS: No  focal wall motion abnormality of the left ventricle. Calculated left ventricular ejection fraction equals 63%. Previously EF equal 65% IMPRESSION: LEFT ventricular ejection fraction equals 63%. No significant change. Electronically Signed   By: StSuzy Bouchard.D.   On: 11/23/2015 10:58  Mm Breast Surgical Specimen  Result Date: 11/17/2015 CLINICAL DATA:  Status post right lumpectomy after earlier needle localization. EXAM: SPECIMEN RADIOGRAPH OF THE RIGHT BREAST COMPARISON:  Previous exam(s). FINDINGS: Status post excision of the right breast. The wire tip and biopsy marker clip are present and are marked for pathology. IMPRESSION: Specimen radiograph of the right breast. Electronically Signed   By: StFranki Cabot.D.   On: 11/17/2015 15:24   Mm Breast Surgical Specimen  Result Date: 11/17/2015 CLINICAL DATA:  Status post left lumpectomy after earlier needle localization. EXAM: SPECIMEN RADIOGRAPH OF THE LEFT BREAST COMPARISON:  Previous exam(s). FINDINGS: Status post excision of the left breast. The wire tip and biopsy marker clip are present and are marked for pathology. IMPRESSION: Specimen radiograph of the left breast. Electronically Signed   By: StFranki Cabot.D.   On: 11/17/2015 15:10   Mm Lt Plc Breast Loc Dev   1st Lesion  Inc Mammo Guide  Result Date: 11/17/2015 CLINICAL DATA:  Patient with bilateral breast cancers presents today for bilateral lumpectomies requiring of bilateral needle localizations. EXAM:  NEEDLE LOCALIZATION OF THE BILATERAL BREAST WITH MAMMO GUIDANCE COMPARISON:  Previous exams. FINDINGS: Patient presents for needle localization prior to bilateral lumpectomies. I met with the patient and we discussed the procedure of needle localization including benefits and alternatives. We discussed the high likelihood of a successful procedure. We discussed the risks of the procedure, including infection, bleeding, tissue injury, and further surgery. Informed, written consent was given. The usual time-out protocol was performed immediately prior to the procedure. Using mammographic guidance, sterile technique, 1% lidocaine and a 5 cm modified Kopans needle, the barbell shaped clip in the right breast was localized using superior approach. Next, using mammographic guidance, sterile technique, 1% lidocaine and a 7 cm modified Kopan's needle, the wing shaped  clip in the left breast was localized using a superior approach. The images were marked for Dr. Adonis Huguenin. IMPRESSION: Needle localization bilateral breast. No apparent complications. Electronically Signed   By: Franki Cabot M.D.   On: 11/17/2015 13:25   Mm Rt Plc Breast Loc Dev   1st Lesion  Inc Mammo Guide  Result Date: 11/17/2015 CLINICAL DATA:  Patient with bilateral breast cancers presents today for bilateral lumpectomies requiring of bilateral needle localizations. EXAM: NEEDLE LOCALIZATION OF THE BILATERAL BREAST WITH MAMMO GUIDANCE COMPARISON:  Previous exams. FINDINGS: Patient presents for needle localization prior to bilateral lumpectomies. I met with the patient and we discussed the procedure of needle localization including benefits and alternatives. We discussed the high likelihood of a successful procedure. We discussed the risks of the procedure, including infection, bleeding, tissue injury, and further surgery. Informed, written consent was given. The usual time-out protocol was performed immediately prior to the procedure. Using mammographic  guidance, sterile technique, 1% lidocaine and a 5 cm modified Kopans needle, the barbell shaped clip in the right breast was localized using superior approach. Next, using mammographic guidance, sterile technique, 1% lidocaine and a 7 cm modified Kopan's needle, the wing shaped clip in the left breast was localized using a superior approach. The images were marked for Dr. Adonis Huguenin. IMPRESSION: Needle localization bilateral breast. No apparent complications. Electronically Signed   By: Franki Cabot M.D.   On: 11/17/2015 13:25    ASSESSMENT:   1. Clinical stage IIb ER positive, PR negative, HER-2 overexpressing invasive lobular carcinoma overlapping sites of the left breast, now pathologic stage Ia (T1c,N0,M0). 2. Clinical stage Ia ER/PR positive, HER-2 negative invasive ductal carcinoma of the upper outer quadrant of right breast, now pathologic stage 0. 3. BRCA1 and 2 negative.  PLAN:    1. Clinical stage IIb ER positive, PR negative, HER-2 overexpressing invasive lobular carcinoma overlapping sites of the left breast, now pathologic stage Ia (T1c,N0,M0): Patient noted to have residual stage IA tumor and her left breast with a complete pathologic response in her right breast. Patient has now completed 6 cycles of chemotherapy, but will continue with adjuvant Herceptin only. Proceed with cycle 8 of 18 today. MUGA scans from November 23, 2015 reported an EF of 63%.  She will also require an aromatase inhibitor after completion of adjuvant XRT. Return to clinic in 3 weeks for consideration of cycle 9 of Herceptin only. 2. Clinical stage Ia ER/PR positive, HER-2 negative invasive ductal carcinoma of the upper outer quadrant of right breast, now pathologic stage 0: Complete pathologic response. Patient will require XRT  and an aromatase inhibitor for 5 years as above. 3. Pulmonary nodule: CT scan of the chest, abdomen, and pelvis revealed a 5 mm pulmonary nodule too small to characterize as well as a lesion in  her L4 vertebrae that appears to be benign. Will repeat CT of chest in one year.   4. Anemia: Hemoglobin, improved. Patient received IV iron in June 2017. Monitor. 5. Eye drainage: Possible allergies. Continue OTC medications as directed.  6. Acid Reflux: Continue omeprazole. 7. Lower extremity edema:  MUGA scan as above.  Patient expressed understanding and was in agreement with this plan. She also understands that She can call clinic at any time with any questions, concerns, or complaints.    Lloyd Huger, MD   11/28/2015 7:47 AM

## 2015-12-01 NOTE — Pre-Procedure Instructions (Signed)
Lendon Colonel, PA - 11/17/2015 9:30 AM EDT Formatting of this note may be different from the original. Stephanie Crosby is a 80 y.o. female who is here for surgery clearance.  She is having bilateral excisional breast biopsies done today, but needs surgical clearance prior to the surgery. She thinks she needs a chest x-ray repeated because a recent x-ray indicated small effusions present and surgeons would like this repeated prior to surgery. She denies worsening shortness of breath, chest pain, nausea or vomiting. She does have some shortness of breath when she is walking up and down stairs, but notes that this is her baseline and is normal for her. She notices that when she gets to the top of the stairs, she has to stop for a minute to catch her breath, but then she is fine. She does have eye drainage as a side effect from the chemotherapy. She also has bilateral ankle edema that has been present for the past 3 weeks - she didn't initially have this with the chemo medication, but thinks it might be a side effect. The swelling gets worse as the day goes on. She tries to elevate her legs when she is resting. She currently takes Herceptin and iron infusions for chronically low hemoglobin.  Patient Active Problem List  Diagnosis  . Seasonal allergies  . Benign breast lumps  . Sleep apnea  . Iron deficiency anemia  . Left knee pain   Past Medical History:  Diagnosis Date  . Benign breast lumps  . Chickenpox  . Iron deficiency anemia  Hemoccult positive  . Left knee pain  Some type of left knee problem, no surgery required within the last several months. Sees Dr. Sabra Heck in [facility] and physical therapy.  . Seasonal allergies  . Sleep apnea   Social History   Social History  . Marital status: Married  Spouse name: N/A  . Number of children: N/A  . Years of education: N/A   Occupational History  . Not on file.   Social History Main Topics  . Smoking status: Never Smoker  .  Smokeless tobacco: Never Used  . Alcohol use 0.0 oz/week  0 Standard drinks or equivalent per week  . Drug use: No  . Sexual activity: Not on file   Other Topics Concern  . Not on file   Social History Narrative   Current Outpatient Prescriptions:  . calcium 500 mg Tab, Take by mouth., Disp: , Rfl:  . FOLIC ACID/MULTIVIT-MIN/LUTEIN (CENTRUM SILVER ORAL), Take by mouth., Disp: , Rfl:  . omeprazole (PRILOSEC) 20 MG DR capsule, TAKE 1 CAPSULE EVERY DAY, Disp: 90 capsule, Rfl: 0 . omeprazole (PRILOSEC) 20 MG DR capsule, TAKE 1 CAPSULE EVERY DAY, Disp: 30 capsule, Rfl: 11 . valACYclovir (VALTREX) 500 MG tablet, Take 500 mg by mouth 2 (two) times daily., Disp: , Rfl:  . alendronate (FOSAMAX) 70 MG tablet, Take 1 tablet (70 mg total) by mouth every 7 (seven) days. Take with a full glass of water. Do not lie down for the next 30 min. (Patient not taking: Reported on 11/17/2015 ), Disp: 4 tablet, Rfl: 11 . omeprazole (PRILOSEC) 20 MG DR capsule, TAKE 1 CAPSULE EVERY DAY (Patient not taking: Reported on 04/21/2015), Disp: 30 capsule, Rfl: 6  Allergies  Allergen Reactions  . Penicillins Rash   Results for orders placed or performed in visit on 05/06/15  EXTERNAL RADIOLOGY -ULTRASOUND  Narrative  Ordered by an unspecified provider.   Review of Systems:  Negative for: fever, chills,  nausea, vomiting, shortness of breath. Positive for: bilateral ankle edema  Physical Exam:  Vitals:  11/17/15 0929  BP: 126/82  Pulse: 94  SpO2: 95%  Weight: 73 kg (161 lb)  Height: 167.6 cm (5\' 6" )   CHEST X-RAY: No significant or acute findings. No appreciable pleural effusions. Will send for over-read.  General: Patient is well-groomed, well-nourished, appears stated age, in no acute distress.  HEENT: Head is atraumatic and normocephalic. Neck is supple with no palpable nodules, no cervical lymphadenopathy. Ears canals are clear bilaterally, TM's without abnormality. Eyes - conjunctiva are clear  bilaterally without injection, PERRL. Throat is non-erythematous, no exudate.   CV: Regular rhythm and normal heart rate, no murmur, no JVD.  Lungs: Clear to auscultation throughout bilateral lung fields with no wheezing, crackles, or rhonchi. No increased work of breathing.  Extremities: bilateral 1+ pitting edema to ankles. Pedal pulses intact bilaterally.   Assessment/Plan:  Pre-operative clearance: Patient required repeat chest x-ray prior to excisional biopsy scheduled for today. CXR in office was unremarkable - discussed with Dr. Kary Kos, but will send for over-read. Patient is not having worsening shortness of breath from baseline or any chest pain. No indication for EKG at this time as she just had one within the past couple of months that was normal per patient. From our standpoint, she is cleared for surgery, per Dr. Kary Kos.  Ankle swelling: Discussed with patient that she can elevate her legs and wear compression stockings to help with the swelling.   Wayland Denis, PA-C     Plan of Treatment - as of this encounter  Upcoming Encounters Upcoming Encounters  Date Type Specialty Care Team Description  03/18/2016 Ancillary Orders Lab Lovie Macadamia, MD  [address] [place], [place] [address]  [phone]  [phone] (Fax)    03/30/2016 Office Visit Internal Medicine Lovie Macadamia, MD  [address] [place], [place] [address]  [phone]  [phone] (Fax)

## 2015-12-02 ENCOUNTER — Ambulatory Visit: Payer: Medicare Other

## 2015-12-02 ENCOUNTER — Telehealth: Payer: Self-pay | Admitting: General Surgery

## 2015-12-02 MED ORDER — CHLORHEXIDINE GLUCONATE CLOTH 2 % EX PADS: 6.0000 | MEDICATED_PAD | Freq: Once | CUTANEOUS | Status: DC

## 2015-12-02 MED ORDER — CLINDAMYCIN PHOSPHATE 600 MG/50ML IV SOLN: 600.0000 mg | INTRAVENOUS | Status: AC

## 2015-12-02 MED ORDER — LIDOCAINE-EPINEPHRINE (PF) 1 %-1:200000 IJ SOLN
INTRAMUSCULAR | Status: AC
  Filled 2015-12-02: qty 30

## 2015-12-02 MED ORDER — BUPIVACAINE HCL (PF) 0.5 % IJ SOLN
INTRAMUSCULAR | Status: AC
  Filled 2015-12-02: qty 30

## 2015-12-02 MED ORDER — LACTATED RINGERS IV SOLN
INTRAVENOUS | Status: DC
  Administered 2015-12-10: 12:00:00 via INTRAVENOUS

## 2015-12-02 NOTE — Telephone Encounter (Signed)
Patient has called and cancelled surgery for 12/02/15 with Dr Nathanial Rancher SN injection with Bx. Patient states that she is sick with diarrhea. I have contacted the OR and Radiology to advise of the cancellation. Dr Adonis Huguenin has been advised as well.   Once I'm able to discuss another surgery date with Dr. Adonis Huguenin, I will contact the patient to reschedule.

## 2015-12-04 NOTE — Telephone Encounter (Signed)
Pt advised of pre op date/time and sx date. Sx: 12/10/15--Bilateral SN injection/biopsy.   Pre op: no pre op--previously had.   Patient made aware to arrive at 11:15am at Registration the day of surgery at [place] [facility] [place].

## 2015-12-09 ENCOUNTER — Ambulatory Visit
Admission: RE | Admit: 2015-12-09 | Discharge: 2015-12-09 | Disposition: A | Discharge status: Home / Self Care | Payer: Medicare Other | Source: Ambulatory Visit | Attending: Oncology | Admitting: Oncology

## 2015-12-09 DIAGNOSIS — I2510 Atherosclerotic heart disease of native coronary artery without angina pectoris: Secondary | ICD-10-CM | POA: Diagnosis not present

## 2015-12-09 DIAGNOSIS — K449 Diaphragmatic hernia without obstruction or gangrene: Secondary | ICD-10-CM | POA: Insufficient documentation

## 2015-12-09 DIAGNOSIS — R911 Solitary pulmonary nodule: Secondary | ICD-10-CM | POA: Diagnosis present

## 2015-12-09 DIAGNOSIS — R918 Other nonspecific abnormal finding of lung field: Secondary | ICD-10-CM | POA: Diagnosis not present

## 2015-12-09 DIAGNOSIS — I700 Atherosclerosis of aorta: Secondary | ICD-10-CM | POA: Insufficient documentation

## 2015-12-09 HISTORY — DX: Malignant neoplasm of unspecified site of right female breast: C50.911

## 2015-12-09 HISTORY — DX: Malignant neoplasm of unspecified site of left female breast: C50.912

## 2015-12-09 HISTORY — DX: Squamous cell carcinoma of skin, unspecified: C44.92

## 2015-12-09 MED ORDER — IOPAMIDOL (ISOVUE-300) INJECTION 61%
75.0000 mL | Freq: Once | INTRAVENOUS | Status: AC | PRN
  Administered 2015-12-09: 75 mL via INTRAVENOUS

## 2015-12-10 ENCOUNTER — Ambulatory Visit: Payer: Medicare Other | Admitting: Anesthesiology

## 2015-12-10 ENCOUNTER — Encounter
Admission: RE | Disposition: A | Discharge status: Home / Self Care | Payer: Self-pay | Source: Ambulatory Visit | Attending: General Surgery

## 2015-12-10 ENCOUNTER — Encounter: Payer: Self-pay | Admitting: *Deleted

## 2015-12-10 ENCOUNTER — Ambulatory Visit
Admission: RE | Admit: 2015-12-10 | Discharge: 2015-12-10 | Disposition: A | Discharge status: Home / Self Care | Payer: Medicare Other | Source: Ambulatory Visit | Attending: General Surgery | Admitting: General Surgery

## 2015-12-10 DIAGNOSIS — C50912 Malignant neoplasm of unspecified site of left female breast: Principal | ICD-10-CM

## 2015-12-10 DIAGNOSIS — Z85828 Personal history of other malignant neoplasm of skin: Secondary | ICD-10-CM | POA: Diagnosis not present

## 2015-12-10 DIAGNOSIS — K449 Diaphragmatic hernia without obstruction or gangrene: Secondary | ICD-10-CM | POA: Diagnosis not present

## 2015-12-10 DIAGNOSIS — Z803 Family history of malignant neoplasm of breast: Secondary | ICD-10-CM | POA: Insufficient documentation

## 2015-12-10 DIAGNOSIS — Z8059 Family history of malignant neoplasm of other urinary tract organ: Secondary | ICD-10-CM | POA: Diagnosis not present

## 2015-12-10 DIAGNOSIS — Z853 Personal history of malignant neoplasm of breast: Secondary | ICD-10-CM | POA: Diagnosis not present

## 2015-12-10 DIAGNOSIS — Z8249 Family history of ischemic heart disease and other diseases of the circulatory system: Secondary | ICD-10-CM | POA: Diagnosis not present

## 2015-12-10 DIAGNOSIS — K219 Gastro-esophageal reflux disease without esophagitis: Secondary | ICD-10-CM | POA: Diagnosis not present

## 2015-12-10 DIAGNOSIS — Z880 Allergy status to penicillin: Secondary | ICD-10-CM | POA: Diagnosis not present

## 2015-12-10 DIAGNOSIS — Z8041 Family history of malignant neoplasm of ovary: Secondary | ICD-10-CM | POA: Insufficient documentation

## 2015-12-10 DIAGNOSIS — D649 Anemia, unspecified: Secondary | ICD-10-CM | POA: Insufficient documentation

## 2015-12-10 DIAGNOSIS — G4730 Sleep apnea, unspecified: Secondary | ICD-10-CM | POA: Diagnosis not present

## 2015-12-10 DIAGNOSIS — Z9841 Cataract extraction status, right eye: Secondary | ICD-10-CM | POA: Insufficient documentation

## 2015-12-10 DIAGNOSIS — C50911 Malignant neoplasm of unspecified site of right female breast: Secondary | ICD-10-CM

## 2015-12-10 DIAGNOSIS — Z9221 Personal history of antineoplastic chemotherapy: Secondary | ICD-10-CM | POA: Insufficient documentation

## 2015-12-10 DIAGNOSIS — Z9842 Cataract extraction status, left eye: Secondary | ICD-10-CM | POA: Diagnosis not present

## 2015-12-10 HISTORY — PX: AXILLARY SENTINEL NODE BIOPSY: SHX5738

## 2015-12-10 SURGERY — BIOPSY, LYMPH NODE, SENTINEL, AXILLARY
Anesthesia: General | Laterality: Bilateral | Wound class: Clean

## 2015-12-10 MED ORDER — TECHNETIUM TC 99M SULFUR COLLOID
0.9130 | Freq: Once | INTRAVENOUS | Status: AC | PRN
  Administered 2015-12-10: 0.913 via INTRAVENOUS

## 2015-12-10 MED ORDER — CLINDAMYCIN PHOSPHATE 600 MG/50ML IV SOLN
INTRAVENOUS | Status: AC
  Filled 2015-12-10: qty 50

## 2015-12-10 MED ORDER — MIDAZOLAM HCL 2 MG/2ML IJ SOLN
INTRAMUSCULAR | Status: DC | PRN
  Administered 2015-12-10: 1 mg via INTRAVENOUS

## 2015-12-10 MED ORDER — TECHNETIUM TC 99M SULFUR COLLOID
1.0000 | Freq: Once | INTRAVENOUS | Status: AC | PRN
  Administered 2015-12-10: 0.949 via INTRAVENOUS

## 2015-12-10 MED ORDER — LIDOCAINE-EPINEPHRINE (PF) 1 %-1:200000 IJ SOLN
INTRAMUSCULAR | Status: AC
  Filled 2015-12-10: qty 30

## 2015-12-10 MED ORDER — TECHNETIUM TC 99M SULFUR COLLOID: 1.0000 | Freq: Once | INTRAVENOUS | Status: DC | PRN

## 2015-12-10 MED ORDER — ONDANSETRON HCL 4 MG/2ML IJ SOLN: 4.0000 mg | Freq: Once | INTRAMUSCULAR | Status: DC | PRN

## 2015-12-10 MED ORDER — METHYLENE BLUE 0.5 % INJ SOLN
INTRAVENOUS | Status: AC
  Filled 2015-12-10: qty 10

## 2015-12-10 MED ORDER — FENTANYL CITRATE (PF) 100 MCG/2ML IJ SOLN
25.0000 ug | INTRAMUSCULAR | Status: DC | PRN
  Administered 2015-12-10 (×5): 25 ug via INTRAVENOUS

## 2015-12-10 MED ORDER — BUPIVACAINE HCL (PF) 0.5 % IJ SOLN
INTRAMUSCULAR | Status: DC | PRN
  Administered 2015-12-10: 8 mL
  Administered 2015-12-10: 9 mL

## 2015-12-10 MED ORDER — DEXAMETHASONE SODIUM PHOSPHATE 10 MG/ML IJ SOLN
INTRAMUSCULAR | Status: DC | PRN
  Administered 2015-12-10: 5 mg via INTRAVENOUS

## 2015-12-10 MED ORDER — SODIUM CHLORIDE 0.9 % IJ SOLN
INTRAMUSCULAR | Status: AC
  Filled 2015-12-10: qty 50

## 2015-12-10 MED ORDER — FENTANYL CITRATE (PF) 100 MCG/2ML IJ SOLN
INTRAMUSCULAR | Status: DC | PRN
  Administered 2015-12-10: 50 ug via INTRAVENOUS
  Administered 2015-12-10: 25 ug via INTRAVENOUS

## 2015-12-10 MED ORDER — ACETAMINOPHEN 10 MG/ML IV SOLN
INTRAVENOUS | Status: AC
  Filled 2015-12-10: qty 100

## 2015-12-10 MED ORDER — LIDOCAINE-EPINEPHRINE (PF) 1 %-1:200000 IJ SOLN
INTRAMUSCULAR | Status: DC | PRN
  Administered 2015-12-10: 9 mL
  Administered 2015-12-10: 8 mL

## 2015-12-10 MED ORDER — LIDOCAINE HCL (CARDIAC) 20 MG/ML IV SOLN
INTRAVENOUS | Status: DC | PRN
  Administered 2015-12-10: 50 mg via INTRAVENOUS

## 2015-12-10 MED ORDER — METHYLENE BLUE 0.5 % INJ SOLN
INTRAVENOUS | Status: DC | PRN
  Administered 2015-12-10: 8 mL

## 2015-12-10 MED ORDER — ONDANSETRON HCL 4 MG/2ML IJ SOLN
INTRAMUSCULAR | Status: DC | PRN
  Administered 2015-12-10: 4 mg via INTRAVENOUS

## 2015-12-10 MED ORDER — TRAMADOL HCL 50 MG PO TABS
50.0000 mg | ORAL_TABLET | Freq: Once | ORAL | Status: AC
  Administered 2015-12-10: 50 mg via ORAL
  Filled 2015-12-10: qty 1

## 2015-12-10 MED ORDER — PHENYLEPHRINE HCL 10 MG/ML IJ SOLN
INTRAMUSCULAR | Status: DC | PRN
  Administered 2015-12-10 (×3): 50 ug via INTRAVENOUS

## 2015-12-10 MED ORDER — ACETAMINOPHEN 10 MG/ML IV SOLN
INTRAVENOUS | Status: DC | PRN
  Administered 2015-12-10: 1000 mg via INTRAVENOUS

## 2015-12-10 MED ORDER — PROPOFOL 10 MG/ML IV BOLUS
INTRAVENOUS | Status: DC | PRN
Start: 2015-12-10 — End: 2015-12-10
  Administered 2015-12-10: 130 mg via INTRAVENOUS

## 2015-12-10 MED ORDER — EPHEDRINE SULFATE 50 MG/ML IJ SOLN
INTRAMUSCULAR | Status: DC | PRN
  Administered 2015-12-10 (×3): 5 mg via INTRAVENOUS

## 2015-12-10 MED ORDER — TRAMADOL HCL 50 MG PO TABS
ORAL_TABLET | ORAL | Status: AC
  Filled 2015-12-10: qty 1

## 2015-12-10 MED ORDER — CLINDAMYCIN PHOSPHATE 600 MG/50ML IV SOLN
INTRAVENOUS | Status: DC | PRN
  Administered 2015-12-10: 600 mg via INTRAVENOUS

## 2015-12-10 MED ORDER — BUPIVACAINE HCL (PF) 0.5 % IJ SOLN
INTRAMUSCULAR | Status: AC
  Filled 2015-12-10: qty 30

## 2015-12-10 MED ORDER — FENTANYL CITRATE (PF) 100 MCG/2ML IJ SOLN
INTRAMUSCULAR | Status: AC
  Administered 2015-12-10: 25 ug via INTRAVENOUS
  Filled 2015-12-10: qty 2

## 2015-12-10 MED ORDER — FENTANYL CITRATE (PF) 100 MCG/2ML IJ SOLN
INTRAMUSCULAR | Status: AC
  Filled 2015-12-10: qty 2

## 2015-12-10 SURGICAL SUPPLY — 33 items
BLADE SURG 15 STRL LF DISP TIS (BLADE) ×2 IMPLANT
BLADE SURG 15 STRL SS (BLADE) ×4
CANISTER SUCT 1200ML W/VALVE (MISCELLANEOUS) ×3 IMPLANT
CHLORAPREP W/TINT 26ML (MISCELLANEOUS) ×3 IMPLANT
CNTNR SPEC 2.5X3XGRAD LEK (MISCELLANEOUS) ×4
CONT SPEC 4OZ STER OR WHT (MISCELLANEOUS) ×8
CONTAINER SPEC 2.5X3XGRAD LEK (MISCELLANEOUS) ×4 IMPLANT
COVER PROBE FLX POLY STRL (MISCELLANEOUS) ×3 IMPLANT
DRAPE LAPAROTOMY TRNSV 106X77 (MISCELLANEOUS) ×3 IMPLANT
ELECT CAUTERY BLADE 6.4 (BLADE) ×3 IMPLANT
ELECT REM PT RETURN 9FT ADLT (ELECTROSURGICAL) ×3
ELECTRODE REM PT RTRN 9FT ADLT (ELECTROSURGICAL) ×1 IMPLANT
GLOVE BIO SURGEON STRL SZ7 (GLOVE) ×3 IMPLANT
GLOVE BIO SURGEON STRL SZ7.5 (GLOVE) ×3 IMPLANT
GLOVE INDICATOR 8.0 STRL GRN (GLOVE) ×3 IMPLANT
GOWN STRL REUS W/ TWL LRG LVL3 (GOWN DISPOSABLE) ×1 IMPLANT
GOWN STRL REUS W/TWL LRG LVL3 (GOWN DISPOSABLE) ×2
KIT RM TURNOVER STRD PROC AR (KITS) ×3 IMPLANT
LABEL OR SOLS (LABEL) ×3 IMPLANT
LIQUID BAND (GAUZE/BANDAGES/DRESSINGS) ×3 IMPLANT
NEEDLE HYPO 25X1 1.5 SAFETY (NEEDLE) ×9 IMPLANT
PACK BASIN MINOR ARMC (MISCELLANEOUS) ×3 IMPLANT
SLEVE PROBE SENORX GAMMA FIND (MISCELLANEOUS) ×3 IMPLANT
SUT MNCRL 4-0 (SUTURE) ×4
SUT MNCRL 4-0 27XMFL (SUTURE) ×2
SUT SILK 2 0 SH (SUTURE) ×3 IMPLANT
SUT VIC AB 2-0 CT2 27 (SUTURE) ×3 IMPLANT
SUT VIC AB 3-0 SH 27 (SUTURE) ×2
SUT VIC AB 3-0 SH 27X BRD (SUTURE) ×1 IMPLANT
SUTURE MNCRL 4-0 27XMF (SUTURE) ×2 IMPLANT
SYR BULB EAR ULCER 3OZ GRN STR (SYRINGE) ×3 IMPLANT
SYRINGE 10CC LL (SYRINGE) ×6 IMPLANT
WATER STERILE IRR 1000ML POUR (IV SOLUTION) ×3 IMPLANT

## 2015-12-10 NOTE — Interval H&P Note (Signed)
History and Physical Interval Note:  12/10/2015 12:10 PM  Stephanie Crosby  has presented today for surgery, with the diagnosis of BILATERAL MALIGNANT NEOPLASM OF BREAST  The various methods of treatment have been discussed with the patient and family. After consideration of risks, benefits and other options for treatment, the patient has consented to  Procedure(s): BILATERAL SENTINEL NODE BIOPSY, SENTINNEL NODE INJ. (Bilateral) as a surgical intervention .  The patient's history has been reviewed, patient examined, no change in status, stable for surgery.  I have reviewed the patient's chart and labs.  Questions were answered to the patient's satisfaction.     [name]

## 2015-12-10 NOTE — Anesthesia Procedure Notes (Signed)
Procedure Name: LMA Insertion Date/Time: 12/10/2015 1:32 PM Performed by: Johnna Acosta Pre-anesthesia Checklist: Patient identified, Emergency Drugs available, Suction available, Patient being monitored and Timeout performed Patient Re-evaluated:Patient Re-evaluated prior to inductionOxygen Delivery Method: Circle system utilized Preoxygenation: Pre-oxygenation with 100% oxygen Intubation Type: IV induction LMA: LMA inserted LMA Size: 3.5 Tube type: Oral Number of attempts: 1 Tube secured with: Tape Dental Injury: Teeth and Oropharynx as per pre-operative assessment

## 2015-12-10 NOTE — Transfer of Care (Signed)
Immediate Anesthesia Transfer of Care Note  Patient: Stephanie Crosby  Procedure(s) Performed: Procedure(s): BILATERAL SENTINEL NODE BIOPSY, SENTINNEL NODE INJ. (Bilateral)  Patient Location: PACU  Anesthesia Type:General  Level of Consciousness: sedated  Airway & Oxygen Therapy: Patient Spontanous Breathing and Patient connected to face mask oxygen  Post-op Assessment: Report given to RN and Post -op Vital signs reviewed and stable  Post vital signs: Reviewed and stable  Last Vitals:  Vitals:   12/10/15 1129 12/10/15 1511  BP: (!) 145/89 (!) 154/80  Pulse: 89 80  Resp: 16 12  Temp: 36.9 C 36.2 C    Last Pain:  Vitals:   12/10/15 1129  TempSrc: Oral         Complications: No apparent anesthesia complications

## 2015-12-10 NOTE — Anesthesia Postprocedure Evaluation (Signed)
Anesthesia Post Note  Patient: Stephanie Crosby  Procedure(s) Performed: Procedure(s) (LRB): BILATERAL SENTINEL NODE BIOPSY, SENTINNEL NODE INJ. (Bilateral)  Patient location during evaluation: PACU Anesthesia Type: General Level of consciousness: awake and alert and oriented Pain management: pain level controlled Vital Signs Assessment: post-procedure vital signs reviewed and stable Respiratory status: spontaneous breathing, nonlabored ventilation and respiratory function stable Cardiovascular status: blood pressure returned to baseline and stable Postop Assessment: no signs of nausea or vomiting Anesthetic complications: no    Last Vitals:  Vitals:   12/10/15 1511 12/10/15 1526  BP: (!) 154/80 (!) 147/80  Pulse: 80 84  Resp: 12 (!) 8  Temp: 36.2 C     Last Pain:  Vitals:   12/10/15 1129  TempSrc: Oral                 Maximum Reiland

## 2015-12-10 NOTE — Brief Op Note (Signed)
12/10/2015  3:02 PM  PATIENT:  Stephanie Crosby  80 y.o. female  PRE-OPERATIVE DIAGNOSIS:  BILATERAL MALIGNANT NEOPLASM OF BREAST  POST-OPERATIVE DIAGNOSIS:  BILATERAL MALIGNANT NEOPLASM OF BREAST  PROCEDURE:  Procedure(s): BILATERAL SENTINEL NODE BIOPSY, SENTINNEL NODE INJ. (Bilateral)  SURGEON:  Surgeon(s) and Role:    * [name], MD - Primary  PHYSICIAN ASSISTANT:   ASSISTANTS: none   ANESTHESIA:   general  EBL:  Total I/O In: 900 [I.V.:900] Out: 10 [Blood:10]  BLOOD ADMINISTERED:none  DRAINS: none   LOCAL MEDICATIONS USED:  MARCAINE   , XYLOCAINE  and Amount: 17 ml  SPECIMEN:  Source of Specimen:  Left and right sentinel lymph nodes and axillary tissue  DISPOSITION OF SPECIMEN:  PATHOLOGY  COUNTS:  YES  TOURNIQUET:  * No tourniquets in log *  DICTATION: .Dragon Dictation  PLAN OF CARE: Discharge to home after PACU  PATIENT DISPOSITION:  PACU - hemodynamically stable.   Delay start of Pharmacological VTE agent (>24hrs) due to surgical blood loss or risk of bleeding: not applicable

## 2015-12-10 NOTE — Anesthesia Preprocedure Evaluation (Signed)
Anesthesia Evaluation  Patient identified by MRN, date of birth, ID band Patient awake    Reviewed: Allergy & Precautions, NPO status   Airway Mallampati: II       Dental  (+) Teeth Intact   Pulmonary sleep apnea ,    breath sounds clear to auscultation       Cardiovascular Exercise Tolerance: Good  Rhythm:Regular     Neuro/Psych    GI/Hepatic Neg liver ROS, hiatal hernia, GERD  Medicated,  Endo/Other  negative endocrine ROS  Renal/GU negative Renal ROS     Musculoskeletal   Abdominal Normal abdominal exam  (+)   Peds negative pediatric ROS (+)  Hematology  (+) anemia ,   Anesthesia Other Findings   Reproductive/Obstetrics                             Anesthesia Physical Anesthesia Plan  ASA: II  Anesthesia Plan: General   Post-op Pain Management:    Induction: Intravenous  Airway Management Planned: LMA  Additional Equipment:   Intra-op Plan:   Post-operative Plan: Extubation in OR  Informed Consent: I have reviewed the patients History and Physical, chart, labs and discussed the procedure including the risks, benefits and alternatives for the proposed anesthesia with the patient or authorized representative who has indicated his/her understanding and acceptance.     Plan Discussed with: CRNA  Anesthesia Plan Comments:         Anesthesia Quick Evaluation

## 2015-12-10 NOTE — Discharge Instructions (Signed)
Sentinel Lymph Node Biopsy in Breast Cancer Treatment, Care After Refer to this sheet in the next few weeks. These instructions provide you with information on caring for yourself after your procedure. Your health care provider may also give you more specific instructions. Your treatment has been planned according to current medical practices, but problems sometimes occur. Call your health care provider if you have any problems or questions after your procedure.  WHAT TO EXPECT AFTER THE PROCEDURE After your procedure, it is typical to have the following:  Your urine may be blue for the next 24 hours. This is normal. It is caused by the dye used during the procedure.  Your skin at the injection site may be blue for up to 8 weeks.  You may feel numbness, tingling, or pain near your surgical cut (incision).  You may have swelling or bruising near your incision. HOME CARE INSTRUCTIONS  Avoid vigorous exercise. Ask your health care provider when you can return to your normal activities.  You may shower 24 hours after your procedure. It is okay to get your incision wet. Gently pat the incision dry after you shower.  If you are given a surgical bra, wear it for the next 48 hours. You may remove the bra to shower.  Do not remove skin adhesive strips over your incision. They will fall off on their own over time.  Take medicines only as directed by your health care provider.  You may resume your regular diet.  Do not have your blood pressure taken on the side you had the biopsy until your health care provider says it is okay.  Keep all follow-up visits as directed by your health care provider. SEEK MEDICAL CARE IF:  Your pain medicine is not helping.  You have nausea and vomiting.  You have any new swelling, bruising, or redness.  You have chills or fever. SEEK IMMEDIATE MEDICAL CARE IF:  You have pain that is getting worse, and your medicine is not helping.  You have redness,  swelling, or tenderness that is getting worse.  You have bleeding or drainage from your incision site.  You have vomiting that will not stop.  You have chest pain or trouble breathing.   This information is not intended to replace advice given to you by your health care provider. Make sure you discuss any questions you have with your health care provider.   Document Released: 04/23/2013 Document Revised: 05/09/2014 Document Reviewed: 04/23/2013 Elsevier Interactive Patient Education 2016 [place]   1) The drugs that you were given will stay in your system until tomorrow so for the next 24 hours you should not:  A) Drive an automobile B) Make any legal decisions C) Drink any alcoholic beverage   2) You may resume regular meals tomorrow.  Today it is better to start with liquids and gradually work up to solid foods.  You may eat anything you prefer, but it is better to start with liquids, then soup and crackers, and gradually work up to solid foods.   3) Please notify your doctor immediately if you have any unusual bleeding, trouble breathing, redness and pain at the surgery site, drainage, fever, or pain not relieved by medication.    4) Additional Instructions:        Please contact your physician with any problems or Same Day Surgery at [phone], Monday through Friday 6 am to 4 pm, or [place] at [facility] number at [phone].

## 2015-12-10 NOTE — H&P (View-Only) (Signed)
Outpatient Surgical Follow Up  11/25/2015  Stephanie Crosby is an 80 y.o. female.   Chief Complaint  Patient presents with  . Routine Post Op    Bilateral Breast Biopsies (Dr. Tonita Cong)    HPI: 80 year old female returns to clinic 1 week s/p bilateral needle localized breast biopsies. She has completed neo-adjuvant chemo and had a complete radiographic response. She is here today for a wound check and pathology review. She reports no pain, only itching. She has been eating well, having normal bowel function and denies any fevers or chills. She was informed of her pathology by her oncologist yesterday and knows that her right breast was free of any disease but her left breast did have invasive lobular carcinoma still in the breast. She is questioning what we do next.  Past Medical History:  Diagnosis Date  . Anemia   . Back injury   . Breast cancer (HCC)   . Breast cancer in situ 05/2015   Left  . Edema of leg   . GERD (gastroesophageal reflux disease)   . Gonalgia   . Hiatal hernia   . Seasonal allergic rhinitis   . Skin cancer 2011  . Sleep apnea     Past Surgical History:  Procedure Laterality Date  . APPENDECTOMY    . BREAST BIOPSY Bilateral 11/17/2015   Procedure: BREAST BIOPSY WITH NEEDLE LOCALIZATION;  Surgeon: Ricarda Frame, MD;  Location: [facility] ORS;  Service: General;  Laterality: Bilateral;  . CATARACT EXTRACTION, BILATERAL Bilateral 2012  . COLONOSCOPY    . PORTACATH PLACEMENT Right 06/10/2015   Procedure: INSERTION PORT-A-CATH;  Surgeon: Ricarda Frame, MD;  Location: [facility] ORS;  Service: General;  Laterality: Right;    Family History  Problem Relation Age of Onset  . Dementia Mother   . Hyperlipidemia Mother   . Hypertension Mother   . Congestive Heart Failure Mother   . Heart disease Mother   . Aneurysm Father     abdominal  . Fibromyalgia Sister   . Heart disease Sister   . Cancer Maternal Uncle     brain  . Heart disease Maternal Uncle   . Ovarian  cancer Maternal Grandmother   . Cancer Cousin     breast cancer  . Breast cancer Neg Hx     Social History:  reports that she has never smoked. She has never used smokeless tobacco. She reports that she drinks alcohol. She reports that she does not use drugs.  Allergies:  Allergies  Allergen Reactions  . Penicillins Rash    Has patient had a PCN reaction causing immediate rash, facial/tongue/throat swelling, SOB or lightheadedness with hypotension: unsure Has patient had a PCN reaction causing severe rash involving mucus membranes or skin necrosis: no Has patient had a PCN reaction that required hospitalization no Has patient had a PCN reaction occurring within the last 10 years: no If all of the above answers are "NO", then may proceed with Cephalosporin use.    Medications reviewed.    ROS A multi-point review of systems was completed, all pertinent positives and negatives are documented in the HPI and the remainder are negative.   BP 139/88   Pulse 81   Temp 98.2 F (36.8 C) (Oral)   Ht 5\' 7"  (1.702 m)   Wt 69.3 kg (152 lb 12.8 oz)   BMI 23.93 kg/m   Physical Exam  Gen: No acute distress Neck: Supple and non-tender Lymph nodes: No palpable cervical, clavicular or axillary lymphadenopathy Breast: Bilateral  breasts examined, well approximated incision sites with glue in place. No evidence of erythema or drainage. No palpable masses to either breast Chest: CTA Heart: RRR Abd: Soft, Nontender   Results for orders placed or performed in visit on 11/24/15 (from the past 48 hour(s))  CBC with Differential     Status: Abnormal   Collection Time: 11/24/15  1:34 PM  Result Value Ref Range   WBC 4.3 3.6 - 11.0 K/uL   RBC 4.52 3.80 - 5.20 MIL/uL   Hemoglobin 13.8 12.0 - 16.0 g/dL   HCT 41.2 35.0 - 47.0 %   MCV 91.2 80.0 - 100.0 fL   MCH 30.6 26.0 - 34.0 pg   MCHC 33.6 32.0 - 36.0 g/dL   RDW 19.7 (H) 11.5 - 14.5 %   Platelets 196 150 - 440 K/uL   Neutrophils Relative  % 49 %   Neutro Abs 2.1 1.4 - 6.5 K/uL   Lymphocytes Relative 36 %   Lymphs Abs 1.6 1.0 - 3.6 K/uL   Monocytes Relative 10 %   Monocytes Absolute 0.4 0.2 - 0.9 K/uL   Eosinophils Relative 4 %   Eosinophils Absolute 0.2 0 - 0.7 K/uL   Basophils Relative 1 %   Basophils Absolute 0.0 0 - 0.1 K/uL  Comprehensive metabolic panel     Status: Abnormal   Collection Time: 11/24/15  1:34 PM  Result Value Ref Range   Sodium 139 135 - 145 mmol/L   Potassium 4.2 3.5 - 5.1 mmol/L   Chloride 107 101 - 111 mmol/L   CO2 25 22 - 32 mmol/L   Glucose, Bld 117 (H) 65 - 99 mg/dL   BUN 16 6 - 20 mg/dL   Creatinine, Ser 0.74 0.44 - 1.00 mg/dL   Calcium 9.6 8.9 - 10.3 mg/dL   Total Protein 6.7 6.5 - 8.1 g/dL   Albumin 3.7 3.5 - 5.0 g/dL   AST 23 15 - 41 U/L   ALT 20 14 - 54 U/L   Alkaline Phosphatase 60 38 - 126 U/L   Total Bilirubin 0.9 0.3 - 1.2 mg/dL   GFR calc non Af Amer >60 >60 mL/min   GFR calc Af Amer >60 >60 mL/min    Comment: (NOTE) The eGFR has been calculated using the CKD EPI equation. This calculation has not been validated in all clinical situations. eGFR's persistently <60 mL/min signify possible Chronic Kidney Disease.    Anion gap 7 5 - 15   No results found.  Assessment/Plan:  1. Bilateral malignant neoplasm of breast in female, unspecified site of breast [facility]) 80 year old female with a history of biopsy proven bilateral breast cancers of different types. Discussed pathology in detail with the patient. Left breast with 1.7 cm invasive lobular and right breast with no identified residual tumor but was previously biopsy proven invasive ductal. Discussed that the next step is to perform a sentinel lymph node biopsy of bilateral sides, however there is no indication to excise any additional breast tissue as the margins from the excision were clear. The procedure was described in detail to include the risks, benefits and alternatives. She voiced understanding and wishes to proceed.  Plan for sentinel lymph node biopsies next week.     [name], MD FACS General Surgeon  11/25/2015,12:54 PM

## 2015-12-10 NOTE — Op Note (Signed)
Pre-operative Diagnosis: History of breast cancer  Post-operative Diagnosis: Same  Surgeon: [name]   Assistants: none  Anesthesia: General LMA anesthesia  ASA Class: 3  Surgeon: [name], MD FACS  Anesthesia: Gen. with endotracheal tube  Assistant: None  Procedure Details  The patient was seen again in the Holding Room. The benefits, complications, treatment options, and expected outcomes were discussed with the patient. The risks of bleeding, infection, recurrence of symptoms, failure to fully remove cancer of present, any of which could require further surgery were reviewed with the patient.   The patient was taken to Operating Room, identified as Stephanie Crosby and the procedure verified.  A Time Out was held and the above information confirmed. Bilateral breast were instilled with 4 mL of isosulfan blue with 1 mm and each of the 4 quadrants around the nipple areolar complex.  Prior to the induction of general anesthesia, antibiotic prophylaxis was administered. VTE prophylaxis was in place. General anesthesia was then administered and tolerated well. After the induction, the chest and bilateral axillas was prepped with Chloraprep and draped in the sterile fashion. The patient was positioned in the supine position.  Procedure began on the patient's left by locating the area of maximal radiation output with the gamma probe. The area was marked and localized the 50-50 mixture 1% lidocaine 0.5% Marcaine plain and skin was incised with 15 blade scalpel and using both electrocautery was taken down through the axillary fascia. Using the gamma probe as a guide it was taken down until the maximal area of radiation was identified which was then grasped with a 2-0 silk suture in a figure-of-eight fashion for handle. Dissecting this out were able to identify green lymphatic channels going to a Green lymph node. The lymph node was grasped and excised using electrocautery and  passed off field. Off the field and it was found to be the source of the radioactive material. The other material within the silk suture was additional accessory tissue but without any radioactive material. The axilla itself was meticulously investigated and no additional areas of high radioactivity or blue lymphatics were identified. The axilla was copiously irrigated and meticulous hemostasis was ensured. He was then closed with an interrupted 3-0 Vicryl suture at the level of the axillary fascia and the deep dermal tissue. The skin was then reapproximated with a subcuticular 4-0 Monocryl.  We next turned our attention to the patient's right side. The identical procedure was performed. The maxillary radioactive activity was identified and marked and localized. Skin incised with 15 blade scalpel and using Bovie much cautery taken down through the axillar fat pad. He's been gamma probe to guide [place] the maximal area of radioactive material was elevated with a 2-0 silk suture and excised. It was confirmed to be the radioactive lymph node on the back table. An additional visible lymph node was identified and removed. It was not radioactive or blue. There were no blue lymphatics identified and there were no other obvious areas of radioactive material in the right axilla the completion of this. We then copiously irrigated the right axillary incision and ensured meticulous hemostasis. It was closed with an interrupted deep 2-0 Vicryl suture at the axillar fascia and the deep dermal tissues. The skin was also reapproximated with a subcuticular 4-0 Monocryl. Bilateral incisions were re-localized with the aforementioned local anesthetic. They were then sealed with surgical glue.  Patient tolerated procedure well. There were no immediate, complications. All counts were correct at the end of  the procedure. She is welcome general anesthesia and transferred to the PACU in good condition.  Findings: Bilateral axillary  sentinel lymph nodes.   Estimated Blood Loss: 20 mL         Drains: None         Specimens: Bilateral axillary sentinel lymph nodes and additional tissue          Complications: None                  Condition: good   [name], MD, FACS

## 2015-12-11 ENCOUNTER — Encounter: Payer: Self-pay | Admitting: General Surgery

## 2015-12-14 LAB — SURGICAL PATHOLOGY

## 2015-12-15 NOTE — Progress Notes (Signed)
_ [place]  [phone] [phone]  ID: Stephanie Crosby OB: 1936-03-26  MR#: [id number]  LKG#:[id number]  [name], MD as PCP - General (Family Medicine) [name], MD as Consulting Physician (General Surgery)  CHIEF COMPLAINT:   1. Clinical stage IIb ER positive, PR negative, HER-2 overexpressing invasive lobular carcinoma overlapping sites of the left breast, now pathologic stage Ia (T1c,N0,M0). 2. Clinical stage Ia ER/PR positive, HER-2 negative invasive ductal carcinoma of the upper outer quadrant of right breast, now pathologic stage 0.  INTERVAL HISTORY: Patient returns to clinic today for further evaluation and consideration of cycle 9 of Herceptin only. She recently had bilateral axillary node sampling which was negative for disease.  She currently feels well and is asymptomatic. She does not complain of thickened watery eye drainage today. She has no neurologic complaints. She denies any fevers. She denies any pain. She has no chest pain or shortness of breath. She denies any nausea, vomiting, constipation or diarrhea. She has no urinary complaints. Patient offers no further specific complaints.  REVIEW OF SYSTEMS:   Review of Systems  Constitutional: Negative for fever, malaise/fatigue and weight loss.  HENT: Negative for sore throat.   Eyes: Negative for discharge and redness.  Respiratory: Negative.  Negative for shortness of breath.   Cardiovascular: Negative.  Negative for chest pain and leg swelling.  Gastrointestinal: Negative.  Negative for blood in stool, diarrhea and melena.  Musculoskeletal: Negative.   Skin: Negative.   Neurological: Negative.  Negative for weakness.  Psychiatric/Behavioral: Negative.  The patient is not nervous/anxious.     As per HPI. Otherwise, a complete review of systems is negatve.  PAST MEDICAL HISTORY: Past Medical History:  Diagnosis Date  . Anemia   . Back  injury   . Bilateral breast cancer ([place]) 05/2015   Chemo tx's.  . Breast cancer in situ 05/2015   Left  . Edema of leg   . GERD (gastroesophageal reflux disease)   . Gonalgia   . Hiatal hernia   . Seasonal allergic rhinitis   . Sleep apnea   . Squamous cell skin cancer 2011   resected from left side of nose and Right axilla area.   . Tearing eyes    SINCE TAKING CHEMO    PAST SURGICAL HISTORY: Past Surgical History:  Procedure Laterality Date  . APPENDECTOMY    . AXILLARY SENTINEL NODE BIOPSY Bilateral 12/10/2015   Procedure: BILATERAL SENTINEL NODE BIOPSY, SENTINNEL NODE INJ.;  Surgeon: [name], MD;  Location: [facility] ORS;  Service: General;  Laterality: Bilateral;  . BREAST BIOPSY Bilateral 11/17/2015   Procedure: BREAST BIOPSY WITH NEEDLE LOCALIZATION;  Surgeon: [name], MD;  Location: [facility] ORS;  Service: General;  Laterality: Bilateral;  . CATARACT EXTRACTION, BILATERAL Bilateral 2012  . COLONOSCOPY    . PORTACATH PLACEMENT Right 06/10/2015   Procedure: INSERTION PORT-A-CATH;  Surgeon: [name], MD;  Location: [facility] ORS;  Service: General;  Laterality: Right;    FAMILY HISTORY: Patient reports a maternal aunt with breast cancer as well as a female cousin with breast cancer.  Family History  Problem Relation Age of Onset  . Dementia Mother   . Hyperlipidemia Mother   . Hypertension Mother   . Congestive Heart Failure Mother   . Heart disease Mother   . Aneurysm Father     abdominal  . Fibromyalgia Sister   . Heart disease Sister   . Cancer Maternal Uncle     brain  .  Heart disease Maternal Uncle   . Ovarian cancer Maternal Grandmother   . Cancer Cousin     breast cancer  . Breast cancer Neg Hx        ADVANCED DIRECTIVES:    HEALTH MAINTENANCE: Social History  Substance Use Topics  . Smoking status: Never Smoker  . Smokeless tobacco: Never Used  . Alcohol use 0.0 oz/week     Comment: 3 glasses wine/week      Allergies  Allergen  Reactions  . Penicillins Rash    Has patient had a PCN reaction causing immediate rash, facial/tongue/throat swelling, SOB or lightheadedness with hypotension: unsure Has patient had a PCN reaction causing severe rash involving mucus membranes or skin necrosis: no Has patient had a PCN reaction that required hospitalization no Has patient had a PCN reaction occurring within the last 10 years: no If all of the above answers are "NO", then may proceed with Cephalosporin use.    Current Outpatient Prescriptions  Medication Sig Dispense Refill  . acetaminophen (TYLENOL) 500 MG tablet Take 500 mg by mouth every 6 (six) hours as needed for mild pain.    Marland Kitchen lidocaine-prilocaine (EMLA) cream Apply 1 application topically as needed (for chemo therapy port access).     Marland Kitchen loratadine (CLARITIN) 10 MG tablet Take 10 mg by mouth every other day. ALTERNATES WITH BENADRYL    . Multiple Vitamins-Minerals (CENTRUM SILVER PO) Take 1 tablet by mouth daily. Reported on 06/15/2015    . omeprazole (PRILOSEC) 20 MG capsule Take 1 capsule by mouth every morning. Reported on 06/15/2015    . traMADol (ULTRAM) 50 MG tablet Take 1 tablet (50 mg total) by mouth every 8 (eight) hours as needed. (Patient taking differently: Take 50 mg by mouth every 8 (eight) hours as needed for moderate pain. ) 20 tablet 0  . valACYclovir (VALTREX) 500 MG tablet Take 500 mg by mouth daily as needed (break outs). Reported on 06/15/2015    . diphenhydrAMINE (SOMINEX) 25 MG tablet Take 25 mg by mouth every other day. ALTERNATES WITH CLARITIN     No current facility-administered medications for this visit.    Facility-Administered Medications Ordered in Other Visits  Medication Dose Route Frequency Provider Last Rate Last Dose  . sodium chloride flush (NS) 0.9 % injection 10 mL  10 mL Intracatheter PRN Lloyd Huger, MD        OBJECTIVE: Vitals:   12/16/15 1430  BP: 117/84  Pulse: 87  Temp: 97.7 F (36.5 C)     Body mass index is  23.15 kg/m.    ECOG FS:0 - Asymptomatic  General: Well-developed, well-nourished, no acute distress. Eyes: Pink conjunctiva, anicteric sclera. Breasts: Exam deferred today. Lungs: Clear to auscultation bilaterally. Heart: Regular rate and rhythm. No rubs, murmurs, or gallops. Abdomen: Soft, nontender, nondistended. No organomegaly noted, normoactive bowel sounds. Musculoskeletal: No edema, cyanosis, or clubbing. Neuro: Alert, answering all questions appropriately. Cranial nerves grossly intact. Skin: ecchymosis right breast. Psych: Normal affect.   LAB RESULTS:  Lab Results  Component Value Date   NA 137 12/16/2015   K 3.7 12/16/2015   CL 108 12/16/2015   CO2 24 12/16/2015   GLUCOSE 139 (H) 12/16/2015   BUN 16 12/16/2015   CREATININE 0.61 12/16/2015   CALCIUM 9.4 12/16/2015   PROT 6.8 12/16/2015   ALBUMIN 3.7 12/16/2015   AST 24 12/16/2015   ALT 24 12/16/2015   ALKPHOS 58 12/16/2015   BILITOT 0.8 12/16/2015   GFRNONAA >60 12/16/2015   GFRAA >  60 12/16/2015    Lab Results  Component Value Date   WBC 3.4 (L) 12/16/2015   NEUTROABS 2.1 12/16/2015   HGB 14.1 12/16/2015   HCT 41.9 12/16/2015   MCV 89.7 12/16/2015   PLT 215 12/16/2015   Lab Results  Component Value Date   IRON 10 (L) 09/15/2015   TIBC 395 09/15/2015   IRONPCTSAT 3 (L) 09/15/2015   Lab Results  Component Value Date   FERRITIN 14 09/15/2015     STUDIES: Ct Chest W Contrast  Result Date: 12/09/2015 CLINICAL DATA:  History of bilateral breast cancer with chemotherapy an last 3 months. Axillary node surgery tomorrow. Port-A-Cath. Followup of pulmonary nodule. EXAM: CT CHEST WITH CONTRAST TECHNIQUE: Multidetector CT imaging of the chest was performed during intravenous contrast administration. CONTRAST:  67m ISOVUE-300 IOPAMIDOL (ISOVUE-300) INJECTION 61% COMPARISON:  Chest radiograph 11/10/2015.  Chest CT 05/20/2015. FINDINGS: Cardiovascular: Aortic and branch vessel atherosclerosis. Tortuous thoracic  aorta. Mild cardiomegaly. Multivessel coronary artery atherosclerosis. No pericardial effusion. No central pulmonary embolism, on this non-dedicated study. Mediastinum/Nodes: No supraclavicular adenopathy. Hypo attenuating bilateral thyroid nodules by nonspecific. Soft tissue fullness in both breasts is likely related to recent biopsy, new. A right Port-A-Cath terminates at the high right atrium. No mediastinal or hilar adenopathy. Large hiatal hernia, with approximately 3/4 of the stomach positioned within the chest. Lungs/Pleura: Trace bilateral pleural effusions. New on the right and increased on the left. The right lower lobe pulmonary nodule which measures 6 x 4 mm on image 98/ series 3. Compare 5 x 4 mm on the prior. Appears more well-defined today, possibly due to is thinner slice collimation. 2 mm left lower lobe pulmonary nodules on images 84 and 81/series 3 are not readily apparent on the prior, likely due to small size. Anterior left upper lobe subpleural 3 mm nodule on image 44/ series 3 is likely present on the prior but obscured by minimal motion in this area. 3 mm left lower lobe pulmonary nodule on image 74/ series 3 is likely similar. Upper Abdomen: Normal imaged portions of the liver, spleen, pancreas, adrenal glands, left kidney. Mild right upper pole renal scarring. Musculoskeletal: Mild compression deformity involving T4 is is similar. S-shaped thoracolumbar spine curvature. IMPRESSION: 1. The right lower lobe pulmonary nodule is similar to minimally enlarged. More well-defined today, secondary to thinner slice combination. Other pulmonary nodules are primarily similar. There are tiny nodules which are not readily apparent on the prior, possibly related to technique and small size. 2.  Coronary artery atherosclerosis. Aortic atherosclerosis. 3. Otherwise, no evidence of metastatic disease. 4. Large hiatal hernia. Electronically Signed   By: KAbigail MiyamotoM.D.   On: 12/09/2015 09:27   Nm Cardiac  Muga Rest  Result Date: 11/23/2015 CLINICAL DATA:  Breast cancer. Evaluate cardiac function in relation to chemotherapy. EXAM: NUCLEAR MEDICINE CARDIAC BLOOD POOL IMAGING (MUGA) TECHNIQUE: Cardiac multi-gated acquisition was performed at rest following intravenous injection of Tc-938mabeled red blood cells. RADIOPHARMACEUTICALS:  20.6 mCi Tc-9950mP in-vitro labeled red blood cells IV COMPARISON:  08/14/2015, MUGA scan FINDINGS: No  focal wall motion abnormality of the left ventricle. Calculated left ventricular ejection fraction equals 63%. Previously EF equal 65% IMPRESSION: LEFT ventricular ejection fraction equals 63%. No significant change. Electronically Signed   By: SteSuzy BouchardD.   On: 11/23/2015 10:58  Nm Sentinel Node Injection  Result Date: 12/10/2015 CLINICAL DATA:  LEFT breast cancer. EXAM: NUCLEAR MEDICINE BREAST LYMPHOSCINTIGRAPHY LEFT TECHNIQUE: Intradermal injection of radiopharmaceutical was performed about the outer  aspect of the left nipple. The patient was then sent to the operating room where the sentinel node(s) were identified and removed by the surgeon. RADIOPHARMACEUTICALS:  0.17 millicuries of Tc 9 M sulfur colloid injected in the left periareolar region. IMPRESSION: Successful left breast injection for sentinel node study . Electronically Signed   By: Marcello Moores  Register   On: 12/10/2015 14:13   Nm Sentinel Node Injection  Result Date: 12/10/2015 CLINICAL DATA:  Right breast cancer. EXAM: NUCLEAR MEDICINE BREAST LYMPHOSCINTIGRAPHY RIGHT BREAST TECHNIQUE: Intradermal injection of radiopharmaceutical was performed at the 12 o'clock, 3 o'clock, 6 o'clock, and 9 o'clock positions around the right nipple. The patient was then sent to the operating room where the sentinel node(s) were identified and removed by the surgeon. RADIOPHARMACEUTICALS:  0.9 millicuries technetium 99 M sulfur colloid injected into the right periareolar region. IMPRESSION: Successful right breast injection  for sentinel node study. Electronically Signed   By: Marcello Moores  Register   On: 12/10/2015 14:01    ASSESSMENT:   1. Clinical stage IIb ER positive, PR negative, HER-2 overexpressing invasive lobular carcinoma overlapping sites of the left breast, now pathologic stage Ia (T1c,N0,M0). 2. Clinical stage Ia ER/PR positive, HER-2 negative invasive ductal carcinoma of the upper outer quadrant of right breast, now pathologic stage 0. 3. BRCA 1 and 2 negative.  PLAN:    1. Clinical stage IIb ER positive, PR negative, HER-2 overexpressing invasive lobular carcinoma overlapping sites of the left breast, now pathologic stage Ia (T1c,N0,M0): Patient noted to have residual stage IA tumor and her left breast with a complete pathologic response in her right breast. Patient has now completed 6 cycles of chemotherapy, but will continue with adjuvant Herceptin only. Proceed with cycle 9 of 18 today. MUGA scan from November 23, 2015 reported an EF of 63%.  She will also require an aromatase inhibitor after completion of adjuvant XRT. A referral was given to radiation oncology today.  Return to clinic in 3 weeks for consideration of cycle 10 of Herceptin only. 2. Clinical stage Ia ER/PR positive, HER-2 negative invasive ductal carcinoma of the upper outer quadrant of right breast, now pathologic stage 0: Complete pathologic response. Patient will require XRT and an aromatase inhibitor for 5 years as above. 3. Pulmonary nodule: CT from December 09, 2015 results reviewed independently with pulmonary lesion that is unchanged. No intervention is needed. Consider repeat CT scan in 6 months.   4. Anemia: Hemoglobin, improved. Patient received IV iron in June 2017. Repeat iron stores at next clinic visit. Monitor. 5. Eye drainage: Possible allergies. Continue OTC medications as directed.  6. Acid Reflux: Continue omeprazole. 7. Lower extremity edema:  MUGA scan as above.  Patient expressed understanding and was in agreement with this  plan. She also understands that She can call clinic at any time with any questions, concerns, or complaints.    Lloyd Huger, MD   12/20/2015 9:02 AM

## 2015-12-16 ENCOUNTER — Inpatient Hospital Stay: Payer: Medicare Other | Attending: Oncology | Admitting: Oncology

## 2015-12-16 ENCOUNTER — Inpatient Hospital Stay (HOSPITAL_BASED_OUTPATIENT_CLINIC_OR_DEPARTMENT_OTHER): Payer: Medicare Other

## 2015-12-16 ENCOUNTER — Inpatient Hospital Stay: Payer: Medicare Other

## 2015-12-16 VITALS — BP 123/78 | HR 77 | Temp 97.8°F | Resp 18

## 2015-12-16 VITALS — BP 117/84 | HR 87 | Temp 97.7°F | Wt 147.8 lb

## 2015-12-16 DIAGNOSIS — K449 Diaphragmatic hernia without obstruction or gangrene: Secondary | ICD-10-CM | POA: Diagnosis not present

## 2015-12-16 DIAGNOSIS — Z85828 Personal history of other malignant neoplasm of skin: Secondary | ICD-10-CM | POA: Diagnosis not present

## 2015-12-16 DIAGNOSIS — Z803 Family history of malignant neoplasm of breast: Secondary | ICD-10-CM | POA: Diagnosis not present

## 2015-12-16 DIAGNOSIS — R609 Edema, unspecified: Secondary | ICD-10-CM | POA: Insufficient documentation

## 2015-12-16 DIAGNOSIS — K219 Gastro-esophageal reflux disease without esophagitis: Secondary | ICD-10-CM

## 2015-12-16 DIAGNOSIS — C50411 Malignant neoplasm of upper-outer quadrant of right female breast: Secondary | ICD-10-CM | POA: Diagnosis not present

## 2015-12-16 DIAGNOSIS — D509 Iron deficiency anemia, unspecified: Secondary | ICD-10-CM

## 2015-12-16 DIAGNOSIS — R918 Other nonspecific abnormal finding of lung field: Secondary | ICD-10-CM | POA: Insufficient documentation

## 2015-12-16 DIAGNOSIS — C50812 Malignant neoplasm of overlapping sites of left female breast: Secondary | ICD-10-CM | POA: Diagnosis not present

## 2015-12-16 DIAGNOSIS — D649 Anemia, unspecified: Secondary | ICD-10-CM | POA: Diagnosis not present

## 2015-12-16 DIAGNOSIS — Z808 Family history of malignant neoplasm of other organs or systems: Secondary | ICD-10-CM | POA: Diagnosis not present

## 2015-12-16 DIAGNOSIS — Z8041 Family history of malignant neoplasm of ovary: Secondary | ICD-10-CM

## 2015-12-16 DIAGNOSIS — Z170 Estrogen receptor positive status [ER+]: Secondary | ICD-10-CM

## 2015-12-16 DIAGNOSIS — G4730 Sleep apnea, unspecified: Secondary | ICD-10-CM | POA: Diagnosis not present

## 2015-12-16 DIAGNOSIS — C50912 Malignant neoplasm of unspecified site of left female breast: Secondary | ICD-10-CM

## 2015-12-16 LAB — CBC WITH DIFFERENTIAL/PLATELET
BASOS ABS: 0 10*3/uL (ref 0–0.1)
Basophils Relative: 1 %
Eosinophils Absolute: 0.1 10*3/uL (ref 0–0.7)
Eosinophils Relative: 2 %
HEMATOCRIT: 41.9 % (ref 35.0–47.0)
HEMOGLOBIN: 14.1 g/dL (ref 12.0–16.0)
LYMPHS PCT: 29 %
Lymphs Abs: 1 10*3/uL (ref 1.0–3.6)
MCH: 30.3 pg (ref 26.0–34.0)
MCHC: 33.8 g/dL (ref 32.0–36.0)
MCV: 89.7 fL (ref 80.0–100.0)
MONO ABS: 0.3 10*3/uL (ref 0.2–0.9)
Monocytes Relative: 8 %
NEUTROS ABS: 2.1 10*3/uL (ref 1.4–6.5)
Neutrophils Relative %: 60 %
Platelets: 215 10*3/uL (ref 150–440)
RBC: 4.67 MIL/uL (ref 3.80–5.20)
RDW: 18 % — AB (ref 11.5–14.5)
WBC: 3.4 10*3/uL — ABNORMAL LOW (ref 3.6–11.0)

## 2015-12-16 LAB — COMPREHENSIVE METABOLIC PANEL
ALK PHOS: 58 U/L (ref 38–126)
ALT: 24 U/L (ref 14–54)
AST: 24 U/L (ref 15–41)
Albumin: 3.7 g/dL (ref 3.5–5.0)
Anion gap: 5 (ref 5–15)
BILIRUBIN TOTAL: 0.8 mg/dL (ref 0.3–1.2)
BUN: 16 mg/dL (ref 6–20)
CALCIUM: 9.4 mg/dL (ref 8.9–10.3)
CO2: 24 mmol/L (ref 22–32)
CREATININE: 0.61 mg/dL (ref 0.44–1.00)
Chloride: 108 mmol/L (ref 101–111)
GFR calc Af Amer: >60 mL/min (ref >60)
Glucose, Bld: 139 mg/dL — ABNORMAL HIGH (ref 65–99)
POTASSIUM: 3.7 mmol/L (ref 3.5–5.1)
Sodium: 137 mmol/L (ref 135–145)
TOTAL PROTEIN: 6.8 g/dL (ref 6.5–8.1)

## 2015-12-16 MED ORDER — SODIUM CHLORIDE 0.9% FLUSH
10.0000 mL | INTRAVENOUS | Status: DC | PRN
  Administered 2015-12-16: 10 mL via INTRAVENOUS
  Filled 2015-12-16: qty 10

## 2015-12-16 MED ORDER — HEPARIN SOD (PORK) LOCK FLUSH 100 UNIT/ML IV SOLN
500.0000 [IU] | Freq: Once | INTRAVENOUS | Status: AC
  Administered 2015-12-16: 500 [IU] via INTRAVENOUS
  Filled 2015-12-16: qty 5

## 2015-12-16 MED ORDER — SODIUM CHLORIDE 0.9% FLUSH
10.0000 mL | INTRAVENOUS | Status: DC | PRN
  Filled 2015-12-16: qty 10

## 2015-12-16 MED ORDER — TRASTUZUMAB CHEMO 150 MG IV SOLR
6.0000 mg/kg | Freq: Once | INTRAVENOUS | Status: AC
  Administered 2015-12-16: 462 mg via INTRAVENOUS
  Filled 2015-12-16: qty 22

## 2015-12-16 MED ORDER — DIPHENHYDRAMINE HCL 25 MG PO CAPS
25.0000 mg | ORAL_CAPSULE | Freq: Once | ORAL | Status: AC
  Administered 2015-12-16: 25 mg via ORAL
  Filled 2015-12-16: qty 1

## 2015-12-16 MED ORDER — HEPARIN SOD (PORK) LOCK FLUSH 100 UNIT/ML IV SOLN
500.0000 [IU] | Freq: Once | INTRAVENOUS | Status: DC | PRN
  Filled 2015-12-16: qty 5

## 2015-12-16 MED ORDER — SODIUM CHLORIDE 0.9 % IV SOLN
Freq: Once | INTRAVENOUS | Status: AC
  Administered 2015-12-16: 16:00:00 via INTRAVENOUS
  Filled 2015-12-16: qty 1000

## 2015-12-16 MED ORDER — ACETAMINOPHEN 325 MG PO TABS
650.0000 mg | ORAL_TABLET | Freq: Once | ORAL | Status: AC
  Administered 2015-12-16: 650 mg via ORAL
  Filled 2015-12-16: qty 2

## 2015-12-16 NOTE — Progress Notes (Signed)
Patient ambulates without assistance, brought to exam room 7, accompanied by her husband.  Patient denies pain or discomfort.  Patient states she's having intermittent loose stool over the last week.  BP 117/84 BP 87.  Vitals documented, medication record updated, information provided by patient.

## 2015-12-21 ENCOUNTER — Encounter: Payer: Self-pay | Admitting: General Surgery

## 2015-12-21 ENCOUNTER — Ambulatory Visit (INDEPENDENT_AMBULATORY_CARE_PROVIDER_SITE_OTHER): Payer: Medicare Other | Admitting: General Surgery

## 2015-12-21 VITALS — BP 160/87 | HR 84 | Temp 97.5°F | Wt 150.0 lb

## 2015-12-21 DIAGNOSIS — Z4889 Encounter for other specified surgical aftercare: Secondary | ICD-10-CM

## 2015-12-21 NOTE — Patient Instructions (Signed)
Please give [place] a call if you continue to have diarrhea so we could test you for C-Diff.  We will see you back in 3 weeks to check on your incisions.  Please let us know if you develop any fever, redness and/or drainage on your incisions.

## 2015-12-21 NOTE — Progress Notes (Signed)
Outpatient Surgical Follow Up  12/21/2015  Stephanie Crosby is an 80 y.o. female.   Chief Complaint  Patient presents with  . Routine Post Op    Post Op: Bilateral Sentinel Node Dr. Adonis Huguenin 12/10/2015    HPI: 80 year old female returns to clinic 2 weeks status post bilateral sentinel lymph node biopsies. Patient states she developed diarrhea today which she's had numerous times the last month and thinks it is unrelated to her surgery. She states she's had some bruising to the right axilla but none of note to the left. A little bit of soreness but no pain. She denies any fevers, chills, nausea, vomiting, chest pain, shortness of breath.  Past Medical History:  Diagnosis Date  . Anemia   . Back injury   . Bilateral breast cancer ([place]) 05/2015   Chemo tx's.  . Breast cancer in situ 05/2015   Left  . Edema of leg   . GERD (gastroesophageal reflux disease)   . Gonalgia   . Hiatal hernia   . Seasonal allergic rhinitis   . Sleep apnea   . Squamous cell skin cancer 2011   resected from left side of nose and Right axilla area.   . Tearing eyes    SINCE TAKING CHEMO    Past Surgical History:  Procedure Laterality Date  . APPENDECTOMY    . AXILLARY SENTINEL NODE BIOPSY Bilateral 12/10/2015   Procedure: BILATERAL SENTINEL NODE BIOPSY, SENTINNEL NODE INJ.;  Surgeon: [name], MD;  Location: [facility] ORS;  Service: General;  Laterality: Bilateral;  . BREAST BIOPSY Bilateral 11/17/2015   Procedure: BREAST BIOPSY WITH NEEDLE LOCALIZATION;  Surgeon: [name], MD;  Location: [facility] ORS;  Service: General;  Laterality: Bilateral;  . CATARACT EXTRACTION, BILATERAL Bilateral 2012  . COLONOSCOPY    . PORTACATH PLACEMENT Right 06/10/2015   Procedure: INSERTION PORT-A-CATH;  Surgeon: [name], MD;  Location: [facility] ORS;  Service: General;  Laterality: Right;    Family History  Problem Relation Age of Onset  . Dementia Mother   . Hyperlipidemia Mother   . Hypertension Mother   .  Congestive Heart Failure Mother   . Heart disease Mother   . Aneurysm Father     abdominal  . Fibromyalgia Sister   . Heart disease Sister   . Cancer Maternal Uncle     brain  . Heart disease Maternal Uncle   . Ovarian cancer Maternal Grandmother   . Cancer Cousin     breast cancer  . Breast cancer Neg Hx     Social History:  reports that she has never smoked. She has never used smokeless tobacco. She reports that she drinks alcohol. She reports that she does not use drugs.  Allergies:  Allergies  Allergen Reactions  . Penicillins Rash    Has patient had a PCN reaction causing immediate rash, facial/tongue/throat swelling, SOB or lightheadedness with hypotension: unsure Has patient had a PCN reaction causing severe rash involving mucus membranes or skin necrosis: no Has patient had a PCN reaction that required hospitalization no Has patient had a PCN reaction occurring within the last 10 years: no If all of the above answers are "NO", then may proceed with Cephalosporin use.    Medications reviewed.    ROS Multi-point review of systems was completed, all pertinent positives and negatives are documented within the history of present illness and remainder are negative.   BP (!) 160/87 (BP Location: Right Arm, Patient Position: Sitting, Cuff Size: Normal)   Pulse 84  Temp 97.5 F (36.4 C) (Oral)   Wt 68 kg (150 lb)   BMI 23.49 kg/m   Physical Exam Gen.: No acute distress Breasts: Bilateral breasts examined. Bilateral breast with a well approximated incisions with surgical glue in place. No evidence of erythema or drainage and either side. Bilateral axillas also with incisions with surgical glue in place. The right axilla has evidence of resolving ecchymosis caudad to the incision site. No evidence of erythema or purulent drainage from either axilla. Chest: Clear to auscultation Heart: Regular rhythm Abdomen: Soft and nontender    No results found for this or any  previous visit (from the past 48 hour(s)). No results found.  Assessment/Plan:  1. Aftercare following surgery 80 year old female status post bilateral sentinel lymph node biopsies for history of bilateral breast cancer. Pathology reviewed with patient which returned no evidence of malignancy to either axilla. She is scheduled to meet with the radiation oncologist later this week. Given that ecchymosis and the recent bouts of surgery discussed with patient will see her back in clinic for a follow-up breast exam in 3 weeks. Discussed signs and symptoms of infection and return to clinic sooner should they occur. Otherwise discussed that should her diarrhea worse and that she is to clinic no we will test her for possible C. difficile that she has had numerous antibiotics and chemotherapy in the recent past.     [name], MD [place] Surgeon  12/21/2015,10:20 AM

## 2015-12-25 ENCOUNTER — Ambulatory Visit: Payer: Medicare Other | Admitting: Radiation Oncology

## 2015-12-29 ENCOUNTER — Encounter: Payer: Self-pay | Admitting: Radiation Oncology

## 2015-12-29 ENCOUNTER — Ambulatory Visit
Admission: RE | Admit: 2015-12-29 | Discharge: 2015-12-29 | Disposition: A | Discharge status: Home / Self Care | Payer: Medicare Other | Source: Ambulatory Visit | Attending: Radiation Oncology | Admitting: Radiation Oncology

## 2015-12-29 VITALS — BP 143/94 | HR 91 | Temp 98.3°F | Wt 149.7 lb

## 2015-12-29 DIAGNOSIS — K219 Gastro-esophageal reflux disease without esophagitis: Secondary | ICD-10-CM | POA: Diagnosis not present

## 2015-12-29 DIAGNOSIS — R609 Edema, unspecified: Secondary | ICD-10-CM | POA: Insufficient documentation

## 2015-12-29 DIAGNOSIS — Z85828 Personal history of other malignant neoplasm of skin: Secondary | ICD-10-CM | POA: Insufficient documentation

## 2015-12-29 DIAGNOSIS — C50912 Malignant neoplasm of unspecified site of left female breast: Secondary | ICD-10-CM

## 2015-12-29 DIAGNOSIS — Z809 Family history of malignant neoplasm, unspecified: Secondary | ICD-10-CM | POA: Insufficient documentation

## 2015-12-29 DIAGNOSIS — Z510 Encounter for antineoplastic radiation therapy: Secondary | ICD-10-CM | POA: Diagnosis not present

## 2015-12-29 DIAGNOSIS — Z8041 Family history of malignant neoplasm of ovary: Secondary | ICD-10-CM | POA: Diagnosis not present

## 2015-12-29 DIAGNOSIS — G4730 Sleep apnea, unspecified: Secondary | ICD-10-CM | POA: Insufficient documentation

## 2015-12-29 DIAGNOSIS — Z9049 Acquired absence of other specified parts of digestive tract: Secondary | ICD-10-CM | POA: Insufficient documentation

## 2015-12-29 DIAGNOSIS — Z9221 Personal history of antineoplastic chemotherapy: Secondary | ICD-10-CM | POA: Insufficient documentation

## 2015-12-29 DIAGNOSIS — D0511 Intraductal carcinoma in situ of right breast: Secondary | ICD-10-CM | POA: Insufficient documentation

## 2015-12-29 DIAGNOSIS — Z79899 Other long term (current) drug therapy: Secondary | ICD-10-CM | POA: Insufficient documentation

## 2015-12-29 DIAGNOSIS — C50412 Malignant neoplasm of upper-outer quadrant of left female breast: Secondary | ICD-10-CM | POA: Insufficient documentation

## 2015-12-29 DIAGNOSIS — G629 Polyneuropathy, unspecified: Secondary | ICD-10-CM | POA: Diagnosis not present

## 2015-12-29 DIAGNOSIS — Z170 Estrogen receptor positive status [ER+]: Secondary | ICD-10-CM | POA: Diagnosis not present

## 2015-12-29 DIAGNOSIS — C50911 Malignant neoplasm of unspecified site of right female breast: Secondary | ICD-10-CM

## 2015-12-29 DIAGNOSIS — D649 Anemia, unspecified: Secondary | ICD-10-CM | POA: Diagnosis not present

## 2015-12-29 DIAGNOSIS — K449 Diaphragmatic hernia without obstruction or gangrene: Secondary | ICD-10-CM | POA: Diagnosis not present

## 2015-12-29 NOTE — Progress Notes (Signed)
Written and verbal information given regarding side effects, lab appointments and hygiene.  Patient and husband verbalized understanding of all information; questions answered to their satisfaction.  12 minutes spent going over educational needs , answering questions.

## 2015-12-29 NOTE — Consult Note (Signed)
Except an outstanding is perfect of Radiation Oncology NEW PATIENT EVALUATION  Name: Stephanie Crosby  MRN: [id number]  Date:   12/29/2015     DOB: 10/03/1935   This 80 y.o. 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  REFERRING PHYSICIAN: Maryland Pink, MD  CHIEF COMPLAINT:  Chief Complaint  Patient presents with  . Breast Cancer    Initial visit    DIAGNOSIS: The encounter diagnosis was Bilateral malignant neoplasm of breast in female, unspecified site of breast ([place]).   PREVIOUS INVESTIGATIONS:  Pathology reports reviewed MRI scans mammograms and ultrasound reviewed Clinical notes reviewed  HPI: Patient is a 80 year old female who presented with an abnormal mammogram of the left breast showing a 1.3 cm irregular mass with architectural distortion and hypoechoic by ultrasound in the 2:00 position 2 cm from the nipple. Needle localization was positive for invasive lobular carcinoma ER positive PR negative HER-2/neu overexpressed. She went on to have an MRI scan demonstrating the lesion in the left breast also a small lesion in the right breast measuring 2.6 cm in the upper outer quadrant of the right breast. There was a 7.5 cm area of enhancement in the left breast carcinoma to the biopsy-proven area of invasive lobular carcinoma. Patient underwent neoadjuvant chemotherapy consisting of carboplatinum and Taxotere as well as Herceptin. She then underwent bilateral wide local excisions again showing residual lobular carcinoma of the left breast now measuring 1.7 cm with margins clear but close at 1 mm. Right breast showed no residual disease. She then went on to have bilateral sentinel node biopsies 2 from each axilla all negative for metastatic  disease. She currently is on Herceptin tolerating that well. She has developed some peripheral neuropathy from her Taxotere. She otherwise specifically denies breast tenderness cough or bone pain. She is now referred to radiation oncology for consideration of bilateral breast treatment.  PLANNED TREATMENT REGIMEN: Bilateral whole breast radiation  PAST MEDICAL HISTORY:  has a past medical history of Anemia; Back injury; Bilateral breast cancer ([place]) (05/2015); Breast cancer in situ (05/2015); Edema of leg; GERD (gastroesophageal reflux disease); Gonalgia; Hiatal hernia; Seasonal allergic rhinitis; Sleep apnea; Squamous cell skin cancer (2011); and Tearing eyes.    PAST SURGICAL HISTORY:  Past Surgical History:  Procedure Laterality Date  . APPENDECTOMY    . AXILLARY SENTINEL NODE BIOPSY Bilateral 12/10/2015   Procedure: BILATERAL SENTINEL NODE BIOPSY, SENTINNEL NODE INJ.;  Surgeon: [name], MD;  Location: [facility] ORS;  Service: General;  Laterality: Bilateral;  . BREAST BIOPSY Bilateral 11/17/2015   Procedure: BREAST BIOPSY WITH NEEDLE LOCALIZATION;  Surgeon: [name], MD;  Location: [facility] ORS;  Service: General;  Laterality: Bilateral;  . CATARACT EXTRACTION, BILATERAL Bilateral 2012  . COLONOSCOPY    . PORTACATH PLACEMENT Right 06/10/2015   Procedure: INSERTION PORT-A-CATH;  Surgeon: [name], MD;  Location: [facility] ORS;  Service: General;  Laterality: Right;    FAMILY HISTORY: family history includes Aneurysm in her father; Cancer in her cousin and maternal uncle; Congestive Heart Failure in her mother; Dementia in her mother; Fibromyalgia in her sister; Heart disease in her maternal uncle, mother, and sister; Hyperlipidemia in her mother; Hypertension in her mother; Ovarian cancer in her maternal grandmother.  SOCIAL HISTORY:  reports that she has never smoked. She has never used  smokeless tobacco. She reports that she drinks alcohol. She reports that she does not use  drugs.  ALLERGIES: Penicillins  MEDICATIONS:  Current Outpatient Prescriptions  Medication Sig Dispense Refill  . acetaminophen (TYLENOL) 500 MG tablet Take 500 mg by mouth every 6 (six) hours as needed for mild pain.    . diphenhydrAMINE (SOMINEX) 25 MG tablet Take 25 mg by mouth every other day. ALTERNATES WITH CLARITIN    . lidocaine-prilocaine (EMLA) cream Apply 1 application topically as needed (for chemo therapy port access).     Marland Kitchen loratadine (CLARITIN) 10 MG tablet Take 10 mg by mouth every other day. ALTERNATES WITH BENADRYL    . Multiple Vitamins-Minerals (CENTRUM SILVER PO) Take 1 tablet by mouth daily. Reported on 06/15/2015    . omeprazole (PRILOSEC) 20 MG capsule Take 1 capsule by mouth every morning. Reported on 06/15/2015    . traMADol (ULTRAM) 50 MG tablet Take 1 tablet (50 mg total) by mouth every 8 (eight) hours as needed. (Patient taking differently: Take 50 mg by mouth every 8 (eight) hours as needed for moderate pain. ) 20 tablet 0  . valACYclovir (VALTREX) 500 MG tablet Take 500 mg by mouth daily as needed (break outs). Reported on 06/15/2015     No current facility-administered medications for this encounter.    Facility-Administered Medications Ordered in Other Encounters  Medication Dose Route Frequency Provider Last Rate Last Dose  . sodium chloride flush (NS) 0.9 % injection 10 mL  10 mL Intracatheter PRN Lloyd Huger, MD        ECOG PERFORMANCE STATUS:  0 - Asymptomatic  REVIEW OF SYSTEMS: Except for the peripheral neuropathy caused by chemotherapy fatigue Patient denies any weight loss, fatigue, weakness, fever, chills or night sweats. Patient denies any loss of vision, blurred vision. Patient denies any ringing  of the ears or hearing loss. No irregular heartbeat. Patient denies heart murmur or history of fainting. Patient denies any chest pain or pain radiating to her upper extremities. Patient denies any shortness of breath, difficulty breathing at night,  cough or hemoptysis. Patient denies any swelling in the lower legs. Patient denies any nausea vomiting, vomiting of blood, or coffee ground material in the vomitus. Patient denies any stomach pain. Patient states has had normal bowel movements no significant constipation or diarrhea. Patient denies any dysuria, hematuria or significant nocturia. Patient denies any problems walking, swelling in the joints or loss of balance. Patient denies any skin changes, loss of hair or loss of weight. Patient denies any excessive worrying or anxiety or significant depression. Patient denies any problems with insomnia. Patient denies excessive thirst, polyuria, polydipsia. Patient denies any swollen glands, patient denies easy bruising or easy bleeding. Patient denies any recent infections, allergies or URI. Patient "s visual fields have not changed significantly in recent time.    PHYSICAL EXAM: BP (!) 143/94   Pulse 91   Temp 98.3 F (36.8 C)   Wt 149 lb 11.1 oz (67.9 kg)   BMI 23.45 kg/m  She status post bilateral wide local excisions of each breast as well as sentinel lymph node biopsies. Incisions are all well-healed. No dominant mass or nodularity is noted in either breast in 2 positions examined. No axillary or supraclavicular adenopathy is identified. Well-developed well-nourished patient in NAD. HEENT reveals PERLA, EOMI, discs not visualized.  Oral cavity is clear. No oral mucosal lesions are identified. Neck is clear without evidence of cervical or supraclavicular adenopathy. Lungs are clear to A&P. Cardiac examination is  essentially unremarkable with regular rate and rhythm without murmur rub or thrill. Abdomen is benign with no organomegaly or masses noted. Motor sensory and DTR levels are equal and symmetric in the upper and lower extremities. Cranial nerves II through XII are grossly intact. Proprioception is intact. No peripheral adenopathy or edema is identified. No motor or sensory levels are noted.  Crude visual fields are within normal range.  LABORATORY DATA: Pathology reports reviewed    RADIOLOGY RESULTS: MRI scans ultrasound and mammography all reviewed   IMPRESSION: Left breast initial stage IIb ER/PR positive PR negative HER-2/neu overexpressed invasive lobular carcinoma status post neoadjuvant chemotherapy down stage II stage IA.  Right breast ductal carcinoma in situ with complete response after neoadjuvant chemotherapy  PLAN: At this time I to go ahead with bilateral breast radiation. I will plan on delivering 5000 cGy over 5 weeks to both breasts. I would boost her scar 1600 cGy to the left breast based on the close 1 mm margin. I would treat her right breast scar to 1400 cGy using electron beam. Risks and benefits of treatment including skin reaction fatigue alteration of blood counts possible inclusion of superficial lung all were discussed in detail with the patient and her husband. They both seem to comprehend my treatment plan well. I firstly set up and ordered CT simulation later this week. Patient will be candidate for aromatase inhibitor therapy after completion of radiation as well as continuation of Herceptin during treatment.There will be extra effort by both professional staff as well as technical staff to coordinate and manage concurrent chemoradiation and ensuing side effects during his treatments. Patient and husband both compromise treatment plan well.  I would like to take this opportunity to thank you for allowing me to participate in the care of your patient.Armstead Peaks., MD

## 2015-12-31 ENCOUNTER — Ambulatory Visit
Admission: RE | Admit: 2015-12-31 | Discharge: 2015-12-31 | Disposition: A | Discharge status: Home / Self Care | Payer: Medicare Other | Source: Ambulatory Visit | Attending: Radiation Oncology | Admitting: Radiation Oncology

## 2015-12-31 DIAGNOSIS — C50412 Malignant neoplasm of upper-outer quadrant of left female breast: Secondary | ICD-10-CM | POA: Diagnosis not present

## 2016-01-05 DIAGNOSIS — C50412 Malignant neoplasm of upper-outer quadrant of left female breast: Secondary | ICD-10-CM | POA: Diagnosis not present

## 2016-01-06 ENCOUNTER — Ambulatory Visit: Payer: Medicare Other | Admitting: Oncology

## 2016-01-06 ENCOUNTER — Other Ambulatory Visit: Payer: Medicare Other

## 2016-01-06 ENCOUNTER — Ambulatory Visit: Payer: Medicare Other

## 2016-01-08 ENCOUNTER — Other Ambulatory Visit: Payer: Self-pay | Admitting: *Deleted

## 2016-01-10 NOTE — Progress Notes (Signed)
_ [place]  Telephone:([phone] [phone]  ID: [name] OB: 1936/02/27  MR#: [id number]  PPJ#:[id number]  [name], MD as PCP - General (Family Medicine) [name], MD as Consulting Physician (General Surgery)  CHIEF COMPLAINT:   1. Clinical stage IIb ER positive, PR negative, HER-2 overexpressing invasive lobular carcinoma overlapping sites of the left breast, now pathologic stage Ia (T1c,N0,M0). 2. Clinical stage Ia ER/PR positive, HER-2 negative invasive ductal carcinoma of the upper outer quadrant of right breast, now pathologic stage 0.  INTERVAL HISTORY: Patient returns to clinic today for further evaluation and consideration of cycle 10 of Herceptin only. She recently initiated her adjuvant XRT. She currently feels well and is asymptomatic. She does not complain of thickened watery eye drainage today. She has no neurologic complaints. She denies any fevers. She denies any pain. She has no chest pain or shortness of breath. She denies any nausea, vomiting, constipation or diarrhea. She has no urinary complaints. Patient offers no specific complaints today.  REVIEW OF SYSTEMS:   Review of Systems  Constitutional: Negative for fever, malaise/fatigue and weight loss.  HENT: Negative for sore throat.   Eyes: Negative for discharge and redness.  Respiratory: Negative.  Negative for shortness of breath.   Cardiovascular: Negative.  Negative for chest pain and leg swelling.  Gastrointestinal: Negative.  Negative for blood in stool, diarrhea and melena.  Musculoskeletal: Negative.   Skin: Negative.   Neurological: Negative.  Negative for weakness.  Psychiatric/Behavioral: Negative.  The patient is not nervous/anxious.     As per HPI. Otherwise, a complete review of systems is negative.  PAST MEDICAL HISTORY: Past Medical History:  Diagnosis Date  . Anemia   . Back injury   . Bilateral breast cancer ([place])  05/2015   Chemo tx's.  . Breast cancer in situ 05/2015   Left  . Edema of leg   . GERD (gastroesophageal reflux disease)   . Gonalgia   . Hiatal hernia   . Seasonal allergic rhinitis   . Sleep apnea   . Squamous cell skin cancer 2011   resected from left side of nose and Right axilla area.   . Tearing eyes    SINCE TAKING CHEMO    PAST SURGICAL HISTORY: Past Surgical History:  Procedure Laterality Date  . APPENDECTOMY    . AXILLARY SENTINEL NODE BIOPSY Bilateral 12/10/2015   Procedure: BILATERAL SENTINEL NODE BIOPSY, SENTINNEL NODE INJ.;  Surgeon: [name], MD;  Location: [facility] ORS;  Service: General;  Laterality: Bilateral;  . BREAST BIOPSY Bilateral 11/17/2015   Procedure: BREAST BIOPSY WITH NEEDLE LOCALIZATION;  Surgeon: [name], MD;  Location: [facility] ORS;  Service: General;  Laterality: Bilateral;  . CATARACT EXTRACTION, BILATERAL Bilateral 2012  . COLONOSCOPY    . PORTACATH PLACEMENT Right 06/10/2015   Procedure: INSERTION PORT-A-CATH;  Surgeon: [name], MD;  Location: [facility] ORS;  Service: General;  Laterality: Right;    FAMILY HISTORY: Patient reports a maternal aunt with breast cancer as well as a female cousin with breast cancer.  Family History  Problem Relation Age of Onset  . Dementia Mother   . Hyperlipidemia Mother   . Hypertension Mother   . Congestive Heart Failure Mother   . Heart disease Mother   . Aneurysm Father     abdominal  . Fibromyalgia Sister   . Heart disease Sister   . Cancer Maternal Uncle     brain  . Heart disease Maternal Uncle   .  Ovarian cancer Maternal Grandmother   . Cancer Cousin     breast cancer  . Breast cancer Neg Hx        ADVANCED DIRECTIVES:    HEALTH MAINTENANCE: Social History  Substance Use Topics  . Smoking status: Never Smoker  . Smokeless tobacco: Never Used  . Alcohol use 0.0 oz/week     Comment: 3 glasses wine/week      Allergies  Allergen Reactions  . Penicillins Rash    Has patient  had a PCN reaction causing immediate rash, facial/tongue/throat swelling, SOB or lightheadedness with hypotension: unsure Has patient had a PCN reaction causing severe rash involving mucus membranes or skin necrosis: no Has patient had a PCN reaction that required hospitalization no Has patient had a PCN reaction occurring within the last 10 years: no If all of the above answers are "NO", then may proceed with Cephalosporin use.    Current Outpatient Prescriptions  Medication Sig Dispense Refill  . acetaminophen (TYLENOL) 500 MG tablet Take 500 mg by mouth every 6 (six) hours as needed for mild pain.    . diphenhydrAMINE (SOMINEX) 25 MG tablet Take 25 mg by mouth every other day. ALTERNATES WITH CLARITIN    . lidocaine-prilocaine (EMLA) cream Apply 1 application topically as needed (for chemo therapy port access).     Marland Kitchen loratadine (CLARITIN) 10 MG tablet Take 10 mg by mouth every other day. ALTERNATES WITH BENADRYL    . Multiple Vitamins-Minerals (CENTRUM SILVER PO) Take 1 tablet by mouth daily. Reported on 06/15/2015    . omeprazole (PRILOSEC) 20 MG capsule Take 1 capsule by mouth every morning. Reported on 06/15/2015    . traMADol (ULTRAM) 50 MG tablet Take 1 tablet (50 mg total) by mouth every 8 (eight) hours as needed. (Patient taking differently: Take 50 mg by mouth every 8 (eight) hours as needed for moderate pain. ) 20 tablet 0  . valACYclovir (VALTREX) 500 MG tablet Take 500 mg by mouth daily as needed (break outs). Reported on 06/15/2015    . gabapentin (NEURONTIN) 300 MG capsule Take 1 capsule (300 mg total) by mouth at bedtime. 30 capsule 2   No current facility-administered medications for this visit.    Facility-Administered Medications Ordered in Other Visits  Medication Dose Route Frequency Provider Last Rate Last Dose  . sodium chloride flush (NS) 0.9 % injection 10 mL  10 mL Intracatheter PRN Lloyd Huger, MD        OBJECTIVE: Vitals:   01/11/16 1356  BP: (!) 158/108   Pulse: 84  Resp: 18  Temp: (!) 96.8 F (36 C)     Body mass index is 23.31 kg/m.    ECOG FS:0 - Asymptomatic  General: Well-developed, well-nourished, no acute distress. Eyes: Pink conjunctiva, anicteric sclera. Breasts: Exam deferred today. Lungs: Clear to auscultation bilaterally. Heart: Regular rate and rhythm. No rubs, murmurs, or gallops. Abdomen: Soft, nontender, nondistended. No organomegaly noted, normoactive bowel sounds. Musculoskeletal: No edema, cyanosis, or clubbing. Neuro: Alert, answering all questions appropriately. Cranial nerves grossly intact. Skin: ecchymosis right breast. Psych: Normal affect.   LAB RESULTS:  Lab Results  Component Value Date   NA 135 01/11/2016   K 4.0 01/11/2016   CL 106 01/11/2016   CO2 25 01/11/2016   GLUCOSE 112 (H) 01/11/2016   BUN 14 01/11/2016   CREATININE 0.45 01/11/2016   CALCIUM 9.6 01/11/2016   PROT 6.9 01/11/2016   ALBUMIN 3.8 01/11/2016   AST 27 01/11/2016   ALT 29 01/11/2016  ALKPHOS 57 01/11/2016   BILITOT 1.0 01/11/2016   GFRNONAA >60 01/11/2016   GFRAA >60 01/11/2016    Lab Results  Component Value Date   WBC 4.0 01/11/2016   NEUTROABS 2.0 01/11/2016   HGB 14.7 01/11/2016   HCT 43.8 01/11/2016   MCV 90.3 01/11/2016   PLT 183 01/11/2016   Lab Results  Component Value Date   IRON 10 (L) 09/15/2015   TIBC 395 09/15/2015   IRONPCTSAT 3 (L) 09/15/2015   Lab Results  Component Value Date   FERRITIN 14 09/15/2015     STUDIES: No results found.  ASSESSMENT:   1. Clinical stage IIb ER positive, PR negative, HER-2 overexpressing invasive lobular carcinoma overlapping sites of the left breast, now pathologic stage Ia (T1c,N0,M0). 2. Clinical stage Ia ER/PR positive, HER-2 negative invasive ductal carcinoma of the upper outer quadrant of right breast, now pathologic stage 0. 3. BRCA 1 and 2 negative.  PLAN:    1. Clinical stage IIb ER positive, PR negative, HER-2 overexpressing invasive lobular  carcinoma overlapping sites of the left breast, now pathologic stage Ia (T1c,N0,M0): Patient noted to have residual stage IA tumor and her left breast with a complete pathologic response in her right breast. Patient has now completed 6 cycles of chemotherapy, but will continue with adjuvant Herceptin only. Proceed with cycle 10 of 18 today. MUGA scan from November 23, 2015 reported an EF of 63%, repeat in October 2017.  She will also require an aromatase inhibitor after completion of adjuvant XRT which will end on approximately February 10, 2016.  Return to clinic in 3 weeks for consideration of cycle 11 of Herceptin only. 2. Clinical stage Ia ER/PR positive, HER-2 negative invasive ductal carcinoma of the upper outer quadrant of right breast, now pathologic stage 0: Complete pathologic response. Patient will require XRT and an aromatase inhibitor for 5 years as above. 3. Pulmonary nodule: CT from December 09, 2015 results reviewed independently with pulmonary lesion that is unchanged. No intervention is needed. Consider repeat CT scan in 6 months.   4. Anemia: Hemoglobin, improved. Patient received IV iron in June 2017. Repeat iron stores at next clinic visit. Monitor. 5. Eye drainage: Possible allergies. Continue OTC medications as directed.  6. Acid Reflux: Continue omeprazole. 7. Lower extremity edema:  MUGA scan as above.  Patient expressed understanding and was in agreement with this plan. She also understands that She can call clinic at any time with any questions, concerns, or complaints.    Lloyd Huger, MD   01/11/2016 10:18 PM

## 2016-01-11 ENCOUNTER — Inpatient Hospital Stay: Payer: Medicare Other

## 2016-01-11 ENCOUNTER — Inpatient Hospital Stay: Payer: Medicare Other | Attending: Oncology | Admitting: Oncology

## 2016-01-11 ENCOUNTER — Ambulatory Visit
Admission: RE | Admit: 2016-01-11 | Discharge: 2016-01-11 | Disposition: A | Discharge status: Home / Self Care | Payer: Medicare Other | Source: Ambulatory Visit | Attending: Radiation Oncology | Admitting: Radiation Oncology

## 2016-01-11 VITALS — BP 158/108 | HR 84 | Temp 96.8°F | Resp 18 | Wt 148.8 lb

## 2016-01-11 DIAGNOSIS — C50812 Malignant neoplasm of overlapping sites of left female breast: Secondary | ICD-10-CM

## 2016-01-11 DIAGNOSIS — Z79899 Other long term (current) drug therapy: Secondary | ICD-10-CM | POA: Insufficient documentation

## 2016-01-11 DIAGNOSIS — K219 Gastro-esophageal reflux disease without esophagitis: Secondary | ICD-10-CM | POA: Diagnosis not present

## 2016-01-11 DIAGNOSIS — G4730 Sleep apnea, unspecified: Secondary | ICD-10-CM | POA: Insufficient documentation

## 2016-01-11 DIAGNOSIS — C50412 Malignant neoplasm of upper-outer quadrant of left female breast: Secondary | ICD-10-CM | POA: Diagnosis not present

## 2016-01-11 DIAGNOSIS — Z808 Family history of malignant neoplasm of other organs or systems: Secondary | ICD-10-CM | POA: Diagnosis not present

## 2016-01-11 DIAGNOSIS — Z5112 Encounter for antineoplastic immunotherapy: Secondary | ICD-10-CM | POA: Insufficient documentation

## 2016-01-11 DIAGNOSIS — C50912 Malignant neoplasm of unspecified site of left female breast: Secondary | ICD-10-CM

## 2016-01-11 DIAGNOSIS — D649 Anemia, unspecified: Secondary | ICD-10-CM | POA: Insufficient documentation

## 2016-01-11 DIAGNOSIS — R609 Edema, unspecified: Secondary | ICD-10-CM | POA: Insufficient documentation

## 2016-01-11 DIAGNOSIS — K449 Diaphragmatic hernia without obstruction or gangrene: Secondary | ICD-10-CM | POA: Insufficient documentation

## 2016-01-11 DIAGNOSIS — Z803 Family history of malignant neoplasm of breast: Secondary | ICD-10-CM | POA: Diagnosis not present

## 2016-01-11 DIAGNOSIS — Z85828 Personal history of other malignant neoplasm of skin: Secondary | ICD-10-CM | POA: Insufficient documentation

## 2016-01-11 DIAGNOSIS — Z170 Estrogen receptor positive status [ER+]: Secondary | ICD-10-CM | POA: Insufficient documentation

## 2016-01-11 DIAGNOSIS — C50411 Malignant neoplasm of upper-outer quadrant of right female breast: Secondary | ICD-10-CM | POA: Diagnosis not present

## 2016-01-11 LAB — CBC WITH DIFFERENTIAL/PLATELET
BASOS ABS: 0 10*3/uL (ref 0–0.1)
Basophils Relative: 1 %
EOS ABS: 0.1 10*3/uL (ref 0–0.7)
EOS PCT: 2 %
HCT: 43.8 % (ref 35.0–47.0)
Hemoglobin: 14.7 g/dL (ref 12.0–16.0)
Lymphocytes Relative: 38 %
Lymphs Abs: 1.5 10*3/uL (ref 1.0–3.6)
MCH: 30.3 pg (ref 26.0–34.0)
MCHC: 33.5 g/dL (ref 32.0–36.0)
MCV: 90.3 fL (ref 80.0–100.0)
MONO ABS: 0.4 10*3/uL (ref 0.2–0.9)
Monocytes Relative: 10 %
Neutro Abs: 2 10*3/uL (ref 1.4–6.5)
Neutrophils Relative %: 49 %
PLATELETS: 183 10*3/uL (ref 150–440)
RBC: 4.85 MIL/uL (ref 3.80–5.20)
RDW: 15.7 % — AB (ref 11.5–14.5)
WBC: 4 10*3/uL (ref 3.6–11.0)

## 2016-01-11 LAB — COMPREHENSIVE METABOLIC PANEL
ALT: 29 U/L (ref 14–54)
AST: 27 U/L (ref 15–41)
Albumin: 3.8 g/dL (ref 3.5–5.0)
Alkaline Phosphatase: 57 U/L (ref 38–126)
Anion gap: 4 — ABNORMAL LOW (ref 5–15)
BUN: 14 mg/dL (ref 6–20)
CHLORIDE: 106 mmol/L (ref 101–111)
CO2: 25 mmol/L (ref 22–32)
CREATININE: 0.45 mg/dL (ref 0.44–1.00)
Calcium: 9.6 mg/dL (ref 8.9–10.3)
Glucose, Bld: 112 mg/dL — ABNORMAL HIGH (ref 65–99)
POTASSIUM: 4 mmol/L (ref 3.5–5.1)
SODIUM: 135 mmol/L (ref 135–145)
Total Bilirubin: 1 mg/dL (ref 0.3–1.2)
Total Protein: 6.9 g/dL (ref 6.5–8.1)

## 2016-01-11 MED ORDER — DIPHENHYDRAMINE HCL 25 MG PO CAPS
25.0000 mg | ORAL_CAPSULE | Freq: Once | ORAL | Status: AC
  Administered 2016-01-11: 25 mg via ORAL
  Filled 2016-01-11: qty 1

## 2016-01-11 MED ORDER — SODIUM CHLORIDE 0.9 % IJ SOLN
10.0000 mL | Freq: Once | INTRAMUSCULAR | Status: AC
  Administered 2016-01-11: 10 mL via INTRAVENOUS
  Filled 2016-01-11: qty 10

## 2016-01-11 MED ORDER — HEPARIN SOD (PORK) LOCK FLUSH 100 UNIT/ML IV SOLN
500.0000 [IU] | Freq: Once | INTRAVENOUS | Status: AC
  Administered 2016-01-11: 500 [IU] via INTRAVENOUS
  Filled 2016-01-11: qty 5

## 2016-01-11 MED ORDER — TRASTUZUMAB CHEMO 150 MG IV SOLR
6.0000 mg/kg | Freq: Once | INTRAVENOUS | Status: AC
  Administered 2016-01-11: 399 mg via INTRAVENOUS
  Filled 2016-01-11: qty 19

## 2016-01-11 MED ORDER — SODIUM CHLORIDE 0.9 % IV SOLN
Freq: Once | INTRAVENOUS | Status: AC
  Administered 2016-01-11: 15:00:00 via INTRAVENOUS
  Filled 2016-01-11: qty 1000

## 2016-01-11 MED ORDER — ACETAMINOPHEN 325 MG PO TABS
650.0000 mg | ORAL_TABLET | Freq: Once | ORAL | Status: AC
  Administered 2016-01-11: 650 mg via ORAL
  Filled 2016-01-11: qty 2

## 2016-01-11 MED ORDER — TRASTUZUMAB CHEMO INJECTION 440 MG: 6.0000 mg/kg | Freq: Once | INTRAVENOUS | Status: DC

## 2016-01-11 MED ORDER — GABAPENTIN 300 MG PO CAPS: 300.0000 mg | ORAL_CAPSULE | Freq: Every day | ORAL | 2 refills | Status: DC

## 2016-01-11 NOTE — Progress Notes (Signed)
OK to use order set from 12/22/2015 per MD

## 2016-01-11 NOTE — Progress Notes (Signed)
Continues to have neuropathy in hands and feet that has not improved since finishing chemo.

## 2016-01-12 ENCOUNTER — Ambulatory Visit
Admission: RE | Admit: 2016-01-12 | Discharge: 2016-01-12 | Disposition: A | Discharge status: Home / Self Care | Payer: Medicare Other | Source: Ambulatory Visit | Attending: Radiation Oncology | Admitting: Radiation Oncology

## 2016-01-12 DIAGNOSIS — C50412 Malignant neoplasm of upper-outer quadrant of left female breast: Secondary | ICD-10-CM | POA: Diagnosis not present

## 2016-01-13 ENCOUNTER — Encounter: Payer: Self-pay | Admitting: General Surgery

## 2016-01-13 ENCOUNTER — Ambulatory Visit (INDEPENDENT_AMBULATORY_CARE_PROVIDER_SITE_OTHER): Payer: Medicare Other | Admitting: General Surgery

## 2016-01-13 ENCOUNTER — Ambulatory Visit
Admission: RE | Admit: 2016-01-13 | Discharge: 2016-01-13 | Disposition: A | Discharge status: Home / Self Care | Payer: Medicare Other | Source: Ambulatory Visit | Attending: Radiation Oncology | Admitting: Radiation Oncology

## 2016-01-13 VITALS — BP 145/92 | HR 78 | Temp 98.5°F | Ht 67.0 in | Wt 150.6 lb

## 2016-01-13 DIAGNOSIS — Z4889 Encounter for other specified surgical aftercare: Secondary | ICD-10-CM

## 2016-01-13 DIAGNOSIS — C50412 Malignant neoplasm of upper-outer quadrant of left female breast: Secondary | ICD-10-CM | POA: Diagnosis not present

## 2016-01-13 NOTE — Progress Notes (Signed)
Outpatient Surgical Follow Up  01/13/2016  Stephanie Crosby is an 80 y.o. female.   Chief Complaint  Patient presents with  . Routine Post Op    Bilateral Sentinel Node Biopsy (12/10/15)- Dr. Adonis Huguenin    HPI: 80 year old female returns to clinic 4 weeks status post bilateral sentinel lymph node biopsies or breast cancer. Patient reports she is started radiation therapy. She denies any pain, fevers, chills, chest pain, shortness of breath, diarrhea, consultation. She's been very happy with her surgical experience is thus far.  Past Medical History:  Diagnosis Date  . Anemia   . Back injury   . Bilateral breast cancer ([place]) 05/2015   Chemo tx's.  . Breast cancer in situ 05/2015   Left  . Edema of leg   . GERD (gastroesophageal reflux disease)   . Gonalgia   . Hiatal hernia   . Seasonal allergic rhinitis   . Sleep apnea   . Squamous cell skin cancer 2011   resected from left side of nose and Right axilla area.   . Tearing eyes    SINCE TAKING CHEMO    Past Surgical History:  Procedure Laterality Date  . APPENDECTOMY    . AXILLARY SENTINEL NODE BIOPSY Bilateral 12/10/2015   Procedure: BILATERAL SENTINEL NODE BIOPSY, SENTINNEL NODE INJ.;  Surgeon: [name], MD;  Location: [facility];  Service: General;  Laterality: Bilateral;  . BREAST BIOPSY Bilateral 11/17/2015   Procedure: BREAST BIOPSY WITH NEEDLE LOCALIZATION;  Surgeon: [name], MD;  Location: [facility];  Service: General;  Laterality: Bilateral;  . CATARACT EXTRACTION, BILATERAL Bilateral 2012  . COLONOSCOPY    . PORTACATH PLACEMENT Right 06/10/2015   Procedure: INSERTION PORT-A-CATH;  Surgeon: [name], MD;  Location: [facility] OR[facility];  Service: General;  Laterality: Right;    Family History  Problem Relation Age of Onset  . Dementia Mother   . Hyperlipidemia Mother   . Hypertension Mother   . Congestive Heart Failure Mother   . Heart disease Mother   . Aneurysm Father     abdominal  . Fibromyalgia Sister    . Heart disease Sister   . Cancer Maternal Uncle     brain  . Heart disease Maternal Uncle   . Ovarian cancer Maternal Grandmother   . Cancer Cousin     breast cancer  . Breast cancer Neg Hx     Social History:  reports that she has never smoked. She has never used smokeless tobacco. She reports that she drinks alcohol. She reports that she does not use drugs.  Allergies:  Allergies  Allergen Reactions  . Penicillins Rash    Has patient had a PCN reaction causing immediate rash, facial/tongue/throat swelling, SOB or lightheadedness with hypotension: unsure Has patient had a PCN reaction causing severe rash involving mucus membranes or skin necrosis: no Has patient had a PCN reaction that required hospitalization no Has patient had a PCN reaction occurring within the last 10 years: no If all of the above answers are "NO", then may proceed with Cephalosporin use.    Medications reviewed.    ROS A multipoint review of systems was completed, all pertinent positives and negatives are documented within the history of present illness remainder negative.   BP (!) 145/92   Pulse 78   Temp 98.5 F (36.9 C) (Oral)   Ht _0  (1.702 m)   Wt 68.3 kg (150 lb 9.6 oz)   BMI 23.59 kg/m   Physical Exam Gen.: No acute distress Neck:  Supple, nontender, no evidence of lymphadenopathy Breasts: Bilateral breasts with well-healed lumpectomy incision sites. Markings from radiation are present. No evidence of induration, inflammation, drainage. Chest: Clear to auscultation, Chemo-Port present into the right upper chest Axilla: Bilateral axillary biopsy sites well approximated without any evidence of induration, erythema, drainage. Abdomen: Soft and nontender     Results for orders placed or performed in visit on 01/11/16 (from the past 48 hour(s))  CBC with Differential     Status: Abnormal   Collection Time: 01/11/16  1:33 PM  Result Value Ref Range   WBC 4.0 3.6 - 11.0 K/uL   RBC  4.85 3.80 - 5.20 MIL/uL   Hemoglobin 14.7 12.0 - 16.0 g/dL   HCT 43.8 35.0 - 47.0 %   MCV 90.3 80.0 - 100.0 fL   MCH 30.3 26.0 - 34.0 pg   MCHC 33.5 32.0 - 36.0 g/dL   RDW 15.7 (H) 11.5 - 14.5 %   Platelets 183 150 - 440 K/uL   Neutrophils Relative % 49 %   Neutro Abs 2.0 1.4 - 6.5 K/uL   Lymphocytes Relative 38 %   Lymphs Abs 1.5 1.0 - 3.6 K/uL   Monocytes Relative 10 %   Monocytes Absolute 0.4 0.2 - 0.9 K/uL   Eosinophils Relative 2 %   Eosinophils Absolute 0.1 0 - 0.7 K/uL   Basophils Relative 1 %   Basophils Absolute 0.0 0 - 0.1 K/uL  Comprehensive metabolic panel     Status: Abnormal   Collection Time: 01/11/16  1:33 PM  Result Value Ref Range   Sodium 135 135 - 145 mmol/L   Potassium 4.0 3.5 - 5.1 mmol/L   Chloride 106 101 - 111 mmol/L   CO2 25 22 - 32 mmol/L   Glucose, Bld 112 (H) 65 - 99 mg/dL   BUN 14 6 - 20 mg/dL   Creatinine, Ser 0.45 0.44 - 1.00 mg/dL   Calcium 9.6 8.9 - 10.3 mg/dL   Total Protein 6.9 6.5 - 8.1 g/dL   Albumin 3.8 3.5 - 5.0 g/dL   AST 27 15 - 41 U/L   ALT 29 14 - 54 U/L   Alkaline Phosphatase 57 38 - 126 U/L   Total Bilirubin 1.0 0.3 - 1.2 mg/dL   GFR calc non Af Amer >60 >60 mL/min   GFR calc Af Amer >60 >60 mL/min    Comment: (NOTE) The eGFR has been calculated using the CKD EPI equation. This calculation has not been validated in all clinical situations. eGFR's persistently <60 mL/min signify possible Chronic Kidney Disease.    Anion gap 4 (L) 5 - 15   No results found.  Assessment/Plan:  1. Aftercare following surgery 80 year old female status post bilateral breast lumpectomies and bilateral breast sentinel lymph node biopsies. Doing well. Discussed signs and symptoms of infection or recurrence of cancer in detail. Patient voiced understanding. She'll follow up in 3 months for repeat breast exam or sooner should she have any palpable abnormalities. All questions answered to her satisfaction.     [name], MD FACS General  Surgeon  01/13/2016,12:11 PM

## 2016-01-13 NOTE — Patient Instructions (Signed)
We will see you back in December. Please call with any questions or concerns prior to this appointment.  I will call you when it is time for you to be seen in December.  Make sure that you have a breast exam at least once every 6 months for the 1st 2 years since surgery, then every year afterwards. If you are concerned about ANYTHING at any time, please call our office and we will have Dr. Adonis Huguenin see you in clinic.

## 2016-01-14 ENCOUNTER — Ambulatory Visit
Admission: RE | Admit: 2016-01-14 | Discharge: 2016-01-14 | Disposition: A | Discharge status: Home / Self Care | Payer: Medicare Other | Source: Ambulatory Visit | Attending: Radiation Oncology | Admitting: Radiation Oncology

## 2016-01-14 DIAGNOSIS — C50412 Malignant neoplasm of upper-outer quadrant of left female breast: Secondary | ICD-10-CM | POA: Diagnosis not present

## 2016-01-15 ENCOUNTER — Ambulatory Visit
Admission: RE | Admit: 2016-01-15 | Discharge: 2016-01-15 | Disposition: A | Discharge status: Home / Self Care | Payer: Medicare Other | Source: Ambulatory Visit | Attending: Radiation Oncology | Admitting: Radiation Oncology

## 2016-01-15 DIAGNOSIS — C50412 Malignant neoplasm of upper-outer quadrant of left female breast: Secondary | ICD-10-CM | POA: Diagnosis not present

## 2016-01-18 ENCOUNTER — Ambulatory Visit: Admission: RE | Admit: 2016-01-18 | Payer: Medicare Other | Source: Ambulatory Visit

## 2016-01-19 ENCOUNTER — Ambulatory Visit
Admission: RE | Admit: 2016-01-19 | Discharge: 2016-01-19 | Disposition: A | Discharge status: Home / Self Care | Payer: Medicare Other | Source: Ambulatory Visit | Attending: Radiation Oncology | Admitting: Radiation Oncology

## 2016-01-19 DIAGNOSIS — C50412 Malignant neoplasm of upper-outer quadrant of left female breast: Secondary | ICD-10-CM | POA: Diagnosis not present

## 2016-01-20 ENCOUNTER — Ambulatory Visit
Admission: RE | Admit: 2016-01-20 | Discharge: 2016-01-20 | Disposition: A | Discharge status: Home / Self Care | Payer: Medicare Other | Source: Ambulatory Visit | Attending: Radiation Oncology | Admitting: Radiation Oncology

## 2016-01-20 DIAGNOSIS — C50412 Malignant neoplasm of upper-outer quadrant of left female breast: Secondary | ICD-10-CM | POA: Diagnosis not present

## 2016-01-21 ENCOUNTER — Ambulatory Visit
Admission: RE | Admit: 2016-01-21 | Discharge: 2016-01-21 | Disposition: A | Discharge status: Home / Self Care | Payer: Medicare Other | Source: Ambulatory Visit | Attending: Radiation Oncology | Admitting: Radiation Oncology

## 2016-01-21 DIAGNOSIS — C50412 Malignant neoplasm of upper-outer quadrant of left female breast: Secondary | ICD-10-CM | POA: Diagnosis not present

## 2016-01-22 ENCOUNTER — Ambulatory Visit
Admission: RE | Admit: 2016-01-22 | Discharge: 2016-01-22 | Disposition: A | Discharge status: Home / Self Care | Payer: Medicare Other | Source: Ambulatory Visit | Attending: Radiation Oncology | Admitting: Radiation Oncology

## 2016-01-22 DIAGNOSIS — C50412 Malignant neoplasm of upper-outer quadrant of left female breast: Secondary | ICD-10-CM | POA: Diagnosis not present

## 2016-01-25 ENCOUNTER — Ambulatory Visit
Admission: RE | Admit: 2016-01-25 | Discharge: 2016-01-25 | Disposition: A | Discharge status: Home / Self Care | Payer: Medicare Other | Source: Ambulatory Visit | Attending: Radiation Oncology | Admitting: Radiation Oncology

## 2016-01-25 DIAGNOSIS — C50412 Malignant neoplasm of upper-outer quadrant of left female breast: Secondary | ICD-10-CM | POA: Diagnosis not present

## 2016-01-26 ENCOUNTER — Ambulatory Visit
Admission: RE | Admit: 2016-01-26 | Discharge: 2016-01-26 | Disposition: A | Discharge status: Home / Self Care | Payer: Medicare Other | Source: Ambulatory Visit | Attending: Radiation Oncology | Admitting: Radiation Oncology

## 2016-01-26 DIAGNOSIS — C50412 Malignant neoplasm of upper-outer quadrant of left female breast: Secondary | ICD-10-CM | POA: Diagnosis not present

## 2016-01-27 ENCOUNTER — Ambulatory Visit
Admission: RE | Admit: 2016-01-27 | Discharge: 2016-01-27 | Disposition: A | Discharge status: Home / Self Care | Payer: Medicare Other | Source: Ambulatory Visit | Attending: Radiation Oncology | Admitting: Radiation Oncology

## 2016-01-27 DIAGNOSIS — C50412 Malignant neoplasm of upper-outer quadrant of left female breast: Secondary | ICD-10-CM | POA: Diagnosis not present

## 2016-01-28 ENCOUNTER — Ambulatory Visit
Admission: RE | Admit: 2016-01-28 | Discharge: 2016-01-28 | Disposition: A | Discharge status: Home / Self Care | Payer: Medicare Other | Source: Ambulatory Visit | Attending: Radiation Oncology | Admitting: Radiation Oncology

## 2016-01-28 DIAGNOSIS — C50412 Malignant neoplasm of upper-outer quadrant of left female breast: Secondary | ICD-10-CM | POA: Diagnosis not present

## 2016-01-29 ENCOUNTER — Ambulatory Visit
Admission: RE | Admit: 2016-01-29 | Discharge: 2016-01-29 | Disposition: A | Discharge status: Home / Self Care | Payer: Medicare Other | Source: Ambulatory Visit | Attending: Radiation Oncology | Admitting: Radiation Oncology

## 2016-01-29 DIAGNOSIS — C50412 Malignant neoplasm of upper-outer quadrant of left female breast: Secondary | ICD-10-CM | POA: Diagnosis not present

## 2016-01-31 NOTE — Progress Notes (Signed)
_ [place]  [phone] [phone]  ID: Stephanie Crosby OB: 1935/10/01  MR#: [id number]  EPP#:[id number]  [name], MD as PCP - General (Family Medicine) [name], MD as Consulting Physician (General Surgery)  CHIEF COMPLAINT:   1. Clinical stage IIb ER positive, PR negative, HER-2 overexpressing invasive lobular carcinoma overlapping sites of the left breast, now pathologic stage Ia (T1c,N0,M0). 2. Clinical stage Ia ER/PR positive, HER-2 negative invasive ductal carcinoma of the upper outer quadrant of right breast, now pathologic stage 0.  INTERVAL HISTORY: Patient returns to clinic today for further evaluation and consideration of cycle 11 of Herceptin only. She is tolerating her XRT well without significant side effects. She currently feels well and is asymptomatic. She does not complain of thickened watery eye drainage today. She has no neurologic complaints. She denies any fevers. She denies any pain. She has no chest pain or shortness of breath. She denies any nausea, vomiting, constipation or diarrhea. She has no urinary complaints. Patient offers no specific complaints today.  REVIEW OF SYSTEMS:   Review of Systems  Constitutional: Negative for fever, malaise/fatigue and weight loss.  HENT: Negative for sore throat.   Eyes: Negative for discharge and redness.  Respiratory: Negative.  Negative for shortness of breath.   Cardiovascular: Negative.  Negative for chest pain and leg swelling.  Gastrointestinal: Negative.  Negative for blood in stool, diarrhea and melena.  Musculoskeletal: Negative.   Skin: Negative.   Neurological: Negative.  Negative for weakness.  Psychiatric/Behavioral: Negative.  The patient is not nervous/anxious.     As per HPI. Otherwise, a complete review of systems is negative.  PAST MEDICAL HISTORY: Past Medical History:  Diagnosis Date  . Anemia   . Back injury   . Bilateral  breast cancer ([place]) 05/2015   Chemo tx's.  . Breast cancer in situ 05/2015   Left  . Edema of leg   . GERD (gastroesophageal reflux disease)   . Gonalgia   . Hiatal hernia   . Seasonal allergic rhinitis   . Sleep apnea   . Squamous cell skin cancer 2011   resected from left side of nose and Right axilla area.   . Tearing eyes    SINCE TAKING CHEMO    PAST SURGICAL HISTORY: Past Surgical History:  Procedure Laterality Date  . APPENDECTOMY    . AXILLARY SENTINEL NODE BIOPSY Bilateral 12/10/2015   Procedure: BILATERAL SENTINEL NODE BIOPSY, SENTINNEL NODE INJ.;  Surgeon: [name], MD;  Location: [facility];  Service: General;  Laterality: Bilateral;  . BREAST BIOPSY Bilateral 11/17/2015   Procedure: BREAST BIOPSY WITH NEEDLE LOCALIZATION;  Surgeon: [name], MD;  Location: [facility];  Service: General;  Laterality: Bilateral;  . CATARACT EXTRACTION, BILATERAL Bilateral 2012  . COLONOSCOPY    . PORTACATH PLACEMENT Right 06/10/2015   Procedure: INSERTION PORT-A-CATH;  Surgeon: [name], MD;  Location: [facility];  Service: General;  Laterality: Right;    FAMILY HISTORY: Patient reports a maternal aunt with breast cancer as well as a female cousin with breast cancer.  Family History  Problem Relation Age of Onset  . Dementia Mother   . Hyperlipidemia Mother   . Hypertension Mother   . Congestive Heart Failure Mother   . Heart disease Mother   . Aneurysm Father     abdominal  . Fibromyalgia Sister   . Heart disease Sister   . Cancer Maternal Uncle     brain  . Heart  disease Maternal Uncle   . Ovarian cancer Maternal Grandmother   . Cancer Cousin     breast cancer  . Breast cancer Neg Hx        ADVANCED DIRECTIVES:    HEALTH MAINTENANCE: Social History  Substance Use Topics  . Smoking status: Never Smoker  . Smokeless tobacco: Never Used  . Alcohol use 0.0 oz/week     Comment: 3 glasses wine/week      Allergies  Allergen Reactions  . Penicillins  Rash    Has patient had a PCN reaction causing immediate rash, facial/tongue/throat swelling, SOB or lightheadedness with hypotension: unsure Has patient had a PCN reaction causing severe rash involving mucus membranes or skin necrosis: no Has patient had a PCN reaction that required hospitalization no Has patient had a PCN reaction occurring within the last 10 years: no If all of the above answers are "NO", then may proceed with Cephalosporin use.    Current Outpatient Prescriptions  Medication Sig Dispense Refill  . acetaminophen (TYLENOL) 500 MG tablet Take 500 mg by mouth every 6 (six) hours as needed for mild pain.    . diphenhydrAMINE (SOMINEX) 25 MG tablet Take 25 mg by mouth every other day. ALTERNATES WITH CLARITIN    . gabapentin (NEURONTIN) 300 MG capsule Take 1 capsule (300 mg total) by mouth at bedtime. 30 capsule 2  . lidocaine-prilocaine (EMLA) cream Apply 1 application topically as needed (for chemo therapy port access).     Marland Kitchen loratadine (CLARITIN) 10 MG tablet Take 10 mg by mouth every other day. ALTERNATES WITH BENADRYL    . Multiple Vitamins-Minerals (CENTRUM SILVER PO) Take 1 tablet by mouth daily. Reported on 06/15/2015    . omeprazole (PRILOSEC) 20 MG capsule Take 1 capsule by mouth every morning. Reported on 06/15/2015    . traMADol (ULTRAM) 50 MG tablet Take 1 tablet (50 mg total) by mouth every 8 (eight) hours as needed. (Patient taking differently: Take 50 mg by mouth every 8 (eight) hours as needed for moderate pain. ) 20 tablet 0  . valACYclovir (VALTREX) 500 MG tablet Take 500 mg by mouth daily as needed (break outs). Reported on 06/15/2015     No current facility-administered medications for this visit.    Facility-Administered Medications Ordered in Other Visits  Medication Dose Route Frequency Provider Last Rate Last Dose  . sodium chloride flush (NS) 0.9 % injection 10 mL  10 mL Intracatheter PRN Lloyd Huger, MD        OBJECTIVE: There were no vitals  filed for this visit.   There is no height or weight on file to calculate BMI.    ECOG FS:0 - Asymptomatic  General: Well-developed, well-nourished, no acute distress. Eyes: Pink conjunctiva, anicteric sclera. Breasts: Exam deferred today. Lungs: Clear to auscultation bilaterally. Heart: Regular rate and rhythm. No rubs, murmurs, or gallops. Abdomen: Soft, nontender, nondistended. No organomegaly noted, normoactive bowel sounds. Musculoskeletal: No edema, cyanosis, or clubbing. Neuro: Alert, answering all questions appropriately. Cranial nerves grossly intact. Skin: ecchymosis right breast. Psych: Normal affect.   LAB RESULTS:  Lab Results  Component Value Date   NA 139 02/01/2016   K 3.7 02/01/2016   CL 110 02/01/2016   CO2 25 02/01/2016   GLUCOSE 136 (H) 02/01/2016   BUN 18 02/01/2016   CREATININE 0.66 02/01/2016   CALCIUM 9.4 02/01/2016   PROT 6.7 02/01/2016   ALBUMIN 3.8 02/01/2016   AST 22 02/01/2016   ALT 19 02/01/2016   ALKPHOS 55 02/01/2016  BILITOT 0.9 02/01/2016   GFRNONAA >60 02/01/2016   GFRAA >60 02/01/2016    Lab Results  Component Value Date   WBC 3.5 (L) 02/01/2016   NEUTROABS 2.3 02/01/2016   HGB 14.2 02/01/2016   HCT 41.8 02/01/2016   MCV 90.5 02/01/2016   PLT 193 02/01/2016   Lab Results  Component Value Date   IRON 10 (L) 09/15/2015   TIBC 395 09/15/2015   IRONPCTSAT 3 (L) 09/15/2015   Lab Results  Component Value Date   FERRITIN 14 09/15/2015     STUDIES: No results found.  ASSESSMENT:   1. Clinical stage IIb ER positive, PR negative, HER-2 overexpressing invasive lobular carcinoma overlapping sites of the left breast, now pathologic stage Ia (T1c,N0,M0). 2. Clinical stage Ia ER/PR positive, HER-2 negative invasive ductal carcinoma of the upper outer quadrant of right breast, now pathologic stage 0. 3. BRCA 1 and 2 negative.  PLAN:    1. Clinical stage IIb ER positive, PR negative, HER-2 overexpressing invasive lobular  carcinoma overlapping sites of the left breast, now pathologic stage Ia (T1c,N0,M0): Patient noted to have residual stage IA tumor and her left breast with a complete pathologic response in her right breast. Patient has now completed 6 cycles of chemotherapy, but will continue with adjuvant Herceptin only. Proceed with cycle 11 of 18 today. MUGA scan from November 23, 2015 reported an EF of 63%, repeat in October 2017. She will also require an aromatase inhibitor after completion of adjuvant XRT which will end on approximately February 10, 2016.  Return to clinic in 3 weeks for consideration of cycle 12 of Herceptin only. 2. Clinical stage Ia ER/PR positive, HER-2 negative invasive ductal carcinoma of the upper outer quadrant of right breast, now pathologic stage 0: Complete pathologic response. Patient will require XRT and an aromatase inhibitor for 5 years as above. 3. Pulmonary nodule: CT from December 09, 2015 results reviewed independently with pulmonary lesion that is unchanged. No intervention is needed. Consider repeat CT scan in 6 months.   4. Anemia: Hemoglobin, improved. Patient received IV iron in June 2017. Repeat iron stores at next clinic visit. Monitor. 5. Eye drainage: Possible allergies. Continue OTC medications as directed.  6. Acid Reflux: Continue omeprazole. 7. Lower extremity edema:  MUGA scan as above.  Patient expressed understanding and was in agreement with this plan. She also understands that She can call clinic at any time with any questions, concerns, or complaints.    Lloyd Huger, MD   02/02/2016 2:33 PM

## 2016-02-01 ENCOUNTER — Inpatient Hospital Stay (HOSPITAL_BASED_OUTPATIENT_CLINIC_OR_DEPARTMENT_OTHER): Payer: Medicare Other | Admitting: Oncology

## 2016-02-01 ENCOUNTER — Inpatient Hospital Stay: Payer: Medicare Other

## 2016-02-01 ENCOUNTER — Inpatient Hospital Stay: Payer: Medicare Other | Attending: Oncology

## 2016-02-01 ENCOUNTER — Ambulatory Visit
Admission: RE | Admit: 2016-02-01 | Discharge: 2016-02-01 | Disposition: A | Discharge status: Home / Self Care | Payer: Medicare Other | Source: Ambulatory Visit | Attending: Radiation Oncology | Admitting: Radiation Oncology

## 2016-02-01 VITALS — BP 130/80 | HR 74 | Temp 97.7°F | Resp 18

## 2016-02-01 DIAGNOSIS — Z5112 Encounter for antineoplastic immunotherapy: Secondary | ICD-10-CM | POA: Diagnosis not present

## 2016-02-01 DIAGNOSIS — C50411 Malignant neoplasm of upper-outer quadrant of right female breast: Secondary | ICD-10-CM | POA: Diagnosis not present

## 2016-02-01 DIAGNOSIS — G4730 Sleep apnea, unspecified: Secondary | ICD-10-CM

## 2016-02-01 DIAGNOSIS — Z923 Personal history of irradiation: Secondary | ICD-10-CM | POA: Diagnosis not present

## 2016-02-01 DIAGNOSIS — C50812 Malignant neoplasm of overlapping sites of left female breast: Secondary | ICD-10-CM

## 2016-02-01 DIAGNOSIS — Z85828 Personal history of other malignant neoplasm of skin: Secondary | ICD-10-CM

## 2016-02-01 DIAGNOSIS — R600 Localized edema: Secondary | ICD-10-CM

## 2016-02-01 DIAGNOSIS — Z170 Estrogen receptor positive status [ER+]: Secondary | ICD-10-CM

## 2016-02-01 DIAGNOSIS — C50912 Malignant neoplasm of unspecified site of left female breast: Secondary | ICD-10-CM

## 2016-02-01 DIAGNOSIS — Z8041 Family history of malignant neoplasm of ovary: Secondary | ICD-10-CM

## 2016-02-01 DIAGNOSIS — L600 Ingrowing nail: Secondary | ICD-10-CM | POA: Diagnosis not present

## 2016-02-01 DIAGNOSIS — R918 Other nonspecific abnormal finding of lung field: Secondary | ICD-10-CM | POA: Diagnosis not present

## 2016-02-01 DIAGNOSIS — Z23 Encounter for immunization: Secondary | ICD-10-CM | POA: Diagnosis not present

## 2016-02-01 DIAGNOSIS — Z79899 Other long term (current) drug therapy: Secondary | ICD-10-CM

## 2016-02-01 DIAGNOSIS — D649 Anemia, unspecified: Secondary | ICD-10-CM

## 2016-02-01 DIAGNOSIS — Z803 Family history of malignant neoplasm of breast: Secondary | ICD-10-CM

## 2016-02-01 DIAGNOSIS — K449 Diaphragmatic hernia without obstruction or gangrene: Secondary | ICD-10-CM | POA: Diagnosis not present

## 2016-02-01 DIAGNOSIS — Z808 Family history of malignant neoplasm of other organs or systems: Secondary | ICD-10-CM | POA: Insufficient documentation

## 2016-02-01 DIAGNOSIS — Z9221 Personal history of antineoplastic chemotherapy: Secondary | ICD-10-CM | POA: Insufficient documentation

## 2016-02-01 DIAGNOSIS — K219 Gastro-esophageal reflux disease without esophagitis: Secondary | ICD-10-CM | POA: Insufficient documentation

## 2016-02-01 DIAGNOSIS — C50412 Malignant neoplasm of upper-outer quadrant of left female breast: Secondary | ICD-10-CM | POA: Diagnosis not present

## 2016-02-01 LAB — COMPREHENSIVE METABOLIC PANEL
ALK PHOS: 55 U/L (ref 38–126)
ALT: 19 U/L (ref 14–54)
AST: 22 U/L (ref 15–41)
Albumin: 3.8 g/dL (ref 3.5–5.0)
Anion gap: 4 — ABNORMAL LOW (ref 5–15)
BUN: 18 mg/dL (ref 6–20)
CALCIUM: 9.4 mg/dL (ref 8.9–10.3)
CO2: 25 mmol/L (ref 22–32)
CREATININE: 0.66 mg/dL (ref 0.44–1.00)
Chloride: 110 mmol/L (ref 101–111)
GFR calc Af Amer: >60 mL/min (ref >60)
GLUCOSE: 136 mg/dL — AB (ref 65–99)
Potassium: 3.7 mmol/L (ref 3.5–5.1)
SODIUM: 139 mmol/L (ref 135–145)
TOTAL PROTEIN: 6.7 g/dL (ref 6.5–8.1)
Total Bilirubin: 0.9 mg/dL (ref 0.3–1.2)

## 2016-02-01 LAB — CBC WITH DIFFERENTIAL/PLATELET
Basophils Absolute: 0 10*3/uL (ref 0–0.1)
Basophils Relative: 1 %
EOS ABS: 0 10*3/uL (ref 0–0.7)
EOS PCT: 1 %
HCT: 41.8 % (ref 35.0–47.0)
HEMOGLOBIN: 14.2 g/dL (ref 12.0–16.0)
LYMPHS ABS: 0.8 10*3/uL — AB (ref 1.0–3.6)
Lymphocytes Relative: 24 %
MCH: 30.9 pg (ref 26.0–34.0)
MCHC: 34.1 g/dL (ref 32.0–36.0)
MCV: 90.5 fL (ref 80.0–100.0)
MONOS PCT: 9 %
Monocytes Absolute: 0.3 10*3/uL (ref 0.2–0.9)
Neutro Abs: 2.3 10*3/uL (ref 1.4–6.5)
Neutrophils Relative %: 65 %
PLATELETS: 193 10*3/uL (ref 150–440)
RBC: 4.62 MIL/uL (ref 3.80–5.20)
RDW: 14.7 % — ABNORMAL HIGH (ref 11.5–14.5)
WBC: 3.5 10*3/uL — ABNORMAL LOW (ref 3.6–11.0)

## 2016-02-01 MED ORDER — HEPARIN SOD (PORK) LOCK FLUSH 100 UNIT/ML IV SOLN
500.0000 [IU] | Freq: Once | INTRAVENOUS | Status: AC | PRN
  Administered 2016-02-01: 500 [IU]
  Filled 2016-02-01 (×2): qty 5

## 2016-02-01 MED ORDER — ACETAMINOPHEN 325 MG PO TABS
650.0000 mg | ORAL_TABLET | Freq: Once | ORAL | Status: AC
Start: 2016-02-01 — End: 2016-02-01
  Administered 2016-02-01: 650 mg via ORAL
  Filled 2016-02-01: qty 2

## 2016-02-01 MED ORDER — TRASTUZUMAB CHEMO 150 MG IV SOLR
399.0000 mg | Freq: Once | INTRAVENOUS | Status: AC
  Administered 2016-02-01: 399 mg via INTRAVENOUS
  Filled 2016-02-01: qty 19

## 2016-02-01 MED ORDER — SODIUM CHLORIDE 0.9 % IV SOLN
Freq: Once | INTRAVENOUS | Status: AC
  Administered 2016-02-01: 15:00:00 via INTRAVENOUS
  Filled 2016-02-01: qty 1000

## 2016-02-01 MED ORDER — DIPHENHYDRAMINE HCL 25 MG PO CAPS
25.0000 mg | ORAL_CAPSULE | Freq: Once | ORAL | Status: AC
  Administered 2016-02-01: 25 mg via ORAL
  Filled 2016-02-01: qty 1

## 2016-02-01 MED ORDER — SODIUM CHLORIDE 0.9% FLUSH
10.0000 mL | INTRAVENOUS | Status: DC | PRN
  Administered 2016-02-01: 10 mL
  Filled 2016-02-01: qty 10

## 2016-02-02 ENCOUNTER — Ambulatory Visit
Admission: RE | Admit: 2016-02-02 | Discharge: 2016-02-02 | Disposition: A | Discharge status: Home / Self Care | Payer: Medicare Other | Source: Ambulatory Visit | Attending: Radiation Oncology | Admitting: Radiation Oncology

## 2016-02-02 DIAGNOSIS — C50412 Malignant neoplasm of upper-outer quadrant of left female breast: Secondary | ICD-10-CM | POA: Diagnosis not present

## 2016-02-03 ENCOUNTER — Ambulatory Visit
Admission: RE | Admit: 2016-02-03 | Discharge: 2016-02-03 | Disposition: A | Discharge status: Home / Self Care | Payer: Medicare Other | Source: Ambulatory Visit | Attending: Radiation Oncology | Admitting: Radiation Oncology

## 2016-02-03 DIAGNOSIS — C50412 Malignant neoplasm of upper-outer quadrant of left female breast: Secondary | ICD-10-CM | POA: Diagnosis not present

## 2016-02-04 ENCOUNTER — Ambulatory Visit
Admission: RE | Admit: 2016-02-04 | Discharge: 2016-02-04 | Disposition: A | Discharge status: Home / Self Care | Payer: Medicare Other | Source: Ambulatory Visit | Attending: Radiation Oncology | Admitting: Radiation Oncology

## 2016-02-04 DIAGNOSIS — C50412 Malignant neoplasm of upper-outer quadrant of left female breast: Secondary | ICD-10-CM | POA: Diagnosis not present

## 2016-02-05 ENCOUNTER — Ambulatory Visit
Admission: RE | Admit: 2016-02-05 | Discharge: 2016-02-05 | Disposition: A | Discharge status: Home / Self Care | Payer: Medicare Other | Source: Ambulatory Visit | Attending: Radiation Oncology | Admitting: Radiation Oncology

## 2016-02-05 DIAGNOSIS — C50412 Malignant neoplasm of upper-outer quadrant of left female breast: Secondary | ICD-10-CM | POA: Diagnosis not present

## 2016-02-08 ENCOUNTER — Ambulatory Visit: Payer: Medicare Other

## 2016-02-08 ENCOUNTER — Ambulatory Visit
Admission: RE | Admit: 2016-02-08 | Discharge: 2016-02-08 | Disposition: A | Discharge status: Home / Self Care | Payer: Medicare Other | Source: Ambulatory Visit | Attending: Radiation Oncology | Admitting: Radiation Oncology

## 2016-02-08 DIAGNOSIS — C50412 Malignant neoplasm of upper-outer quadrant of left female breast: Secondary | ICD-10-CM | POA: Diagnosis not present

## 2016-02-09 ENCOUNTER — Ambulatory Visit
Admission: RE | Admit: 2016-02-09 | Discharge: 2016-02-09 | Disposition: A | Discharge status: Home / Self Care | Payer: Medicare Other | Source: Ambulatory Visit | Attending: Radiation Oncology | Admitting: Radiation Oncology

## 2016-02-09 DIAGNOSIS — C50412 Malignant neoplasm of upper-outer quadrant of left female breast: Secondary | ICD-10-CM | POA: Diagnosis not present

## 2016-02-10 ENCOUNTER — Ambulatory Visit
Admission: RE | Admit: 2016-02-10 | Discharge: 2016-02-10 | Disposition: A | Discharge status: Home / Self Care | Payer: Medicare Other | Source: Ambulatory Visit | Attending: Radiation Oncology | Admitting: Radiation Oncology

## 2016-02-10 DIAGNOSIS — C50412 Malignant neoplasm of upper-outer quadrant of left female breast: Secondary | ICD-10-CM | POA: Diagnosis not present

## 2016-02-11 ENCOUNTER — Ambulatory Visit
Admission: RE | Admit: 2016-02-11 | Discharge: 2016-02-11 | Disposition: A | Discharge status: Home / Self Care | Payer: Medicare Other | Source: Ambulatory Visit | Attending: Radiation Oncology | Admitting: Radiation Oncology

## 2016-02-11 DIAGNOSIS — C50412 Malignant neoplasm of upper-outer quadrant of left female breast: Secondary | ICD-10-CM | POA: Diagnosis not present

## 2016-02-12 ENCOUNTER — Ambulatory Visit
Admission: RE | Admit: 2016-02-12 | Discharge: 2016-02-12 | Disposition: A | Discharge status: Home / Self Care | Payer: Medicare Other | Source: Ambulatory Visit | Attending: Radiation Oncology | Admitting: Radiation Oncology

## 2016-02-12 DIAGNOSIS — C50412 Malignant neoplasm of upper-outer quadrant of left female breast: Secondary | ICD-10-CM | POA: Diagnosis not present

## 2016-02-15 ENCOUNTER — Ambulatory Visit
Admission: RE | Admit: 2016-02-15 | Discharge: 2016-02-15 | Disposition: A | Discharge status: Home / Self Care | Payer: Medicare Other | Source: Ambulatory Visit | Attending: Radiation Oncology | Admitting: Radiation Oncology

## 2016-02-15 DIAGNOSIS — C50412 Malignant neoplasm of upper-outer quadrant of left female breast: Secondary | ICD-10-CM | POA: Diagnosis not present

## 2016-02-16 ENCOUNTER — Ambulatory Visit
Admission: RE | Admit: 2016-02-16 | Discharge: 2016-02-16 | Disposition: A | Discharge status: Home / Self Care | Payer: Medicare Other | Source: Ambulatory Visit | Attending: Radiation Oncology | Admitting: Radiation Oncology

## 2016-02-16 DIAGNOSIS — C50412 Malignant neoplasm of upper-outer quadrant of left female breast: Secondary | ICD-10-CM | POA: Diagnosis not present

## 2016-02-17 ENCOUNTER — Ambulatory Visit
Admission: RE | Admit: 2016-02-17 | Discharge: 2016-02-17 | Disposition: A | Discharge status: Home / Self Care | Payer: Medicare Other | Source: Ambulatory Visit | Attending: Radiation Oncology | Admitting: Radiation Oncology

## 2016-02-17 DIAGNOSIS — C50412 Malignant neoplasm of upper-outer quadrant of left female breast: Secondary | ICD-10-CM | POA: Diagnosis not present

## 2016-02-18 ENCOUNTER — Ambulatory Visit
Admission: RE | Admit: 2016-02-18 | Discharge: 2016-02-18 | Disposition: A | Discharge status: Home / Self Care | Payer: Medicare Other | Source: Ambulatory Visit | Attending: Radiation Oncology | Admitting: Radiation Oncology

## 2016-02-18 DIAGNOSIS — C50412 Malignant neoplasm of upper-outer quadrant of left female breast: Secondary | ICD-10-CM | POA: Diagnosis not present

## 2016-02-19 ENCOUNTER — Ambulatory Visit: Payer: Medicare Other

## 2016-02-19 DIAGNOSIS — C50412 Malignant neoplasm of upper-outer quadrant of left female breast: Secondary | ICD-10-CM | POA: Diagnosis not present

## 2016-02-22 ENCOUNTER — Inpatient Hospital Stay: Payer: Medicare Other

## 2016-02-22 ENCOUNTER — Ambulatory Visit
Admission: RE | Admit: 2016-02-22 | Discharge: 2016-02-22 | Disposition: A | Discharge status: Home / Self Care | Payer: Medicare Other | Source: Ambulatory Visit | Attending: Radiation Oncology | Admitting: Radiation Oncology

## 2016-02-22 ENCOUNTER — Inpatient Hospital Stay (HOSPITAL_BASED_OUTPATIENT_CLINIC_OR_DEPARTMENT_OTHER): Payer: Medicare Other | Admitting: Oncology

## 2016-02-22 VITALS — BP 133/91 | HR 90 | Temp 97.6°F | Resp 18 | Wt 150.0 lb

## 2016-02-22 DIAGNOSIS — Z23 Encounter for immunization: Secondary | ICD-10-CM

## 2016-02-22 DIAGNOSIS — Z170 Estrogen receptor positive status [ER+]: Secondary | ICD-10-CM

## 2016-02-22 DIAGNOSIS — K449 Diaphragmatic hernia without obstruction or gangrene: Secondary | ICD-10-CM

## 2016-02-22 DIAGNOSIS — Z9221 Personal history of antineoplastic chemotherapy: Secondary | ICD-10-CM

## 2016-02-22 DIAGNOSIS — C50012 Malignant neoplasm of nipple and areola, left female breast: Secondary | ICD-10-CM

## 2016-02-22 DIAGNOSIS — D649 Anemia, unspecified: Secondary | ICD-10-CM

## 2016-02-22 DIAGNOSIS — C50412 Malignant neoplasm of upper-outer quadrant of left female breast: Secondary | ICD-10-CM | POA: Diagnosis not present

## 2016-02-22 DIAGNOSIS — Z85828 Personal history of other malignant neoplasm of skin: Secondary | ICD-10-CM

## 2016-02-22 DIAGNOSIS — Z803 Family history of malignant neoplasm of breast: Secondary | ICD-10-CM

## 2016-02-22 DIAGNOSIS — K219 Gastro-esophageal reflux disease without esophagitis: Secondary | ICD-10-CM

## 2016-02-22 DIAGNOSIS — Z79899 Other long term (current) drug therapy: Secondary | ICD-10-CM | POA: Diagnosis not present

## 2016-02-22 DIAGNOSIS — C50411 Malignant neoplasm of upper-outer quadrant of right female breast: Secondary | ICD-10-CM | POA: Diagnosis not present

## 2016-02-22 DIAGNOSIS — R918 Other nonspecific abnormal finding of lung field: Secondary | ICD-10-CM

## 2016-02-22 DIAGNOSIS — C50812 Malignant neoplasm of overlapping sites of left female breast: Secondary | ICD-10-CM | POA: Diagnosis not present

## 2016-02-22 DIAGNOSIS — R600 Localized edema: Secondary | ICD-10-CM

## 2016-02-22 DIAGNOSIS — G4730 Sleep apnea, unspecified: Secondary | ICD-10-CM

## 2016-02-22 DIAGNOSIS — C50912 Malignant neoplasm of unspecified site of left female breast: Secondary | ICD-10-CM

## 2016-02-22 DIAGNOSIS — Z8041 Family history of malignant neoplasm of ovary: Secondary | ICD-10-CM

## 2016-02-22 DIAGNOSIS — Z923 Personal history of irradiation: Secondary | ICD-10-CM

## 2016-02-22 DIAGNOSIS — L600 Ingrowing nail: Secondary | ICD-10-CM

## 2016-02-22 DIAGNOSIS — Z808 Family history of malignant neoplasm of other organs or systems: Secondary | ICD-10-CM

## 2016-02-22 LAB — COMPREHENSIVE METABOLIC PANEL
ALT: 21 U/L (ref 14–54)
AST: 26 U/L (ref 15–41)
Albumin: 3.9 g/dL (ref 3.5–5.0)
Alkaline Phosphatase: 53 U/L (ref 38–126)
Anion gap: 8 (ref 5–15)
BILIRUBIN TOTAL: 1.5 mg/dL — AB (ref 0.3–1.2)
BUN: 19 mg/dL (ref 6–20)
CALCIUM: 9.8 mg/dL (ref 8.9–10.3)
CHLORIDE: 107 mmol/L (ref 101–111)
CO2: 24 mmol/L (ref 22–32)
CREATININE: 0.67 mg/dL (ref 0.44–1.00)
Glucose, Bld: 146 mg/dL — ABNORMAL HIGH (ref 65–99)
Potassium: 4 mmol/L (ref 3.5–5.1)
Sodium: 139 mmol/L (ref 135–145)
TOTAL PROTEIN: 7 g/dL (ref 6.5–8.1)

## 2016-02-22 LAB — CBC WITH DIFFERENTIAL/PLATELET
Basophils Absolute: 0 10*3/uL (ref 0–0.1)
Basophils Relative: 1 %
EOS PCT: 2 %
Eosinophils Absolute: 0.1 10*3/uL (ref 0–0.7)
HCT: 42 % (ref 35.0–47.0)
Hemoglobin: 14.2 g/dL (ref 12.0–16.0)
LYMPHS ABS: 0.7 10*3/uL — AB (ref 1.0–3.6)
LYMPHS PCT: 19 %
MCH: 31.1 pg (ref 26.0–34.0)
MCHC: 33.9 g/dL (ref 32.0–36.0)
MCV: 91.9 fL (ref 80.0–100.0)
MONO ABS: 0.3 10*3/uL (ref 0.2–0.9)
Monocytes Relative: 9 %
Neutro Abs: 2.5 10*3/uL (ref 1.4–6.5)
Neutrophils Relative %: 69 %
PLATELETS: 178 10*3/uL (ref 150–440)
RBC: 4.57 MIL/uL (ref 3.80–5.20)
RDW: 14.5 % (ref 11.5–14.5)
WBC: 3.6 10*3/uL (ref 3.6–11.0)

## 2016-02-22 MED ORDER — SODIUM CHLORIDE 0.9 % IV SOLN
Freq: Once | INTRAVENOUS | Status: AC
  Administered 2016-02-22: 15:00:00 via INTRAVENOUS
  Filled 2016-02-22: qty 1000

## 2016-02-22 MED ORDER — INFLUENZA VAC SPLIT QUAD 0.5 ML IM SUSY
0.5000 mL | PREFILLED_SYRINGE | Freq: Once | INTRAMUSCULAR | Status: AC
  Administered 2016-02-22: 0.5 mL via INTRAMUSCULAR
  Filled 2016-02-22: qty 0.5

## 2016-02-22 MED ORDER — ACETAMINOPHEN 325 MG PO TABS
650.0000 mg | ORAL_TABLET | Freq: Once | ORAL | Status: AC
  Administered 2016-02-22: 650 mg via ORAL
  Filled 2016-02-22: qty 2

## 2016-02-22 MED ORDER — TRASTUZUMAB CHEMO INJECTION 440 MG: 6.0000 mg/kg | Freq: Once | INTRAVENOUS | Status: DC

## 2016-02-22 MED ORDER — HEPARIN SOD (PORK) LOCK FLUSH 100 UNIT/ML IV SOLN
INTRAVENOUS | Status: AC
  Filled 2016-02-22: qty 5

## 2016-02-22 MED ORDER — TRASTUZUMAB CHEMO 150 MG IV SOLR
6.0000 mg/kg | Freq: Once | INTRAVENOUS | Status: AC
  Administered 2016-02-22: 399 mg via INTRAVENOUS
  Filled 2016-02-22: qty 19

## 2016-02-22 MED ORDER — HEPARIN SOD (PORK) LOCK FLUSH 100 UNIT/ML IV SOLN
500.0000 [IU] | Freq: Once | INTRAVENOUS | Status: AC
  Administered 2016-02-22: 500 [IU] via INTRAVENOUS

## 2016-02-22 MED ORDER — DIPHENHYDRAMINE HCL 25 MG PO CAPS
25.0000 mg | ORAL_CAPSULE | Freq: Once | ORAL | Status: AC
  Administered 2016-02-22: 25 mg via ORAL
  Filled 2016-02-22: qty 1

## 2016-02-22 MED ORDER — SODIUM CHLORIDE 0.9 % IJ SOLN
10.0000 mL | Freq: Once | INTRAMUSCULAR | Status: AC
  Administered 2016-02-22: 10 mL via INTRAVENOUS
  Filled 2016-02-22: qty 10

## 2016-02-22 NOTE — Progress Notes (Signed)
_ [place]  Telephone:([phone] [phone]  ID: Stephanie Crosby OB: 05-Jul-1935  MR#: [id number]  DJT#:[id number]  [name], MD as PCP - General (Family Medicine) [name], MD as Consulting Physician (General Surgery)  CHIEF COMPLAINT:   1. Clinical stage IIb ER positive, PR negative, HER-2 overexpressing invasive lobular carcinoma overlapping sites of the left breast, now pathologic stage Ia (T1c,N0,M0). 2. Clinical stage Ia ER/PR positive, HER-2 negative invasive ductal carcinoma of the upper outer quadrant of right breast, now pathologic stage 0.  INTERVAL HISTORY: Patient returns to clinic today for further evaluation and consideration of cycle 12 of Herceptin only. She has now completed her XRT. She has had problems with ingrown toenails and is currently being evaluated by podiatry. She otherwise feels well and is asymptomatic. She does not complain of thickened watery eye drainage today. She has no neurologic complaints. She denies any fevers. She denies any pain. She has no chest pain or shortness of breath. She denies any nausea, vomiting, constipation or diarrhea. She has no urinary complaints. Patient offers no further specific complaints today.  REVIEW OF SYSTEMS:   Review of Systems  Constitutional: Negative for fever, malaise/fatigue and weight loss.  HENT: Negative for sore throat.   Eyes: Negative for discharge and redness.  Respiratory: Negative.  Negative for shortness of breath.   Cardiovascular: Negative.  Negative for chest pain and leg swelling.  Gastrointestinal: Negative.  Negative for blood in stool, diarrhea and melena.  Musculoskeletal: Negative.   Skin: Negative.   Neurological: Negative.  Negative for weakness.  Psychiatric/Behavioral: Negative.  The patient is not nervous/anxious.     As per HPI. Otherwise, a complete review of systems is negative.  PAST MEDICAL HISTORY: Past Medical  History:  Diagnosis Date  . Anemia   . Back injury   . Bilateral breast cancer ([place]) 05/2015   Chemo tx's.  . Breast cancer in situ 05/2015   Left  . Edema of leg   . GERD (gastroesophageal reflux disease)   . Gonalgia   . Hiatal hernia   . Seasonal allergic rhinitis   . Sleep apnea   . Squamous cell skin cancer 2011   resected from left side of nose and Right axilla area.   . Tearing eyes    SINCE TAKING CHEMO    PAST SURGICAL HISTORY: Past Surgical History:  Procedure Laterality Date  . APPENDECTOMY    . AXILLARY SENTINEL NODE BIOPSY Bilateral 12/10/2015   Procedure: BILATERAL SENTINEL NODE BIOPSY, SENTINNEL NODE INJ.;  Surgeon: [name], MD;  Location: [facility]S;  Service: General;  Laterality: Bilateral;  . BREAST BIOPSY Bilateral 11/17/2015   Procedure: BREAST BIOPSY WITH NEEDLE LOCALIZATION;  Surgeon: [name], MD;  Location: [facility] OR[facility];  Service: General;  Laterality: Bilateral;  . CATARACT EXTRACTION, BILATERAL Bilateral 2012  . COLONOSCOPY    . PORTACATH PLACEMENT Right 06/10/2015   Procedure: INSERTION PORT-A-CATH;  Surgeon: [name], MD;  Location: [facility] ORS;  Service: General;  Laterality: Right;    FAMILY HISTORY: Patient reports a maternal aunt with breast cancer as well as a female cousin with breast cancer.  Family History  Problem Relation Age of Onset  . Dementia Mother   . Hyperlipidemia Mother   . Hypertension Mother   . Congestive Heart Failure Mother   . Heart disease Mother   . Aneurysm Father     abdominal  . Fibromyalgia Sister   . Heart disease Sister   .  Cancer Maternal Uncle     brain  . Heart disease Maternal Uncle   . Ovarian cancer Maternal Grandmother   . Cancer Cousin     breast cancer  . Breast cancer Neg Hx        ADVANCED DIRECTIVES:    HEALTH MAINTENANCE: Social History  Substance Use Topics  . Smoking status: Never Smoker  . Smokeless tobacco: Never Used  . Alcohol use 0.0 oz/week     Comment: 3 glasses  wine/week      Allergies  Allergen Reactions  . Penicillins Rash    Has patient had a PCN reaction causing immediate rash, facial/tongue/throat swelling, SOB or lightheadedness with hypotension: unsure Has patient had a PCN reaction causing severe rash involving mucus membranes or skin necrosis: no Has patient had a PCN reaction that required hospitalization no Has patient had a PCN reaction occurring within the last 10 years: no If all of the above answers are "NO", then may proceed with Cephalosporin use.    Current Outpatient Prescriptions  Medication Sig Dispense Refill  . acetaminophen (TYLENOL) 500 MG tablet Take 500 mg by mouth every 6 (six) hours as needed for mild pain.    . diphenhydrAMINE (SOMINEX) 25 MG tablet Take 25 mg by mouth every other day. ALTERNATES WITH CLARITIN    . gabapentin (NEURONTIN) 300 MG capsule Take 1 capsule (300 mg total) by mouth at bedtime. 30 capsule 2  . lidocaine-prilocaine (EMLA) cream Apply 1 application topically as needed (for chemo therapy port access).     Marland Kitchen loratadine (CLARITIN) 10 MG tablet Take 10 mg by mouth every other day. ALTERNATES WITH BENADRYL    . Multiple Vitamins-Minerals (CENTRUM SILVER PO) Take 1 tablet by mouth daily. Reported on 06/15/2015    . omeprazole (PRILOSEC) 20 MG capsule Take 1 capsule by mouth every morning. Reported on 06/15/2015    . traMADol (ULTRAM) 50 MG tablet Take 1 tablet (50 mg total) by mouth every 8 (eight) hours as needed. (Patient taking differently: Take 50 mg by mouth every 8 (eight) hours as needed for moderate pain. ) 20 tablet 0  . valACYclovir (VALTREX) 500 MG tablet Take 500 mg by mouth daily as needed (break outs). Reported on 06/15/2015     No current facility-administered medications for this visit.    Facility-Administered Medications Ordered in Other Visits  Medication Dose Route Frequency Provider Last Rate Last Dose  . sodium chloride flush (NS) 0.9 % injection 10 mL  10 mL Intracatheter PRN  Lloyd Huger, MD        OBJECTIVE: Vitals:   02/22/16 1350  BP: (!) 133/91  Pulse: 90  Resp: 18  Temp: 97.6 F (36.4 C)     Body mass index is 23.5 kg/m.    ECOG FS:0 - Asymptomatic  General: Well-developed, well-nourished, no acute distress. Eyes: Pink conjunctiva, anicteric sclera. Breasts: Exam deferred today. Lungs: Clear to auscultation bilaterally. Heart: Regular rate and rhythm. No rubs, murmurs, or gallops. Abdomen: Soft, nontender, nondistended. No organomegaly noted, normoactive bowel sounds. Musculoskeletal: No edema, cyanosis, or clubbing. Neuro: Alert, answering all questions appropriately. Cranial nerves grossly intact. Skin: ecchymosis right breast. Psych: Normal affect.   LAB RESULTS:  Lab Results  Component Value Date   NA 139 02/22/2016   K 4.0 02/22/2016   CL 107 02/22/2016   CO2 24 02/22/2016   GLUCOSE 146 (H) 02/22/2016   BUN 19 02/22/2016   CREATININE 0.67 02/22/2016   CALCIUM 9.8 02/22/2016   PROT 7.0  02/22/2016   ALBUMIN 3.9 02/22/2016   AST 26 02/22/2016   ALT 21 02/22/2016   ALKPHOS 53 02/22/2016   BILITOT 1.5 (H) 02/22/2016   GFRNONAA >60 02/22/2016   GFRAA >60 02/22/2016    Lab Results  Component Value Date   WBC 3.6 02/22/2016   NEUTROABS 2.5 02/22/2016   HGB 14.2 02/22/2016   HCT 42.0 02/22/2016   MCV 91.9 02/22/2016   PLT 178 02/22/2016   Lab Results  Component Value Date   IRON 10 (L) 09/15/2015   TIBC 395 09/15/2015   IRONPCTSAT 3 (L) 09/15/2015   Lab Results  Component Value Date   FERRITIN 14 09/15/2015     STUDIES: No results found.  ASSESSMENT:   1. Clinical stage IIb ER positive, PR negative, HER-2 overexpressing invasive lobular carcinoma overlapping sites of the left breast, now pathologic stage Ia (T1c,N0,M0). 2. Clinical stage Ia ER/PR positive, HER-2 negative invasive ductal carcinoma of the upper outer quadrant of right breast, now pathologic stage 0. 3. BRCA 1 and 2 negative.  PLAN:     1. Clinical stage IIb ER positive, PR negative, HER-2 overexpressing invasive lobular carcinoma overlapping sites of the left breast, now pathologic stage Ia (T1c,N0,M0): Patient noted to have residual stage IA tumor and her left breast with a complete pathologic response in her right breast. Patient has now completed 6 cycles of chemotherapy, but will continue with adjuvant Herceptin only. Proceed with cycle 12 of 18 today. MUGA scan from November 23, 2015 reported an EF of 63%, repeat after next clinic visit. Now that patient has completed her XRT, have recommended initiating an aromatase inhibitor. Patient declined at this time, but agreed to readdress at next clinic visit.  Return to clinic in 3 weeks for consideration of cycle 13 of Herceptin only. 2. Clinical stage Ia ER/PR positive, HER-2 negative invasive ductal carcinoma of the upper outer quadrant of right breast, now pathologic stage 0: Complete pathologic response. Patient has completed XRT. Aromatase inhibitor as above. 3. Pulmonary nodule: CT from December 09, 2015 results reviewed independently with pulmonary lesion that is unchanged. No intervention is needed. Consider repeat CT scan in 6 months.   4. Anemia: Patient's hemoglobin is now within normal limits. She received IV iron in June 2017. Repeat iron stores at next clinic visit. Monitor. 5. Eye drainage: Possible allergies. Continue OTC medications as directed.  6. Acid Reflux: Continue omeprazole. 7. Lower extremity edema:  MUGA scan as above.  Patient expressed understanding and was in agreement with this plan. She also understands that She can call clinic at any time with any questions, concerns, or complaints.    Lloyd Huger, MD   02/27/2016 9:35 AM

## 2016-02-22 NOTE — Progress Notes (Signed)
States continues to have neuropathy in hands and feet that feels like is getting slightly worse. Has sensitivity around toenails. Has appt scheduled with podiatry to help with pain around toenails.

## 2016-02-23 ENCOUNTER — Ambulatory Visit
Admission: RE | Admit: 2016-02-23 | Discharge: 2016-02-23 | Disposition: A | Discharge status: Home / Self Care | Payer: Medicare Other | Source: Ambulatory Visit | Attending: Radiation Oncology | Admitting: Radiation Oncology

## 2016-02-23 DIAGNOSIS — C50412 Malignant neoplasm of upper-outer quadrant of left female breast: Secondary | ICD-10-CM | POA: Diagnosis not present

## 2016-02-24 ENCOUNTER — Ambulatory Visit
Admission: RE | Admit: 2016-02-24 | Discharge: 2016-02-24 | Disposition: A | Discharge status: Home / Self Care | Payer: Medicare Other | Source: Ambulatory Visit | Attending: Radiation Oncology | Admitting: Radiation Oncology

## 2016-02-24 DIAGNOSIS — C50412 Malignant neoplasm of upper-outer quadrant of left female breast: Secondary | ICD-10-CM | POA: Diagnosis not present

## 2016-02-25 ENCOUNTER — Ambulatory Visit
Admission: RE | Admit: 2016-02-25 | Discharge: 2016-02-25 | Disposition: A | Discharge status: Home / Self Care | Payer: Medicare Other | Source: Ambulatory Visit | Attending: Radiation Oncology | Admitting: Radiation Oncology

## 2016-02-25 DIAGNOSIS — C50412 Malignant neoplasm of upper-outer quadrant of left female breast: Secondary | ICD-10-CM | POA: Diagnosis not present

## 2016-02-26 ENCOUNTER — Ambulatory Visit
Admission: RE | Admit: 2016-02-26 | Discharge: 2016-02-26 | Disposition: A | Discharge status: Home / Self Care | Payer: Medicare Other | Source: Ambulatory Visit | Attending: Radiation Oncology | Admitting: Radiation Oncology

## 2016-02-26 DIAGNOSIS — C50412 Malignant neoplasm of upper-outer quadrant of left female breast: Secondary | ICD-10-CM | POA: Diagnosis not present

## 2016-02-29 ENCOUNTER — Ambulatory Visit
Admission: RE | Admit: 2016-02-29 | Discharge: 2016-02-29 | Disposition: A | Discharge status: Home / Self Care | Payer: Medicare Other | Source: Ambulatory Visit | Attending: Radiation Oncology | Admitting: Radiation Oncology

## 2016-02-29 DIAGNOSIS — C50412 Malignant neoplasm of upper-outer quadrant of left female breast: Secondary | ICD-10-CM | POA: Diagnosis not present

## 2016-03-01 ENCOUNTER — Ambulatory Visit: Payer: Medicare Other

## 2016-03-01 ENCOUNTER — Ambulatory Visit
Admission: RE | Admit: 2016-03-01 | Discharge: 2016-03-01 | Disposition: A | Discharge status: Home / Self Care | Payer: Medicare Other | Source: Ambulatory Visit | Attending: Radiation Oncology | Admitting: Radiation Oncology

## 2016-03-01 DIAGNOSIS — C50412 Malignant neoplasm of upper-outer quadrant of left female breast: Secondary | ICD-10-CM | POA: Diagnosis not present

## 2016-03-02 ENCOUNTER — Ambulatory Visit
Admission: RE | Admit: 2016-03-02 | Discharge: 2016-03-02 | Disposition: A | Discharge status: Home / Self Care | Payer: Medicare Other | Source: Ambulatory Visit | Attending: Radiation Oncology | Admitting: Radiation Oncology

## 2016-03-02 DIAGNOSIS — C50412 Malignant neoplasm of upper-outer quadrant of left female breast: Secondary | ICD-10-CM | POA: Diagnosis not present

## 2016-03-13 NOTE — Progress Notes (Signed)
_ [place]  Telephone:([phone] [phone]  ID: Stephanie Crosby OB: 1935-10-21  MR#: [id number]  UYQ#:[id number]  [name], MD as PCP - General (Family Medicine) [name], MD as Consulting Physician (General Surgery)  CHIEF COMPLAINT:   1. Clinical stage IIb ER positive, PR negative, HER-2 overexpressing invasive lobular carcinoma overlapping sites of the left breast, now pathologic stage Ia (T1c,N0,M0). 2. Clinical stage Ia ER/PR positive, HER-2 negative invasive ductal carcinoma of the upper outer quadrant of right breast, now pathologic stage 0.  INTERVAL HISTORY: Patient returns to clinic today for further evaluation and consideration of cycle 13 of Herceptin only. She has now completed her XRT. She has had problems with ingrown toenails and is currently being evaluated by podiatry. She otherwise feels well and is asymptomatic. She does not complain of thickened watery eye drainage today. She has no neurologic complaints. She denies any fevers. She denies any pain. She has no chest pain or shortness of breath. She denies any nausea, vomiting, constipation or diarrhea. She has no urinary complaints. Patient offers no further specific complaints today.  REVIEW OF SYSTEMS:   Review of Systems  Constitutional: Negative for fever, malaise/fatigue and weight loss.  HENT: Negative for sore throat.   Eyes: Negative for discharge and redness.  Respiratory: Negative.  Negative for shortness of breath.   Cardiovascular: Negative.  Negative for chest pain and leg swelling.  Gastrointestinal: Negative.  Negative for blood in stool, diarrhea and melena.  Musculoskeletal: Negative.   Skin: Negative.   Neurological: Negative.  Negative for weakness.  Psychiatric/Behavioral: Negative.  The patient is not nervous/anxious.     As per HPI. Otherwise, a complete review of systems is negative.  PAST MEDICAL HISTORY: Past Medical  History:  Diagnosis Date  . Anemia   . Back injury   . Bilateral breast cancer ([place]) 05/2015   Chemo tx's.  . Breast cancer in situ 05/2015   Left  . Edema of leg   . GERD (gastroesophageal reflux disease)   . Gonalgia   . Hiatal hernia   . Seasonal allergic rhinitis   . Sleep apnea   . Squamous cell skin cancer 2011   resected from left side of nose and Right axilla area.   . Tearing eyes    SINCE TAKING CHEMO    PAST SURGICAL HISTORY: Past Surgical History:  Procedure Laterality Date  . APPENDECTOMY    . AXILLARY SENTINEL NODE BIOPSY Bilateral 12/10/2015   Procedure: BILATERAL SENTINEL NODE BIOPSY, SENTINNEL NODE INJ.;  Surgeon: [name], MD;  Location: [facility];  Service: General;  Laterality: Bilateral;  . BREAST BIOPSY Bilateral 11/17/2015   Procedure: BREAST BIOPSY WITH NEEDLE LOCALIZATION;  Surgeon: [name], MD;  Location: [facility];  Service: General;  Laterality: Bilateral;  . CATARACT EXTRACTION, BILATERAL Bilateral 2012  . COLONOSCOPY    . PORTACATH PLACEMENT Right 06/10/2015   Procedure: INSERTION PORT-A-CATH;  Surgeon: [name], MD;  Location: [facility];  Service: General;  Laterality: Right;    FAMILY HISTORY: Patient reports a maternal aunt with breast cancer as well as a female cousin with breast cancer.  Family History  Problem Relation Age of Onset  . Dementia Mother   . Hyperlipidemia Mother   . Hypertension Mother   . Congestive Heart Failure Mother   . Heart disease Mother   . Aneurysm Father     abdominal  . Fibromyalgia Sister   . Heart disease Sister   .  Cancer Maternal Uncle     brain  . Heart disease Maternal Uncle   . Ovarian cancer Maternal Grandmother   . Cancer Cousin     breast cancer  . Breast cancer Neg Hx        ADVANCED DIRECTIVES:    HEALTH MAINTENANCE: Social History  Substance Use Topics  . Smoking status: Never Smoker  . Smokeless tobacco: Never Used  . Alcohol use 0.0 oz/week     Comment: 3 glasses  wine/week      Allergies  Allergen Reactions  . Penicillins Rash    Has patient had a PCN reaction causing immediate rash, facial/tongue/throat swelling, SOB or lightheadedness with hypotension: unsure Has patient had a PCN reaction causing severe rash involving mucus membranes or skin necrosis: no Has patient had a PCN reaction that required hospitalization no Has patient had a PCN reaction occurring within the last 10 years: no If all of the above answers are "NO", then may proceed with Cephalosporin use.    Current Outpatient Prescriptions  Medication Sig Dispense Refill  . acetaminophen (TYLENOL) 500 MG tablet Take 500 mg by mouth every 6 (six) hours as needed for mild pain.    . diphenhydrAMINE (SOMINEX) 25 MG tablet Take 25 mg by mouth every other day. ALTERNATES WITH CLARITIN    . gabapentin (NEURONTIN) 300 MG capsule Take 1 capsule (300 mg total) by mouth at bedtime. 30 capsule 2  . lidocaine-prilocaine (EMLA) cream Apply 1 application topically as needed (for chemo therapy port access).     Marland Kitchen loratadine (CLARITIN) 10 MG tablet Take 10 mg by mouth every other day. ALTERNATES WITH BENADRYL    . Multiple Vitamins-Minerals (CENTRUM SILVER PO) Take 1 tablet by mouth daily. Reported on 06/15/2015    . omeprazole (PRILOSEC) 20 MG capsule Take 1 capsule by mouth every morning. Reported on 06/15/2015    . traMADol (ULTRAM) 50 MG tablet Take 1 tablet (50 mg total) by mouth every 8 (eight) hours as needed. (Patient taking differently: Take 50 mg by mouth every 8 (eight) hours as needed for moderate pain. ) 20 tablet 0  . valACYclovir (VALTREX) 500 MG tablet Take 500 mg by mouth daily as needed (break outs). Reported on 06/15/2015     No current facility-administered medications for this visit.    Facility-Administered Medications Ordered in Other Visits  Medication Dose Route Frequency Provider Last Rate Last Dose  . heparin lock flush 100 unit/mL  500 Units Intravenous Once Lloyd Huger, MD      . sodium chloride flush (NS) 0.9 % injection 10 mL  10 mL Intracatheter PRN Lloyd Huger, MD        OBJECTIVE: Vitals:   03/14/16 1400  BP: (!) 146/95  Pulse: 83  Resp: 18  Temp: 98.1 F (36.7 C)     Body mass index is 23.96 kg/m.    ECOG FS:0 - Asymptomatic  General: Well-developed, well-nourished, no acute distress. Eyes: Pink conjunctiva, anicteric sclera. Breasts: Exam deferred today. Lungs: Clear to auscultation bilaterally. Heart: Regular rate and rhythm. No rubs, murmurs, or gallops. Abdomen: Soft, nontender, nondistended. No organomegaly noted, normoactive bowel sounds. Musculoskeletal: No edema, cyanosis, or clubbing. Neuro: Alert, answering all questions appropriately. Cranial nerves grossly intact. Skin: ecchymosis right breast. Psych: Normal affect.   LAB RESULTS:  Lab Results  Component Value Date   NA 138 03/14/2016   K 3.9 03/14/2016   CL 107 03/14/2016   CO2 24 03/14/2016   GLUCOSE 103 (H)  03/14/2016   BUN 17 03/14/2016   CREATININE 0.55 03/14/2016   CALCIUM 9.5 03/14/2016   PROT 6.7 03/14/2016   ALBUMIN 3.7 03/14/2016   AST 22 03/14/2016   ALT 19 03/14/2016   ALKPHOS 50 03/14/2016   BILITOT 1.1 03/14/2016   GFRNONAA >60 03/14/2016   GFRAA >60 03/14/2016    Lab Results  Component Value Date   WBC 3.3 (L) 03/14/2016   NEUTROABS 2.1 03/14/2016   HGB 13.6 03/14/2016   HCT 40.3 03/14/2016   MCV 94.0 03/14/2016   PLT 171 03/14/2016   Lab Results  Component Value Date   IRON 10 (L) 09/15/2015   TIBC 395 09/15/2015   IRONPCTSAT 3 (L) 09/15/2015   Lab Results  Component Value Date   FERRITIN 14 09/15/2015     STUDIES: No results found.  ASSESSMENT:   1. Clinical stage IIb ER positive, PR negative, HER-2 overexpressing invasive lobular carcinoma overlapping sites of the left breast, now pathologic stage Ia (T1c,N0,M0). 2. Clinical stage Ia ER/PR positive, HER-2 negative invasive ductal carcinoma of the upper  outer quadrant of right breast, now pathologic stage 0. 3. BRCA 1 and 2 negative.  PLAN:    1. Clinical stage IIb ER positive, PR negative, HER-2 overexpressing invasive lobular carcinoma overlapping sites of the left breast, now pathologic stage Ia (T1c,N0,M0): Patient noted to have residual stage IA tumor and her left breast with a complete pathologic response in her right breast. Patient has now completed 6 cycles of chemotherapy, but will continue with adjuvant Herceptin only. Proceed with cycle 13 of 18 today. MUGA scan from November 23, 2015 reported an EF of 63%, repeat in the next 1-2 weeks. Now that patient has completed her XRT, have recommended initiating an aromatase inhibitor. Continue letrozole completing 5 years of treatment in November 2022. We will get a baseline bone mineral density in the next 1-2 weeks. Return to clinic in 3 weeks for consideration of cycle 14 of Herceptin only. 2. Clinical stage Ia ER/PR positive, HER-2 negative invasive ductal carcinoma of the upper outer quadrant of right breast, now pathologic stage 0: Complete pathologic response. Patient has completed XRT. Letrozole as above. 3. Pulmonary nodule: CT from December 09, 2015 results reviewed independently with pulmonary lesion that is unchanged. No intervention is needed. Consider repeat CT scan in 6 months.   4. Anemia: Patient's hemoglobin is now within normal limits. She received IV iron in June 2017.  5. Eye drainage: Possible allergies. Continue OTC medications as directed.  6. Acid Reflux: Continue omeprazole. 7. Lower extremity edema:  MUGA scan as above.  Patient expressed understanding and was in agreement with this plan. She also understands that She can call clinic at any time with any questions, concerns, or complaints.    Lloyd Huger, MD   03/14/2016 2:50 PM

## 2016-03-14 ENCOUNTER — Inpatient Hospital Stay: Payer: Medicare Other

## 2016-03-14 ENCOUNTER — Inpatient Hospital Stay: Payer: Medicare Other | Attending: Oncology | Admitting: Oncology

## 2016-03-14 ENCOUNTER — Other Ambulatory Visit: Payer: Self-pay | Admitting: Oncology

## 2016-03-14 VITALS — BP 146/95 | HR 83 | Temp 98.1°F | Resp 18 | Wt 153.0 lb

## 2016-03-14 DIAGNOSIS — Z862 Personal history of diseases of the blood and blood-forming organs and certain disorders involving the immune mechanism: Secondary | ICD-10-CM | POA: Insufficient documentation

## 2016-03-14 DIAGNOSIS — Z9221 Personal history of antineoplastic chemotherapy: Secondary | ICD-10-CM | POA: Insufficient documentation

## 2016-03-14 DIAGNOSIS — Z5112 Encounter for antineoplastic immunotherapy: Secondary | ICD-10-CM | POA: Diagnosis not present

## 2016-03-14 DIAGNOSIS — Z85828 Personal history of other malignant neoplasm of skin: Secondary | ICD-10-CM | POA: Diagnosis not present

## 2016-03-14 DIAGNOSIS — Z808 Family history of malignant neoplasm of other organs or systems: Secondary | ICD-10-CM | POA: Insufficient documentation

## 2016-03-14 DIAGNOSIS — C50012 Malignant neoplasm of nipple and areola, left female breast: Secondary | ICD-10-CM

## 2016-03-14 DIAGNOSIS — K449 Diaphragmatic hernia without obstruction or gangrene: Secondary | ICD-10-CM | POA: Insufficient documentation

## 2016-03-14 DIAGNOSIS — R609 Edema, unspecified: Secondary | ICD-10-CM | POA: Diagnosis not present

## 2016-03-14 DIAGNOSIS — Z803 Family history of malignant neoplasm of breast: Secondary | ICD-10-CM | POA: Diagnosis not present

## 2016-03-14 DIAGNOSIS — L600 Ingrowing nail: Secondary | ICD-10-CM | POA: Diagnosis not present

## 2016-03-14 DIAGNOSIS — C50411 Malignant neoplasm of upper-outer quadrant of right female breast: Secondary | ICD-10-CM | POA: Diagnosis not present

## 2016-03-14 DIAGNOSIS — Z79899 Other long term (current) drug therapy: Secondary | ICD-10-CM | POA: Insufficient documentation

## 2016-03-14 DIAGNOSIS — Z923 Personal history of irradiation: Secondary | ICD-10-CM | POA: Insufficient documentation

## 2016-03-14 DIAGNOSIS — C50812 Malignant neoplasm of overlapping sites of left female breast: Secondary | ICD-10-CM | POA: Diagnosis present

## 2016-03-14 DIAGNOSIS — C50912 Malignant neoplasm of unspecified site of left female breast: Secondary | ICD-10-CM

## 2016-03-14 DIAGNOSIS — Z170 Estrogen receptor positive status [ER+]: Secondary | ICD-10-CM

## 2016-03-14 DIAGNOSIS — D508 Other iron deficiency anemias: Secondary | ICD-10-CM

## 2016-03-14 DIAGNOSIS — K219 Gastro-esophageal reflux disease without esophagitis: Secondary | ICD-10-CM | POA: Insufficient documentation

## 2016-03-14 DIAGNOSIS — Z79811 Long term (current) use of aromatase inhibitors: Secondary | ICD-10-CM

## 2016-03-14 DIAGNOSIS — Z8041 Family history of malignant neoplasm of ovary: Secondary | ICD-10-CM | POA: Diagnosis not present

## 2016-03-14 DIAGNOSIS — D509 Iron deficiency anemia, unspecified: Secondary | ICD-10-CM

## 2016-03-14 DIAGNOSIS — R911 Solitary pulmonary nodule: Secondary | ICD-10-CM | POA: Diagnosis not present

## 2016-03-14 DIAGNOSIS — G4730 Sleep apnea, unspecified: Secondary | ICD-10-CM | POA: Diagnosis not present

## 2016-03-14 LAB — COMPREHENSIVE METABOLIC PANEL
ALK PHOS: 50 U/L (ref 38–126)
ALT: 19 U/L (ref 14–54)
ANION GAP: 7 (ref 5–15)
AST: 22 U/L (ref 15–41)
Albumin: 3.7 g/dL (ref 3.5–5.0)
BUN: 17 mg/dL (ref 6–20)
CALCIUM: 9.5 mg/dL (ref 8.9–10.3)
CHLORIDE: 107 mmol/L (ref 101–111)
CO2: 24 mmol/L (ref 22–32)
CREATININE: 0.55 mg/dL (ref 0.44–1.00)
Glucose, Bld: 103 mg/dL — ABNORMAL HIGH (ref 65–99)
Potassium: 3.9 mmol/L (ref 3.5–5.1)
Sodium: 138 mmol/L (ref 135–145)
Total Bilirubin: 1.1 mg/dL (ref 0.3–1.2)
Total Protein: 6.7 g/dL (ref 6.5–8.1)

## 2016-03-14 LAB — IRON AND TIBC
IRON: 122 ug/dL (ref 28–170)
Saturation Ratios: 41 % — ABNORMAL HIGH (ref 10.4–31.8)
TIBC: 298 ug/dL (ref 250–450)
UIBC: 176 ug/dL

## 2016-03-14 LAB — CBC WITH DIFFERENTIAL/PLATELET
Basophils Absolute: 0 10*3/uL (ref 0–0.1)
Basophils Relative: 1 %
EOS PCT: 2 %
Eosinophils Absolute: 0.1 10*3/uL (ref 0–0.7)
HCT: 40.3 % (ref 35.0–47.0)
Hemoglobin: 13.6 g/dL (ref 12.0–16.0)
LYMPHS ABS: 0.8 10*3/uL — AB (ref 1.0–3.6)
LYMPHS PCT: 23 %
MCH: 31.7 pg (ref 26.0–34.0)
MCHC: 33.7 g/dL (ref 32.0–36.0)
MCV: 94 fL (ref 80.0–100.0)
MONOS PCT: 12 %
Monocytes Absolute: 0.4 10*3/uL (ref 0.2–0.9)
Neutro Abs: 2.1 10*3/uL (ref 1.4–6.5)
Neutrophils Relative %: 62 %
PLATELETS: 171 10*3/uL (ref 150–440)
RBC: 4.28 MIL/uL (ref 3.80–5.20)
RDW: 15.6 % — ABNORMAL HIGH (ref 11.5–14.5)
WBC: 3.3 10*3/uL — AB (ref 3.6–11.0)

## 2016-03-14 LAB — FERRITIN: FERRITIN: 61 ng/mL (ref 11–307)

## 2016-03-14 MED ORDER — SODIUM CHLORIDE 0.9 % IV SOLN
399.0000 mg | Freq: Once | INTRAVENOUS | Status: AC
  Administered 2016-03-14: 399 mg via INTRAVENOUS
  Filled 2016-03-14: qty 19

## 2016-03-14 MED ORDER — SODIUM CHLORIDE 0.9% FLUSH
10.0000 mL | Freq: Once | INTRAVENOUS | Status: AC
  Administered 2016-03-14: 10 mL via INTRAVENOUS
  Filled 2016-03-14: qty 10

## 2016-03-14 MED ORDER — DIPHENHYDRAMINE HCL 25 MG PO CAPS
25.0000 mg | ORAL_CAPSULE | Freq: Once | ORAL | Status: AC
  Administered 2016-03-14: 25 mg via ORAL
  Filled 2016-03-14: qty 1

## 2016-03-14 MED ORDER — HEPARIN SOD (PORK) LOCK FLUSH 100 UNIT/ML IV SOLN
500.0000 [IU] | Freq: Once | INTRAVENOUS | Status: AC
  Administered 2016-03-14: 500 [IU] via INTRAVENOUS
  Filled 2016-03-14: qty 5

## 2016-03-14 MED ORDER — ACETAMINOPHEN 325 MG PO TABS
650.0000 mg | ORAL_TABLET | Freq: Once | ORAL | Status: AC
  Administered 2016-03-14: 650 mg via ORAL
  Filled 2016-03-14: qty 2

## 2016-03-14 MED ORDER — SODIUM CHLORIDE 0.9 % IV SOLN
Freq: Once | INTRAVENOUS | Status: AC
  Administered 2016-03-14: 15:00:00 via INTRAVENOUS
  Filled 2016-03-14: qty 1000

## 2016-03-14 NOTE — Progress Notes (Signed)
Continues to have neuropathy in hands and feet. Saw podiatrist recently and toenails on both great toes were removed.

## 2016-03-28 ENCOUNTER — Ambulatory Visit
Admission: RE | Admit: 2016-03-28 | Discharge: 2016-03-28 | Disposition: A | Discharge status: Home / Self Care | Payer: Medicare Other | Source: Ambulatory Visit | Attending: Oncology | Admitting: Oncology

## 2016-03-28 DIAGNOSIS — C50411 Malignant neoplasm of upper-outer quadrant of right female breast: Secondary | ICD-10-CM | POA: Insufficient documentation

## 2016-03-28 DIAGNOSIS — Z170 Estrogen receptor positive status [ER+]: Secondary | ICD-10-CM | POA: Insufficient documentation

## 2016-03-28 MED ORDER — TECHNETIUM TC 99M-LABELED RED BLOOD CELLS IV KIT
22.0000 | PACK | Freq: Once | INTRAVENOUS | Status: AC | PRN
  Administered 2016-03-28: 21.207 via INTRAVENOUS

## 2016-04-04 ENCOUNTER — Inpatient Hospital Stay (HOSPITAL_BASED_OUTPATIENT_CLINIC_OR_DEPARTMENT_OTHER): Payer: Medicare Other | Admitting: Oncology

## 2016-04-04 ENCOUNTER — Inpatient Hospital Stay: Payer: Medicare Other

## 2016-04-04 ENCOUNTER — Inpatient Hospital Stay: Payer: Medicare Other | Attending: Internal Medicine

## 2016-04-04 VITALS — BP 147/84 | HR 78 | Temp 98.0°F | Resp 18

## 2016-04-04 VITALS — BP 142/86 | HR 84 | Temp 98.2°F | Ht 66.0 in | Wt 156.9 lb

## 2016-04-04 DIAGNOSIS — Z170 Estrogen receptor positive status [ER+]: Secondary | ICD-10-CM | POA: Diagnosis not present

## 2016-04-04 DIAGNOSIS — Z9221 Personal history of antineoplastic chemotherapy: Secondary | ICD-10-CM | POA: Insufficient documentation

## 2016-04-04 DIAGNOSIS — Z8041 Family history of malignant neoplasm of ovary: Secondary | ICD-10-CM

## 2016-04-04 DIAGNOSIS — C50812 Malignant neoplasm of overlapping sites of left female breast: Secondary | ICD-10-CM | POA: Diagnosis not present

## 2016-04-04 DIAGNOSIS — Z9049 Acquired absence of other specified parts of digestive tract: Secondary | ICD-10-CM | POA: Diagnosis not present

## 2016-04-04 DIAGNOSIS — Z923 Personal history of irradiation: Secondary | ICD-10-CM | POA: Insufficient documentation

## 2016-04-04 DIAGNOSIS — R5383 Other fatigue: Secondary | ICD-10-CM | POA: Diagnosis not present

## 2016-04-04 DIAGNOSIS — G629 Polyneuropathy, unspecified: Secondary | ICD-10-CM | POA: Insufficient documentation

## 2016-04-04 DIAGNOSIS — Z808 Family history of malignant neoplasm of other organs or systems: Secondary | ICD-10-CM | POA: Insufficient documentation

## 2016-04-04 DIAGNOSIS — D649 Anemia, unspecified: Secondary | ICD-10-CM | POA: Insufficient documentation

## 2016-04-04 DIAGNOSIS — R911 Solitary pulmonary nodule: Secondary | ICD-10-CM | POA: Insufficient documentation

## 2016-04-04 DIAGNOSIS — G4730 Sleep apnea, unspecified: Secondary | ICD-10-CM | POA: Diagnosis not present

## 2016-04-04 DIAGNOSIS — C50411 Malignant neoplasm of upper-outer quadrant of right female breast: Secondary | ICD-10-CM

## 2016-04-04 DIAGNOSIS — Z79899 Other long term (current) drug therapy: Secondary | ICD-10-CM

## 2016-04-04 DIAGNOSIS — G4700 Insomnia, unspecified: Secondary | ICD-10-CM | POA: Insufficient documentation

## 2016-04-04 DIAGNOSIS — Z85828 Personal history of other malignant neoplasm of skin: Secondary | ICD-10-CM | POA: Insufficient documentation

## 2016-04-04 DIAGNOSIS — K449 Diaphragmatic hernia without obstruction or gangrene: Secondary | ICD-10-CM | POA: Insufficient documentation

## 2016-04-04 DIAGNOSIS — C50912 Malignant neoplasm of unspecified site of left female breast: Secondary | ICD-10-CM

## 2016-04-04 DIAGNOSIS — R609 Edema, unspecified: Secondary | ICD-10-CM | POA: Diagnosis not present

## 2016-04-04 DIAGNOSIS — Z803 Family history of malignant neoplasm of breast: Secondary | ICD-10-CM

## 2016-04-04 DIAGNOSIS — Z5112 Encounter for antineoplastic immunotherapy: Secondary | ICD-10-CM | POA: Insufficient documentation

## 2016-04-04 DIAGNOSIS — K219 Gastro-esophageal reflux disease without esophagitis: Secondary | ICD-10-CM | POA: Insufficient documentation

## 2016-04-04 LAB — COMPREHENSIVE METABOLIC PANEL
ALBUMIN: 3.8 g/dL (ref 3.5–5.0)
ALK PHOS: 54 U/L (ref 38–126)
ALT: 21 U/L (ref 14–54)
AST: 24 U/L (ref 15–41)
Anion gap: 5 (ref 5–15)
BUN: 22 mg/dL — AB (ref 6–20)
CALCIUM: 9.5 mg/dL (ref 8.9–10.3)
CHLORIDE: 107 mmol/L (ref 101–111)
CO2: 26 mmol/L (ref 22–32)
CREATININE: 0.71 mg/dL (ref 0.44–1.00)
GFR calc Af Amer: >60 mL/min (ref >60)
GFR calc non Af Amer: >60 mL/min (ref >60)
GLUCOSE: 102 mg/dL — AB (ref 65–99)
Potassium: 4.4 mmol/L (ref 3.5–5.1)
SODIUM: 138 mmol/L (ref 135–145)
Total Bilirubin: 1.1 mg/dL (ref 0.3–1.2)
Total Protein: 6.5 g/dL (ref 6.5–8.1)

## 2016-04-04 LAB — CBC WITH DIFFERENTIAL/PLATELET
BASOS ABS: 0 10*3/uL (ref 0–0.1)
Basophils Relative: 1 %
EOS ABS: 0.1 10*3/uL (ref 0–0.7)
EOS PCT: 2 %
HCT: 40.1 % (ref 35.0–47.0)
HEMOGLOBIN: 13.6 g/dL (ref 12.0–16.0)
LYMPHS ABS: 1.1 10*3/uL (ref 1.0–3.6)
Lymphocytes Relative: 32 %
MCH: 32 pg (ref 26.0–34.0)
MCHC: 34 g/dL (ref 32.0–36.0)
MCV: 94.2 fL (ref 80.0–100.0)
Monocytes Absolute: 0.4 10*3/uL (ref 0.2–0.9)
Monocytes Relative: 12 %
NEUTROS PCT: 53 %
Neutro Abs: 1.8 10*3/uL (ref 1.4–6.5)
PLATELETS: 174 10*3/uL (ref 150–440)
RBC: 4.25 MIL/uL (ref 3.80–5.20)
RDW: 14.4 % (ref 11.5–14.5)
WBC: 3.4 10*3/uL — AB (ref 3.6–11.0)

## 2016-04-04 MED ORDER — ACETAMINOPHEN 325 MG PO TABS
650.0000 mg | ORAL_TABLET | Freq: Once | ORAL | Status: AC
  Administered 2016-04-04: 650 mg via ORAL
  Filled 2016-04-04: qty 2

## 2016-04-04 MED ORDER — SODIUM CHLORIDE 0.9% FLUSH
10.0000 mL | INTRAVENOUS | Status: DC | PRN
  Administered 2016-04-04: 10 mL
  Filled 2016-04-04: qty 10

## 2016-04-04 MED ORDER — SODIUM CHLORIDE 0.9 % IV SOLN
399.0000 mg | Freq: Once | INTRAVENOUS | Status: AC
  Administered 2016-04-04: 399 mg via INTRAVENOUS
  Filled 2016-04-04: qty 19

## 2016-04-04 MED ORDER — DIPHENHYDRAMINE HCL 25 MG PO CAPS
25.0000 mg | ORAL_CAPSULE | Freq: Once | ORAL | Status: AC
  Administered 2016-04-04: 25 mg via ORAL
  Filled 2016-04-04: qty 1

## 2016-04-04 MED ORDER — SODIUM CHLORIDE 0.9 % IV SOLN
Freq: Once | INTRAVENOUS | Status: AC
  Administered 2016-04-04: 15:00:00 via INTRAVENOUS
  Filled 2016-04-04: qty 1000

## 2016-04-04 MED ORDER — HEPARIN SOD (PORK) LOCK FLUSH 100 UNIT/ML IV SOLN
500.0000 [IU] | Freq: Once | INTRAVENOUS | Status: AC | PRN
  Administered 2016-04-04: 500 [IU]
  Filled 2016-04-04 (×2): qty 5

## 2016-04-04 NOTE — Progress Notes (Signed)
Patient here for follow up. Neuropathy still bothering her. She has had her two large toe nails removed.

## 2016-04-04 NOTE — Progress Notes (Signed)
_ [place]  Telephone:([phone] [phone]  ID: Stephanie Crosby OB: 1935-11-27  MR#: [id number]  AOZ#:[id number]  [name], MD as PCP - General (Family Medicine) [name], MD as Consulting Physician (General Surgery)  CHIEF COMPLAINT:   1. Clinical stage IIb ER positive, PR negative, HER-2 overexpressing invasive lobular carcinoma overlapping sites of the left breast, now pathologic stage Ia (T1c,N0,M0). 2. Clinical stage Ia ER/PR positive, HER-2 negative invasive ductal carcinoma of the upper outer quadrant of right breast, now pathologic stage 0.  INTERVAL HISTORY: Patient returns to clinic today for further evaluation and consideration of cycle 15 of Herceptin only. She has now completed her XRT. She feels well and is asymptomatic. She has no neurologic complaints. She denies any fevers. She denies any pain. She has no chest pain or shortness of breath. She denies any nausea, vomiting, constipation or diarrhea. She has no urinary complaints. Patient offers no further specific complaints today.  REVIEW OF SYSTEMS:   Review of Systems  Constitutional: Negative for fever, malaise/fatigue and weight loss.  HENT: Negative for sore throat.   Eyes: Negative for discharge and redness.  Respiratory: Negative.  Negative for shortness of breath.   Cardiovascular: Negative.  Negative for chest pain and leg swelling.  Gastrointestinal: Negative.  Negative for blood in stool, diarrhea and melena.  Musculoskeletal: Negative.   Skin: Negative.   Neurological: Positive for sensory change (Mild neuropathy ). Negative for weakness.  Psychiatric/Behavioral: Negative.  The patient is not nervous/anxious.     As per HPI. Otherwise, a complete review of systems is negative.  PAST MEDICAL HISTORY: Past Medical History:  Diagnosis Date  . Anemia   . Back injury   . Bilateral breast cancer ([place]) 05/2015   Chemo tx's.  . Breast cancer  in situ 05/2015   Left  . Edema of leg   . GERD (gastroesophageal reflux disease)   . Gonalgia   . Hiatal hernia   . Seasonal allergic rhinitis   . Sleep apnea   . Squamous cell skin cancer 2011   resected from left side of nose and Right axilla area.   . Tearing eyes    SINCE TAKING CHEMO    PAST SURGICAL HISTORY: Past Surgical History:  Procedure Laterality Date  . APPENDECTOMY    . AXILLARY SENTINEL NODE BIOPSY Bilateral 12/10/2015   Procedure: BILATERAL SENTINEL NODE BIOPSY, SENTINNEL NODE INJ.;  Surgeon: [name], MD;  Location: [facility] ORS;  Service: General;  Laterality: Bilateral;  . BREAST BIOPSY Bilateral 11/17/2015   Procedure: BREAST BIOPSY WITH NEEDLE LOCALIZATION;  Surgeon: [name], MD;  Location: [facility] ORS;  Service: General;  Laterality: Bilateral;  . CATARACT EXTRACTION, BILATERAL Bilateral 2012  . COLONOSCOPY    . PORTACATH PLACEMENT Right 06/10/2015   Procedure: INSERTION PORT-A-CATH;  Surgeon: [name], MD;  Location: [facility] ORS;  Service: General;  Laterality: Right;    FAMILY HISTORY: Patient reports a maternal aunt with breast cancer as well as a female cousin with breast cancer.  Family History  Problem Relation Age of Onset  . Dementia Mother   . Hyperlipidemia Mother   . Hypertension Mother   . Congestive Heart Failure Mother   . Heart disease Mother   . Aneurysm Father     abdominal  . Fibromyalgia Sister   . Heart disease Sister   . Cancer Maternal Uncle     brain  . Heart disease Maternal Uncle   . Ovarian cancer Maternal  Grandmother   . Cancer Cousin     breast cancer  . Breast cancer Neg Hx        ADVANCED DIRECTIVES:    HEALTH MAINTENANCE: Social History  Substance Use Topics  . Smoking status: Never Smoker  . Smokeless tobacco: Never Used  . Alcohol use 0.0 oz/week     Comment: 3 glasses wine/week      Allergies  Allergen Reactions  . Penicillins Rash    Has patient had a PCN reaction causing immediate rash,  facial/tongue/throat swelling, SOB or lightheadedness with hypotension: unsure Has patient had a PCN reaction causing severe rash involving mucus membranes or skin necrosis: no Has patient had a PCN reaction that required hospitalization no Has patient had a PCN reaction occurring within the last 10 years: no If all of the above answers are "NO", then may proceed with Cephalosporin use.    Current Outpatient Prescriptions  Medication Sig Dispense Refill  . acetaminophen (TYLENOL) 500 MG tablet Take 500 mg by mouth every 6 (six) hours as needed for mild pain.    . diphenhydrAMINE (SOMINEX) 25 MG tablet Take 25 mg by mouth every other day. ALTERNATES WITH CLARITIN    . lidocaine-prilocaine (EMLA) cream Apply 1 application topically as needed (for chemo therapy port access).     Marland Kitchen loratadine (CLARITIN) 10 MG tablet Take 10 mg by mouth every other day. ALTERNATES WITH BENADRYL    . Multiple Vitamins-Minerals (CENTRUM SILVER PO) Take 1 tablet by mouth daily. Reported on 06/15/2015    . omeprazole (PRILOSEC) 20 MG capsule Take 1 capsule by mouth every morning. Reported on 06/15/2015    . traMADol (ULTRAM) 50 MG tablet Take 1 tablet (50 mg total) by mouth every 8 (eight) hours as needed. (Patient taking differently: Take 50 mg by mouth every 8 (eight) hours as needed for moderate pain. ) 20 tablet 0  . valACYclovir (VALTREX) 500 MG tablet Take 500 mg by mouth daily as needed (break outs). Reported on 06/15/2015    . gabapentin (NEURONTIN) 300 MG capsule Take 1 capsule (300 mg total) by mouth at bedtime. (Patient not taking: Reported on 04/04/2016) 30 capsule 2   No current facility-administered medications for this visit.    Facility-Administered Medications Ordered in Other Visits  Medication Dose Route Frequency Provider Last Rate Last Dose  . sodium chloride flush (NS) 0.9 % injection 10 mL  10 mL Intracatheter PRN Lloyd Huger, MD        OBJECTIVE: Vitals:   04/04/16 1421  BP: (!) 142/86   Pulse: 84  Temp: 98.2 F (36.8 C)     Body mass index is 25.32 kg/m.    ECOG FS:0 - Asymptomatic  General: Well-developed, well-nourished, no acute distress. Eyes: Pink conjunctiva, anicteric sclera. Breasts: Exam deferred today. Lungs: Clear to auscultation bilaterally. Heart: Regular rate and rhythm. No rubs, murmurs, or gallops. Abdomen: Soft, nontender, nondistended. No organomegaly noted, normoactive bowel sounds. Musculoskeletal: No edema, cyanosis, or clubbing. Neuro: Alert, answering all questions appropriately. Cranial nerves grossly intact. Skin: ecchymosis right breast. Psych: Normal affect.   LAB RESULTS:  Lab Results  Component Value Date   NA 138 04/04/2016   K 4.4 04/04/2016   CL 107 04/04/2016   CO2 26 04/04/2016   GLUCOSE 102 (H) 04/04/2016   BUN 22 (H) 04/04/2016   CREATININE 0.71 04/04/2016   CALCIUM 9.5 04/04/2016   PROT 6.5 04/04/2016   ALBUMIN 3.8 04/04/2016   AST 24 04/04/2016   ALT 21 04/04/2016  ALKPHOS 54 04/04/2016   BILITOT 1.1 04/04/2016   GFRNONAA >60 04/04/2016   GFRAA >60 04/04/2016    Lab Results  Component Value Date   WBC 3.4 (L) 04/04/2016   NEUTROABS 1.8 04/04/2016   HGB 13.6 04/04/2016   HCT 40.1 04/04/2016   MCV 94.2 04/04/2016   PLT 174 04/04/2016   Lab Results  Component Value Date   IRON 122 03/14/2016   TIBC 298 03/14/2016   IRONPCTSAT 41 (H) 03/14/2016   Lab Results  Component Value Date   FERRITIN 61 03/14/2016     STUDIES: Nm Cardiac Muga Rest  Result Date: 03/28/2016 CLINICAL DATA:  Breast cancer. Evaluate cardiac function in relation to chemotherapy. EXAM: NUCLEAR MEDICINE CARDIAC BLOOD POOL IMAGING (MUGA) TECHNIQUE: Cardiac multi-gated acquisition was performed at rest following intravenous injection of Tc-58mlabeled red blood cells. RADIOPHARMACEUTICALS:  21.3 mCi Tc-928mDP in-vitro labeled red blood cells IV COMPARISON:  MUGA scan 11/23/2015, 08/14/2015 FINDINGS: No  focal wall motion abnormality  of the left ventricle. Calculated left ventricular ejection fraction equals 68 %. Most recent comparisons equal 63% (11/23/2015) IMPRESSION: Left ventricular ejection fraction equals 68 %. Electronically Signed   By: StSuzy Bouchard.D.   On: 03/28/2016 12:50    ASSESSMENT:   1. Clinical stage IIb ER positive, PR negative, HER-2 overexpressing invasive lobular carcinoma overlapping sites of the left breast, now pathologic stage Ia (T1c,N0,M0). 2. Clinical stage Ia ER/PR positive, HER-2 negative invasive ductal carcinoma of the upper outer quadrant of right breast, now pathologic stage 0. 3. BRCA 1 and 2 negative.  PLAN:    1. Clinical stage IIb ER positive, PR negative, HER-2 overexpressing invasive lobular carcinoma overlapping sites of the left breast, now pathologic stage Ia (T1c,N0,M0): Patient noted to have residual stage IA tumor and her left breast with a complete pathologic response in her right breast. Patient has now completed 6 cycles of chemotherapy, but will continue with adjuvant Herceptin only. Proceed with cycle 15 of 18 today. MUGA scan from March 28, 2016 reported an EF of 68%, repeat in 3 months. Now that patient has completed her XRT, have recommended initiating an aromatase inhibitor. Will start letrozole completing 5 years of treatment in December 2022. Return to clinic in 3 weeks for consideration of cycle 16 of Herceptin only. Need baseline bone density scan scheduled. 2. Clinical stage Ia ER/PR positive, HER-2 negative invasive ductal carcinoma of the upper outer quadrant of right breast, now pathologic stage 0: Complete pathologic response. Patient has completed XRT. Letrozole as above. 3. Pulmonary nodule: CT from December 09, 2015 results reviewed independently with pulmonary lesion that is unchanged. No intervention is needed. Consider repeat CT scan in 6 months.   4. Anemia: Patient's hemoglobin is now within normal limits. She received IV iron in June 2017.  5. Acid  Reflux: Continue omeprazole.  Patient expressed understanding and was in agreement with this plan. She also understands that She can call clinic at any time with any questions, concerns, or complaints.    JeFaythe CasaNP  04/04/2016 15:49  Patient was seen and evaluated independently and I agree with the assessment and plan as dictated above.  Patient as finally agreed to initiate letrozole.  Order baseline BMD a next clinic visit.  TiLloyd HugerMD 04/04/16 10:05 PM

## 2016-04-05 ENCOUNTER — Encounter: Payer: Self-pay | Admitting: Radiation Oncology

## 2016-04-05 ENCOUNTER — Ambulatory Visit
Admission: RE | Admit: 2016-04-05 | Discharge: 2016-04-05 | Disposition: A | Discharge status: Home / Self Care | Payer: Medicare Other | Source: Ambulatory Visit | Attending: Radiation Oncology | Admitting: Radiation Oncology

## 2016-04-05 VITALS — BP 149/93 | HR 89 | Temp 96.9°F | Resp 20 | Wt 159.6 lb

## 2016-04-05 DIAGNOSIS — C50912 Malignant neoplasm of unspecified site of left female breast: Secondary | ICD-10-CM | POA: Insufficient documentation

## 2016-04-05 DIAGNOSIS — Z79811 Long term (current) use of aromatase inhibitors: Secondary | ICD-10-CM | POA: Insufficient documentation

## 2016-04-05 DIAGNOSIS — Z170 Estrogen receptor positive status [ER+]: Secondary | ICD-10-CM | POA: Insufficient documentation

## 2016-04-05 DIAGNOSIS — D0511 Intraductal carcinoma in situ of right breast: Secondary | ICD-10-CM | POA: Diagnosis not present

## 2016-04-05 DIAGNOSIS — Z923 Personal history of irradiation: Secondary | ICD-10-CM | POA: Insufficient documentation

## 2016-04-05 DIAGNOSIS — C50411 Malignant neoplasm of upper-outer quadrant of right female breast: Secondary | ICD-10-CM

## 2016-04-05 DIAGNOSIS — C50812 Malignant neoplasm of overlapping sites of left female breast: Secondary | ICD-10-CM

## 2016-04-05 NOTE — Progress Notes (Signed)
Radiation Oncology Follow up Note  Name: Stephanie Crosby   Date:   04/05/2016 MRN:  [id number] DOB: [birth year]    This 80 y.o. female presents to the clinic today for one-month follow-up status post bilateral whole breast radiation.  REFERRING PROVIDER: Maryland Pink, MD  HPI: Patient is a 80 year old female now out 1 month having completed bilateral breast radiation. Her left breast had an invasive lobular carcinoma stage Ia ER positive PR negative right breast for ductal carcinoma in situ ER/PR positive. She seen today in routine follow-up is doing well. She specifically denies breast tenderness cough or bone pain.. She has continued on Herceptin therapy. She's also been prescribed letrozole. She specifically denies breast tenderness cough or bone pain.  COMPLICATIONS OF TREATMENT: none  FOLLOW UP COMPLIANCE: keeps appointments   PHYSICAL EXAM:  BP (!) 149/93   Pulse 89   Temp (!) 96.9 F (36.1 C)   Resp 20   Wt 159 lb 9.8 oz (72.4 kg)   BMI 25.76 kg/m  Lungs are clear to A&P cardiac examination essentially unremarkable with regular rate and rhythm. No dominant mass or nodularity is noted in either breast in 2 positions examined. Incision is well-healed. No axillary or supraclavicular adenopathy is appreciated. Cosmetic result is excellent. Well-developed well-nourished patient in NAD. HEENT reveals PERLA, EOMI, discs not visualized.  Oral cavity is clear. No oral mucosal lesions are identified. Neck is clear without evidence of cervical or supraclavicular adenopathy. Lungs are clear to A&P. Cardiac examination is essentially unremarkable with regular rate and rhythm without murmur rub or thrill. Abdomen is benign with no organomegaly or masses noted. Motor sensory and DTR levels are equal and symmetric in the upper and lower extremities. Cranial nerves II through XII are grossly intact. Proprioception is intact. No peripheral adenopathy or edema is identified. No motor or sensory  levels are noted. Crude visual fields are within normal range.  RADIOLOGY RESULTS: No current films for review  PLAN: At the present time she is doing well. She's been prescribed letrozole which she will soon start. I've asked to see her back in 3-4 months for follow-up. Patient is done extremely well for her age with bilateral breast radiation. I am please were overall progress. Patient is to call sooner with any concerns.  I would like to take this opportunity to thank you for allowing me to participate in the care of your patient.Armstead Peaks., MD

## 2016-04-26 NOTE — Progress Notes (Signed)
_ [place]  Telephone:([phone] [phone]  ID: [name] OB: [birth year]  MR#: [id number]  JSE#:[id number]  [name], MD as PCP - General (Family Medicine) [name], MD as Consulting Physician (General Surgery)  CHIEF COMPLAINT:   1. Clinical stage IIb ER positive, PR negative, HER-2 overexpressing invasive lobular carcinoma overlapping sites of the left breast, now pathologic stage Ia (T1c,N0,M0). 2. Clinical stage Ia ER/PR positive, HER-2 negative invasive ductal carcinoma of the upper outer quadrant of right breast, now pathologic stage 0.  INTERVAL HISTORY: Patient returns to clinic today for further evaluation and consideration of cycle 16 of Herceptin only. She has now completed her XRT. She feels well and is asymptomatic. She complains of slight fatigue and occasional insomnia, and isany signing up for an exercise program on Tuesday and Thursday here at [facility]. She complains of consistent peripheral neuropathy in bilateral hands and feet, particularly noticeable in balls of feet at night when in bed.  She has no other neurologic complaints. She has stable mild edema in both lower legs.  She denies any fevers. She reports occasional bowel urgency, but no incontinence. She denies nausea, vomiting, diarrhea, or constipation. She has no urinary complaints. Patient offers no further specific complaints today.  REVIEW OF SYSTEMS:   Review of Systems  Constitutional: Positive for malaise/fatigue. Negative for fever and weight loss.  HENT: Negative for sore throat.   Eyes: Negative for discharge and redness.  Respiratory: Negative.  Negative for shortness of breath.   Cardiovascular: Positive for leg swelling. Negative for chest pain.  Gastrointestinal: Negative for diarrhea, nausea and vomiting.  Musculoskeletal: Negative.   Skin: Negative.   Neurological: Positive for tingling and sensory change (Mild neuropathy ).  Negative for weakness and headaches.  Psychiatric/Behavioral: The patient has insomnia. The patient is not nervous/anxious.     As per HPI. Otherwise, a complete review of systems is negative.  PAST MEDICAL HISTORY: Past Medical History:  Diagnosis Date  . Anemia   . Back injury   . Bilateral breast cancer ([place]) 05/2015   Chemo tx's.  . Breast cancer in situ 05/2015   Left  . Edema of leg   . GERD (gastroesophageal reflux disease)   . Gonalgia   . Hiatal hernia   . Seasonal allergic rhinitis   . Sleep apnea   . Squamous cell skin cancer 2011   resected from left side of nose and Right axilla area.   . Tearing eyes    SINCE TAKING CHEMO    PAST SURGICAL HISTORY: Past Surgical History:  Procedure Laterality Date  . APPENDECTOMY    . AXILLARY SENTINEL NODE BIOPSY Bilateral 12/10/2015   Procedure: BILATERAL SENTINEL NODE BIOPSY, SENTINNEL NODE INJ.;  Surgeon: [name], MD;  Location: [facility] ORS;  Service: General;  Laterality: Bilateral;  . BREAST BIOPSY Bilateral 11/17/2015   Procedure: BREAST BIOPSY WITH NEEDLE LOCALIZATION;  Surgeon: [name], MD;  Location: [facility] ORS;  Service: General;  Laterality: Bilateral;  . CATARACT EXTRACTION, BILATERAL Bilateral 2012  . COLONOSCOPY    . PORTACATH PLACEMENT Right 06/10/2015   Procedure: INSERTION PORT-A-CATH;  Surgeon: [name], MD;  Location: [facility] ORS;  Service: General;  Laterality: Right;    FAMILY HISTORY: Patient reports a maternal aunt with breast cancer as well as a female cousin with breast cancer.  Family History  Problem Relation Age of Onset  . Dementia Mother   . Hyperlipidemia Mother   . Hypertension Mother   .  Congestive Heart Failure Mother   . Heart disease Mother   . Aneurysm Father     abdominal  . Fibromyalgia Sister   . Heart disease Sister   . Cancer Maternal Uncle     brain  . Heart disease Maternal Uncle   . Ovarian cancer Maternal Grandmother   . Cancer Cousin     breast cancer  .  Breast cancer Neg Hx        ADVANCED DIRECTIVES:    HEALTH MAINTENANCE: Social History  Substance Use Topics  . Smoking status: Never Smoker  . Smokeless tobacco: Never Used  . Alcohol use 0.0 oz/week     Comment: 3 glasses wine/week      Allergies  Allergen Reactions  . Penicillins Rash    Has patient had a PCN reaction causing immediate rash, facial/tongue/throat swelling, SOB or lightheadedness with hypotension: unsure Has patient had a PCN reaction causing severe rash involving mucus membranes or skin necrosis: no Has patient had a PCN reaction that required hospitalization no Has patient had a PCN reaction occurring within the last 10 years: no If all of the above answers are "NO", then may proceed with Cephalosporin use.    Current Outpatient Prescriptions  Medication Sig Dispense Refill  . acetaminophen (TYLENOL) 500 MG tablet Take 500 mg by mouth every 6 (six) hours as needed for mild pain.    . diphenhydrAMINE (SOMINEX) 25 MG tablet Take 25 mg by mouth every other day. ALTERNATES WITH CLARITIN    . gabapentin (NEURONTIN) 300 MG capsule Take 1 capsule (300 mg total) by mouth at bedtime. 30 capsule 2  . lidocaine-prilocaine (EMLA) cream Apply 1 application topically as needed (for chemo therapy port access).     Marland Kitchen loratadine (CLARITIN) 10 MG tablet Take 10 mg by mouth every other day. ALTERNATES WITH BENADRYL    . Multiple Vitamins-Minerals (CENTRUM SILVER PO) Take 1 tablet by mouth daily. Reported on 06/15/2015    . omeprazole (PRILOSEC) 20 MG capsule Take 1 capsule by mouth every morning. Reported on 06/15/2015    . traMADol (ULTRAM) 50 MG tablet Take 1 tablet (50 mg total) by mouth every 8 (eight) hours as needed. (Patient taking differently: Take 50 mg by mouth every 8 (eight) hours as needed for moderate pain. ) 20 tablet 0  . valACYclovir (VALTREX) 500 MG tablet Take 500 mg by mouth daily as needed (break outs). Reported on 06/15/2015     No current  facility-administered medications for this visit.    Facility-Administered Medications Ordered in Other Visits  Medication Dose Route Frequency Provider Last Rate Last Dose  . heparin lock flush 100 unit/mL  500 Units Intravenous Once Lloyd Huger, MD      . sodium chloride flush (NS) 0.9 % injection 10 mL  10 mL Intracatheter PRN Lloyd Huger, MD      . sodium chloride flush (NS) 0.9 % injection 10 mL  10 mL Intravenous PRN Lloyd Huger, MD   10 mL at 04/27/16 1345    OBJECTIVE: Vitals:   04/27/16 1401  BP: 135/84  Pulse: 81  Resp: 18  Temp: 98.3 F (36.8 C)     Body mass index is 25.41 kg/m.    ECOG FS:0 - Asymptomatic  General: Well-developed, well-nourished, no acute distress. Eyes: Pink conjunctiva, anicteric sclera. Breasts: Exam deferred today. Lungs: Clear to auscultation bilaterally. Heart: Regular rate and rhythm. No rubs, murmurs, or gallops. Abdomen: Soft, nontender, nondistended. No organomegaly noted, normoactive bowel sounds.  Musculoskeletal: Non-pitting edema in bilateral lower extremities. No cyanosis, or clubbing. Neuro: Alert, answering all questions appropriately. Cranial nerves grossly intact. Skin: ecchymosis right breast. Psych: Normal affect.   LAB RESULTS:  Lab Results  Component Value Date   NA 138 04/27/2016   K 4.2 04/27/2016   CL 109 04/27/2016   CO2 25 04/27/2016   GLUCOSE 128 (H) 04/27/2016   BUN 19 04/27/2016   CREATININE 0.63 04/27/2016   CALCIUM 9.5 04/27/2016   PROT 6.6 04/27/2016   ALBUMIN 3.6 04/27/2016   AST 26 04/27/2016   ALT 21 04/27/2016   ALKPHOS 54 04/27/2016   BILITOT 1.1 04/27/2016   GFRNONAA >60 04/27/2016   GFRAA >60 04/27/2016    Lab Results  Component Value Date   WBC 3.4 (L) 04/27/2016   NEUTROABS 2.1 04/27/2016   HGB 13.9 04/27/2016   HCT 41.0 04/27/2016   MCV 95.5 04/27/2016   PLT 178 04/27/2016   Lab Results  Component Value Date   IRON 122 03/14/2016   TIBC 298 03/14/2016    IRONPCTSAT 41 (H) 03/14/2016   Lab Results  Component Value Date   FERRITIN 61 03/14/2016     STUDIES: No results found.  ASSESSMENT:   1. Clinical stage IIb ER positive, PR negative, HER-2 overexpressing invasive lobular carcinoma overlapping sites of the left breast, now pathologic stage Ia (T1c,N0,M0). 2. Clinical stage Ia ER/PR positive, HER-2 negative invasive ductal carcinoma of the upper outer quadrant of right breast, now pathologic stage 0. 3. BRCA 1 and 2 negative.  PLAN:    1. Clinical stage IIb ER positive, PR negative, HER-2 overexpressing invasive lobular carcinoma overlapping sites of the left breast, now pathologic stage Ia (T1c,N0,M0): Patient noted to have residual stage IA tumor and her left breast with a complete pathologic response in her right breast. Patient completed 6 cycles of chemotherapy in June 2017. Proceed with cycle 16 of 18 today. MUGA scan from March 28, 2016 reported an EF of 68%, repeat in 3 months. Now that patient has completed her XRT, have recommended initiating an aromatase inhibitor. Will start letrozole completing 5 years of treatment in December 2022. Return to clinic in 3 weeks for consideration of cycle 17 of Herceptin only. Need baseline bone density scan scheduled. 2. Clinical stage Ia ER/PR positive, HER-2 negative invasive ductal carcinoma of the upper outer quadrant of right breast, now pathologic stage 0: Complete pathologic response. Patient has completed XRT. Letrozole as above. 3. Pulmonary nodule: CT from December 09, 2015 results reviewed independently with pulmonary lesion that is unchanged. No intervention is needed. Consider repeat CT scan in 6 months.   4. Anemia: Patient's hemoglobin is now within normal limits. She received IV iron in June 2017.  5. Acid Reflux: Continue omeprazole. 6. Insomia: Discussed 4-7-8 breathing technique at bedtime: breathe in to count of 4, hold breath for count of 7, exhale for count of 8; do 3-5 times  for letting go of overactive thoughts.   Patient expressed understanding and was in agreement with this plan. She also understands that She can call clinic at any time with any questions, concerns, or complaints.    Lucendia Herrlich, RN 04/27/16 1452  Patient was seen and evaluated independently and I agree with the assessment and plan as dictated above.  Lloyd Huger, MD 04/29/16 12:19 PM

## 2016-04-27 ENCOUNTER — Inpatient Hospital Stay: Payer: Medicare Other

## 2016-04-27 ENCOUNTER — Inpatient Hospital Stay (HOSPITAL_BASED_OUTPATIENT_CLINIC_OR_DEPARTMENT_OTHER): Payer: Medicare Other | Admitting: Oncology

## 2016-04-27 VITALS — BP 135/84 | HR 81 | Temp 98.3°F | Resp 18 | Wt 157.4 lb

## 2016-04-27 DIAGNOSIS — R911 Solitary pulmonary nodule: Secondary | ICD-10-CM | POA: Diagnosis not present

## 2016-04-27 DIAGNOSIS — G629 Polyneuropathy, unspecified: Secondary | ICD-10-CM

## 2016-04-27 DIAGNOSIS — K219 Gastro-esophageal reflux disease without esophagitis: Secondary | ICD-10-CM

## 2016-04-27 DIAGNOSIS — K449 Diaphragmatic hernia without obstruction or gangrene: Secondary | ICD-10-CM

## 2016-04-27 DIAGNOSIS — C50812 Malignant neoplasm of overlapping sites of left female breast: Secondary | ICD-10-CM | POA: Diagnosis not present

## 2016-04-27 DIAGNOSIS — Z803 Family history of malignant neoplasm of breast: Secondary | ICD-10-CM

## 2016-04-27 DIAGNOSIS — C50912 Malignant neoplasm of unspecified site of left female breast: Secondary | ICD-10-CM

## 2016-04-27 DIAGNOSIS — Z85828 Personal history of other malignant neoplasm of skin: Secondary | ICD-10-CM

## 2016-04-27 DIAGNOSIS — R609 Edema, unspecified: Secondary | ICD-10-CM

## 2016-04-27 DIAGNOSIS — Z170 Estrogen receptor positive status [ER+]: Secondary | ICD-10-CM | POA: Diagnosis not present

## 2016-04-27 DIAGNOSIS — Z79899 Other long term (current) drug therapy: Secondary | ICD-10-CM

## 2016-04-27 DIAGNOSIS — R5383 Other fatigue: Secondary | ICD-10-CM

## 2016-04-27 DIAGNOSIS — Z808 Family history of malignant neoplasm of other organs or systems: Secondary | ICD-10-CM

## 2016-04-27 DIAGNOSIS — Z8041 Family history of malignant neoplasm of ovary: Secondary | ICD-10-CM

## 2016-04-27 DIAGNOSIS — Z9221 Personal history of antineoplastic chemotherapy: Secondary | ICD-10-CM

## 2016-04-27 DIAGNOSIS — C50411 Malignant neoplasm of upper-outer quadrant of right female breast: Secondary | ICD-10-CM | POA: Diagnosis not present

## 2016-04-27 DIAGNOSIS — Z923 Personal history of irradiation: Secondary | ICD-10-CM

## 2016-04-27 DIAGNOSIS — C50012 Malignant neoplasm of nipple and areola, left female breast: Secondary | ICD-10-CM

## 2016-04-27 DIAGNOSIS — Z9049 Acquired absence of other specified parts of digestive tract: Secondary | ICD-10-CM

## 2016-04-27 DIAGNOSIS — D649 Anemia, unspecified: Secondary | ICD-10-CM

## 2016-04-27 DIAGNOSIS — G4700 Insomnia, unspecified: Secondary | ICD-10-CM

## 2016-04-27 DIAGNOSIS — G4730 Sleep apnea, unspecified: Secondary | ICD-10-CM

## 2016-04-27 LAB — COMPREHENSIVE METABOLIC PANEL
ALBUMIN: 3.6 g/dL (ref 3.5–5.0)
ALK PHOS: 54 U/L (ref 38–126)
ALT: 21 U/L (ref 14–54)
AST: 26 U/L (ref 15–41)
Anion gap: 4 — ABNORMAL LOW (ref 5–15)
BUN: 19 mg/dL (ref 6–20)
CALCIUM: 9.5 mg/dL (ref 8.9–10.3)
CO2: 25 mmol/L (ref 22–32)
CREATININE: 0.63 mg/dL (ref 0.44–1.00)
Chloride: 109 mmol/L (ref 101–111)
GFR calc Af Amer: >60 mL/min (ref >60)
GFR calc non Af Amer: >60 mL/min (ref >60)
GLUCOSE: 128 mg/dL — AB (ref 65–99)
Potassium: 4.2 mmol/L (ref 3.5–5.1)
SODIUM: 138 mmol/L (ref 135–145)
Total Bilirubin: 1.1 mg/dL (ref 0.3–1.2)
Total Protein: 6.6 g/dL (ref 6.5–8.1)

## 2016-04-27 LAB — CBC WITH DIFFERENTIAL/PLATELET
Basophils Absolute: 0 K/uL (ref 0–0.1)
Basophils Relative: 1 %
Eosinophils Absolute: 0.1 K/uL (ref 0–0.7)
Eosinophils Relative: 2 %
HCT: 41 % (ref 35.0–47.0)
Hemoglobin: 13.9 g/dL (ref 12.0–16.0)
Lymphocytes Relative: 27 %
Lymphs Abs: 0.9 K/uL — ABNORMAL LOW (ref 1.0–3.6)
MCH: 32.4 pg (ref 26.0–34.0)
MCHC: 33.9 g/dL (ref 32.0–36.0)
MCV: 95.5 fL (ref 80.0–100.0)
Monocytes Absolute: 0.3 K/uL (ref 0.2–0.9)
Monocytes Relative: 10 %
Neutro Abs: 2.1 K/uL (ref 1.4–6.5)
Neutrophils Relative %: 60 %
Platelets: 178 K/uL (ref 150–440)
RBC: 4.29 MIL/uL (ref 3.80–5.20)
RDW: 13.4 % (ref 11.5–14.5)
WBC: 3.4 K/uL — ABNORMAL LOW (ref 3.6–11.0)

## 2016-04-27 MED ORDER — DIPHENHYDRAMINE HCL 25 MG PO CAPS
25.0000 mg | ORAL_CAPSULE | Freq: Once | ORAL | Status: AC
  Administered 2016-04-27: 25 mg via ORAL
  Filled 2016-04-27: qty 1

## 2016-04-27 MED ORDER — HEPARIN SOD (PORK) LOCK FLUSH 100 UNIT/ML IV SOLN
500.0000 [IU] | Freq: Once | INTRAVENOUS | Status: DC | PRN
  Filled 2016-04-27: qty 5

## 2016-04-27 MED ORDER — HEPARIN SOD (PORK) LOCK FLUSH 100 UNIT/ML IV SOLN
500.0000 [IU] | Freq: Once | INTRAVENOUS | Status: AC
  Administered 2016-04-27: 500 [IU] via INTRAVENOUS

## 2016-04-27 MED ORDER — TRASTUZUMAB CHEMO INJECTION 440 MG
450.0000 mg | Freq: Once | INTRAVENOUS | Status: AC
  Administered 2016-04-27: 450 mg via INTRAVENOUS
  Filled 2016-04-27: qty 21.43

## 2016-04-27 MED ORDER — SODIUM CHLORIDE 0.9 % IV SOLN
Freq: Once | INTRAVENOUS | Status: AC
  Administered 2016-04-27: 14:00:00 via INTRAVENOUS
  Filled 2016-04-27: qty 1000

## 2016-04-27 MED ORDER — SODIUM CHLORIDE 0.9% FLUSH
10.0000 mL | INTRAVENOUS | Status: DC | PRN
  Administered 2016-04-27: 10 mL via INTRAVENOUS
  Filled 2016-04-27: qty 10

## 2016-04-27 MED ORDER — ACETAMINOPHEN 325 MG PO TABS
650.0000 mg | ORAL_TABLET | Freq: Once | ORAL | Status: AC
  Administered 2016-04-27: 650 mg via ORAL
  Filled 2016-04-27: qty 2

## 2016-04-27 MED ORDER — LETROZOLE 2.5 MG PO TABS: 2.5000 mg | ORAL_TABLET | Freq: Every day | ORAL | 6 refills | Status: DC

## 2016-04-27 NOTE — Progress Notes (Signed)
Offers no complaints  

## 2016-05-13 ENCOUNTER — Other Ambulatory Visit: Payer: Self-pay | Admitting: *Deleted

## 2016-05-13 DIAGNOSIS — C50912 Malignant neoplasm of unspecified site of left female breast: Secondary | ICD-10-CM

## 2016-05-16 NOTE — Progress Notes (Deleted)
_ [place]  Telephone:([phone] [phone]  ID: [name] OB: 1936/01/22  MR#: [id number]  HHI#:[id number]  [name], MD as PCP - General (Family Medicine) [name], MD as Consulting Physician (General Surgery)  CHIEF COMPLAINT:   1. Clinical stage IIb ER positive, PR negative, HER-2 overexpressing invasive lobular carcinoma overlapping sites of the left breast, now pathologic stage Ia (T1c,N0,M0). 2. Clinical stage Ia ER/PR positive, HER-2 negative invasive ductal carcinoma of the upper outer quadrant of right breast, now pathologic stage 0.  INTERVAL HISTORY: Patient returns to clinic today for further evaluation and consideration of cycle 16 of Herceptin only. She has now completed her XRT. She feels well and is asymptomatic. She complains of slight fatigue and occasional insomnia, and isany signing up for an exercise program on Tuesday and Thursday here at [facility]. She complains of consistent peripheral neuropathy in bilateral hands and feet, particularly noticeable in balls of feet at night when in bed.  She has no other neurologic complaints. She has stable mild edema in both lower legs.  She denies any fevers. She reports occasional bowel urgency, but no incontinence. She denies nausea, vomiting, diarrhea, or constipation. She has no urinary complaints. Patient offers no further specific complaints today.  REVIEW OF SYSTEMS:   Review of Systems  Constitutional: Positive for malaise/fatigue. Negative for fever and weight loss.  HENT: Negative for sore throat.   Eyes: Negative for discharge and redness.  Respiratory: Negative.  Negative for shortness of breath.   Cardiovascular: Positive for leg swelling. Negative for chest pain.  Gastrointestinal: Negative for diarrhea, nausea and vomiting.  Musculoskeletal: Negative.   Skin: Negative.   Neurological: Positive for tingling and sensory change (Mild neuropathy ).  Negative for weakness and headaches.  Psychiatric/Behavioral: The patient has insomnia. The patient is not nervous/anxious.     As per HPI. Otherwise, a complete review of systems is negative.  PAST MEDICAL HISTORY: Past Medical History:  Diagnosis Date  . Anemia   . Back injury   . Bilateral breast cancer ([place]) 05/2015   Chemo tx's.  . Breast cancer in situ 05/2015   Left  . Edema of leg   . GERD (gastroesophageal reflux disease)   . Gonalgia   . Hiatal hernia   . Seasonal allergic rhinitis   . Sleep apnea   . Squamous cell skin cancer 2011   resected from left side of nose and Right axilla area.   . Tearing eyes    SINCE TAKING CHEMO    PAST SURGICAL HISTORY: Past Surgical History:  Procedure Laterality Date  . APPENDECTOMY    . AXILLARY SENTINEL NODE BIOPSY Bilateral 12/10/2015   Procedure: BILATERAL SENTINEL NODE BIOPSY, SENTINNEL NODE INJ.;  Surgeon: [name], MD;  Location: [facility] ORS;  Service: General;  Laterality: Bilateral;  . BREAST BIOPSY Bilateral 11/17/2015   Procedure: BREAST BIOPSY WITH NEEDLE LOCALIZATION;  Surgeon: [name], MD;  Location: [facility] ORS;  Service: General;  Laterality: Bilateral;  . CATARACT EXTRACTION, BILATERAL Bilateral 2012  . COLONOSCOPY    . PORTACATH PLACEMENT Right 06/10/2015   Procedure: INSERTION PORT-A-CATH;  Surgeon: [name], MD;  Location: [facility] ORS;  Service: General;  Laterality: Right;    FAMILY HISTORY: Patient reports a maternal aunt with breast cancer as well as a female cousin with breast cancer.  Family History  Problem Relation Age of Onset  . Dementia Mother   . Hyperlipidemia Mother   . Hypertension Mother   .  Congestive Heart Failure Mother   . Heart disease Mother   . Aneurysm Father     abdominal  . Fibromyalgia Sister   . Heart disease Sister   . Cancer Maternal Uncle     brain  . Heart disease Maternal Uncle   . Ovarian cancer Maternal Grandmother   . Cancer Cousin     breast cancer  .  Breast cancer Neg Hx        ADVANCED DIRECTIVES:    HEALTH MAINTENANCE: Social History  Substance Use Topics  . Smoking status: Never Smoker  . Smokeless tobacco: Never Used  . Alcohol use 0.0 oz/week     Comment: 3 glasses wine/week      Allergies  Allergen Reactions  . Penicillins Rash    Has patient had a PCN reaction causing immediate rash, facial/tongue/throat swelling, SOB or lightheadedness with hypotension: unsure Has patient had a PCN reaction causing severe rash involving mucus membranes or skin necrosis: no Has patient had a PCN reaction that required hospitalization no Has patient had a PCN reaction occurring within the last 10 years: no If all of the above answers are "NO", then may proceed with Cephalosporin use.    Current Outpatient Prescriptions  Medication Sig Dispense Refill  . acetaminophen (TYLENOL) 500 MG tablet Take 500 mg by mouth every 6 (six) hours as needed for mild pain.    . diphenhydrAMINE (SOMINEX) 25 MG tablet Take 25 mg by mouth every other day. ALTERNATES WITH CLARITIN    . gabapentin (NEURONTIN) 300 MG capsule Take 1 capsule (300 mg total) by mouth at bedtime. 30 capsule 2  . letrozole (FEMARA) 2.5 MG tablet Take 1 tablet (2.5 mg total) by mouth daily. 30 tablet 6  . lidocaine-prilocaine (EMLA) cream Apply 1 application topically as needed (for chemo therapy port access).     Marland Kitchen loratadine (CLARITIN) 10 MG tablet Take 10 mg by mouth every other day. ALTERNATES WITH BENADRYL    . Multiple Vitamins-Minerals (CENTRUM SILVER PO) Take 1 tablet by mouth daily. Reported on 06/15/2015    . omeprazole (PRILOSEC) 20 MG capsule Take 1 capsule by mouth every morning. Reported on 06/15/2015    . traMADol (ULTRAM) 50 MG tablet Take 1 tablet (50 mg total) by mouth every 8 (eight) hours as needed. (Patient taking differently: Take 50 mg by mouth every 8 (eight) hours as needed for moderate pain. ) 20 tablet 0  . valACYclovir (VALTREX) 500 MG tablet Take 500 mg  by mouth daily as needed (break outs). Reported on 06/15/2015     No current facility-administered medications for this visit.     OBJECTIVE: There were no vitals filed for this visit.   There is no height or weight on file to calculate BMI.    ECOG FS:0 - Asymptomatic  General: Well-developed, well-nourished, no acute distress. Eyes: Pink conjunctiva, anicteric sclera. Breasts: Exam deferred today. Lungs: Clear to auscultation bilaterally. Heart: Regular rate and rhythm. No rubs, murmurs, or gallops. Abdomen: Soft, nontender, nondistended. No organomegaly noted, normoactive bowel sounds. Musculoskeletal: Non-pitting edema in bilateral lower extremities. No cyanosis, or clubbing. Neuro: Alert, answering all questions appropriately. Cranial nerves grossly intact. Skin: ecchymosis right breast. Psych: Normal affect.   LAB RESULTS:  Lab Results  Component Value Date   NA 138 04/27/2016   K 4.2 04/27/2016   CL 109 04/27/2016   CO2 25 04/27/2016   GLUCOSE 128 (H) 04/27/2016   BUN 19 04/27/2016   CREATININE 0.63 04/27/2016   CALCIUM 9.5  04/27/2016   PROT 6.6 04/27/2016   ALBUMIN 3.6 04/27/2016   AST 26 04/27/2016   ALT 21 04/27/2016   ALKPHOS 54 04/27/2016   BILITOT 1.1 04/27/2016   GFRNONAA >60 04/27/2016   GFRAA >60 04/27/2016    Lab Results  Component Value Date   WBC 3.4 (L) 04/27/2016   NEUTROABS 2.1 04/27/2016   HGB 13.9 04/27/2016   HCT 41.0 04/27/2016   MCV 95.5 04/27/2016   PLT 178 04/27/2016   Lab Results  Component Value Date   IRON 122 03/14/2016   TIBC 298 03/14/2016   IRONPCTSAT 41 (H) 03/14/2016   Lab Results  Component Value Date   FERRITIN 61 03/14/2016     STUDIES: No results found.  ASSESSMENT:   1. Clinical stage IIb ER positive, PR negative, HER-2 overexpressing invasive lobular carcinoma overlapping sites of the left breast, now pathologic stage Ia (T1c,N0,M0). 2. Clinical stage Ia ER/PR positive, HER-2 negative invasive ductal  carcinoma of the upper outer quadrant of right breast, now pathologic stage 0. 3. BRCA 1 and 2 negative.  PLAN:    1. Clinical stage IIb ER positive, PR negative, HER-2 overexpressing invasive lobular carcinoma overlapping sites of the left breast, now pathologic stage Ia (T1c,N0,M0): Patient noted to have residual stage IA tumor and her left breast with a complete pathologic response in her right breast. Patient completed 6 cycles of chemotherapy in June 2017. Proceed with cycle 16 of 18 today. MUGA scan from March 28, 2016 reported an EF of 68%, repeat in 3 months. Now that patient has completed her XRT, have recommended initiating an aromatase inhibitor. Will start letrozole completing 5 years of treatment in December 2022. Return to clinic in 3 weeks for consideration of cycle 17 of Herceptin only. Need baseline bone density scan scheduled. 2. Clinical stage Ia ER/PR positive, HER-2 negative invasive ductal carcinoma of the upper outer quadrant of right breast, now pathologic stage 0: Complete pathologic response. Patient has completed XRT. Letrozole as above. 3. Pulmonary nodule: CT from December 09, 2015 results reviewed independently with pulmonary lesion that is unchanged. No intervention is needed. Consider repeat CT scan in 6 months.   4. Anemia: Patient's hemoglobin is now within normal limits. She received IV iron in June 2017.  5. Acid Reflux: Continue omeprazole. 6. [place]omia: Discussed 4-7-8 breathing technique at bedtime: breathe in to count of 4, hold breath for count of 7, exhale for count of 8; do 3-5 times for letting go of overactive thoughts.   Patient expressed understanding and was in agreement with this plan. She also understands that She can call clinic at any time with any questions, concerns, or complaints.    Lucendia Herrlich, RN 04/27/16 1452  Patient was seen and evaluated independently and I agree with the assessment and plan as dictated above.  Lloyd Huger,  MD 05/16/16 10:43 PM

## 2016-05-18 ENCOUNTER — Ambulatory Visit: Payer: Medicare Other | Admitting: Oncology

## 2016-05-18 ENCOUNTER — Other Ambulatory Visit: Payer: Medicare Other

## 2016-05-18 ENCOUNTER — Ambulatory Visit: Payer: Medicare Other

## 2016-05-19 ENCOUNTER — Ambulatory Visit: Payer: Medicare Other | Admitting: Oncology

## 2016-05-19 ENCOUNTER — Inpatient Hospital Stay: Payer: Medicare Other

## 2016-05-19 ENCOUNTER — Ambulatory Visit: Payer: Medicare Other

## 2016-05-19 ENCOUNTER — Other Ambulatory Visit: Payer: Medicare Other

## 2016-05-19 ENCOUNTER — Inpatient Hospital Stay: Payer: Medicare Other | Admitting: Oncology

## 2016-05-27 ENCOUNTER — Other Ambulatory Visit: Payer: Self-pay | Admitting: Oncology

## 2016-05-29 NOTE — Progress Notes (Signed)
_ [place]  [phone] [phone]  ID: [name] OB: 1936-02-25  MR#: [id number]  IHK#:[id number]  [name], MD as PCP - General (Family Medicine) [name], MD as Consulting Physician (General Surgery)  CHIEF COMPLAINT:   1. Clinical stage IIb ER positive, PR negative, HER-2 overexpressing invasive lobular carcinoma overlapping sites of the left breast, now pathologic stage Ia (T1c,N0,M0). 2. Clinical stage Ia ER/PR positive, HER-2 negative invasive ductal carcinoma of the upper outer quadrant of right breast, now pathologic stage 0.  INTERVAL HISTORY: Patient returns to clinic today for further evaluation and consideration of cycle 16 of Herceptin only. She feels well and is asymptomatic. She has not initiated letrozole yet. She complains of consistent peripheral neuropathy in bilateral hands and feet, particularly noticeable in balls of feet at night when in bed.  She has no other neurologic complaints. She has stable mild edema in both lower legs.  She denies any fevers. She reports occasional bowel urgency, but no incontinence. She denies nausea, vomiting, diarrhea, or constipation. She has no urinary complaints. Patient offers no further specific complaints today.  REVIEW OF SYSTEMS:   Review of Systems  Constitutional: Positive for malaise/fatigue. Negative for fever and weight loss.  HENT: Negative for sore throat.   Eyes: Negative for discharge and redness.  Respiratory: Negative.  Negative for shortness of breath.   Cardiovascular: Positive for leg swelling. Negative for chest pain.  Gastrointestinal: Negative for diarrhea, nausea and vomiting.  Musculoskeletal: Negative.   Skin: Negative.   Neurological: Positive for tingling and sensory change (Mild neuropathy ). Negative for weakness and headaches.  Psychiatric/Behavioral: The patient has insomnia. The patient is not nervous/anxious.     As per  HPI. Otherwise, a complete review of systems is negative.  PAST MEDICAL HISTORY: Past Medical History:  Diagnosis Date  . Anemia   . Back injury   . Bilateral breast cancer ([place]) 05/2015   Chemo tx's.  . Breast cancer in situ 05/2015   Left  . Edema of leg   . GERD (gastroesophageal reflux disease)   . Gonalgia   . Hiatal hernia   . Seasonal allergic rhinitis   . Sleep apnea   . Squamous cell skin cancer 2011   resected from left side of nose and Right axilla area.   . Tearing eyes    SINCE TAKING CHEMO    PAST SURGICAL HISTORY: Past Surgical History:  Procedure Laterality Date  . APPENDECTOMY    . AXILLARY SENTINEL NODE BIOPSY Bilateral 12/10/2015   Procedure: BILATERAL SENTINEL NODE BIOPSY, SENTINNEL NODE INJ.;  Surgeon: [name], MD;  Location: [facility] ORS;  Service: General;  Laterality: Bilateral;  . BREAST BIOPSY Bilateral 11/17/2015   Procedure: BREAST BIOPSY WITH NEEDLE LOCALIZATION;  Surgeon: [name], MD;  Location: [facility] ORS;  Service: General;  Laterality: Bilateral;  . CATARACT EXTRACTION, BILATERAL Bilateral 2012  . COLONOSCOPY    . PORTACATH PLACEMENT Right 06/10/2015   Procedure: INSERTION PORT-A-CATH;  Surgeon: [name], MD;  Location: [facility] ORS;  Service: General;  Laterality: Right;    FAMILY HISTORY: Patient reports a maternal aunt with breast cancer as well as a female cousin with breast cancer.  Family History  Problem Relation Age of Onset  . Dementia Mother   . Hyperlipidemia Mother   . Hypertension Mother   . Congestive Heart Failure Mother   . Heart disease Mother   . Aneurysm Father     abdominal  .  Fibromyalgia Sister   . Heart disease Sister   . Cancer Maternal Uncle     brain  . Heart disease Maternal Uncle   . Ovarian cancer Maternal Grandmother   . Cancer Cousin     breast cancer  . Breast cancer Neg Hx        ADVANCED DIRECTIVES:    HEALTH MAINTENANCE: Social History  Substance Use Topics  . Smoking status:  Never Smoker  . Smokeless tobacco: Never Used  . Alcohol use 0.0 oz/week     Comment: 3 glasses wine/week      Allergies  Allergen Reactions  . Penicillins Rash    Has patient had a PCN reaction causing immediate rash, facial/tongue/throat swelling, SOB or lightheadedness with hypotension: unsure Has patient had a PCN reaction causing severe rash involving mucus membranes or skin necrosis: no Has patient had a PCN reaction that required hospitalization no Has patient had a PCN reaction occurring within the last 10 years: no If all of the above answers are "NO", then may proceed with Cephalosporin use.    Current Outpatient Prescriptions  Medication Sig Dispense Refill  . acetaminophen (TYLENOL) 500 MG tablet Take 500 mg by mouth every 6 (six) hours as needed for mild pain.    . diphenhydrAMINE (SOMINEX) 25 MG tablet Take 25 mg by mouth every other day. ALTERNATES WITH CLARITIN    . gabapentin (NEURONTIN) 300 MG capsule Take 1 capsule (300 mg total) by mouth at bedtime. 30 capsule 2  . letrozole (FEMARA) 2.5 MG tablet Take 1 tablet (2.5 mg total) by mouth daily. 30 tablet 6  . lidocaine-prilocaine (EMLA) cream Apply 1 application topically as needed (for chemo therapy port access).     Marland Kitchen loratadine (CLARITIN) 10 MG tablet Take 10 mg by mouth every other day. ALTERNATES WITH BENADRYL    . Multiple Vitamins-Minerals (CENTRUM SILVER PO) Take 1 tablet by mouth daily. Reported on 06/15/2015    . omeprazole (PRILOSEC) 20 MG capsule Take 1 capsule by mouth every morning. Reported on 06/15/2015    . traMADol (ULTRAM) 50 MG tablet Take 1 tablet (50 mg total) by mouth every 8 (eight) hours as needed. (Patient taking differently: Take 50 mg by mouth every 8 (eight) hours as needed for moderate pain. ) 20 tablet 0  . valACYclovir (VALTREX) 500 MG tablet Take 500 mg by mouth daily as needed (break outs). Reported on 06/15/2015     No current facility-administered medications for this visit.      OBJECTIVE: Vitals:   05/30/16 0955  BP: (!) 141/92  Pulse: 90  Resp: 18     Body mass index is 24.62 kg/m.    ECOG FS:0 - Asymptomatic  General: Well-developed, well-nourished, no acute distress. Eyes: Pink conjunctiva, anicteric sclera. Breasts: Exam deferred today. Lungs: Clear to auscultation bilaterally. Heart: Regular rate and rhythm. No rubs, murmurs, or gallops. Abdomen: Soft, nontender, nondistended. No organomegaly noted, normoactive bowel sounds. Musculoskeletal: Non-pitting edema in bilateral lower extremities. No cyanosis, or clubbing. Neuro: Alert, answering all questions appropriately. Cranial nerves grossly intact. Skin: ecchymosis right breast. Psych: Normal affect.   LAB RESULTS:  Lab Results  Component Value Date   NA 137 05/30/2016   K 4.1 05/30/2016   CL 105 05/30/2016   CO2 27 05/30/2016   GLUCOSE 113 (H) 05/30/2016   BUN 22 (H) 05/30/2016   CREATININE 0.71 05/30/2016   CALCIUM 9.9 05/30/2016   PROT 6.9 05/30/2016   ALBUMIN 3.4 (L) 05/30/2016   AST 23 05/30/2016  ALT 16 05/30/2016   ALKPHOS 74 05/30/2016   BILITOT 1.0 05/30/2016   GFRNONAA >60 05/30/2016   GFRAA >60 05/30/2016    Lab Results  Component Value Date   WBC 4.4 05/30/2016   NEUTROABS 2.7 05/30/2016   HGB 13.7 05/30/2016   HCT 40.3 05/30/2016   MCV 93.6 05/30/2016   PLT 160 05/30/2016   Lab Results  Component Value Date   IRON 122 03/14/2016   TIBC 298 03/14/2016   IRONPCTSAT 41 (H) 03/14/2016   Lab Results  Component Value Date   FERRITIN 61 03/14/2016     STUDIES: Dg Bone Density  Result Date: 06/01/2016 EXAM: DUAL X-RAY ABSORPTIOMETRY (DXA) FOR BONE MINERAL DENSITY IMPRESSION: Dear Dr. Grayland Ormond, Your patient Cindi Ghazarian completed a BMD test on 06/01/2016 using the [employer] (analysis version: 14.10) manufactured by [employer]. The following summarizes the results of our evaluation. PATIENT BIOGRAPHICAL: Name: Stephanie Crosby, Stephanie Crosby Patient ID:  [id number] Birth Date: December 27, 1935 Height: 65.0 in. Gender: Female Exam Date: 06/01/2016 Weight: 151.0 lbs. Indications: Breast CA, Caucasian, Postmenopausal Fractures: coccyx, Right foot Treatments: CALCIUM VIT D, Multi-Vitamin with calcium ASSESSMENT: The BMD measured at AP Spine L1-L4 is 0.856 g/cm2 with a T-score of -2.7. This patient is considered osteoporotic according to [facility]) criteria. Site Region Measured Measured WHO Young Adult BMD Date       Age      Classification T-score AP Spine L1-L4 06/01/2016 79.0 Osteoporosis -2.7 0.856 g/cm2 DualFemur Total Right 06/01/2016 79.0 Osteoporosis -2.6 0.679 g/cm2 World Health Organization [facility]) criteria for post-menopausal, Caucasian Women: Normal:       T-score at or above -1 SD Osteopenia:   T-score between -1 and -2.5 SD Osteoporosis: T-score at or below -2.5 SD RECOMMENDATIONS: [place] recommends that FDA-approved medical therapies be considered in postmenopausal women and men age 50 or older with a: 1. Hip or vertebral (clinical or morphometric) fracture. 2. T-score of < -2.5 at the spine or hip. 3. Ten-year fracture probability by FRAX of 3% or greater for hip fracture or 20% or greater for major osteoporotic fracture. All treatment decisions require clinical judgment and consideration of individual patient factors, including patient preferences, co-morbidities, previous drug use, risk factors not captured in the FRAX model (e.g. falls, vitamin D deficiency, increased bone turnover, interval significant decline in bone density) and possible under - or over-estimation of fracture risk by FRAX. All patients should ensure an adequate intake of dietary calcium (1200 mg/d) and vitamin D (800 IU daily) unless contraindicated. FOLLOW-UP: People with diagnosed cases of osteoporosis or at high risk for fracture should have regular bone mineral density tests. For patients eligible for Medicare, routine testing is allowed  once every 2 years. The testing frequency can be increased to one year for patients who have rapidly progressing disease, those who are receiving or discontinuing medical therapy to restore bone mass, or have additional risk factors. I have reviewed this report, and agree with the above findings. [facility] Electronically Signed   By: Rolm Baptise M.D.   On: 06/01/2016 11:25    ASSESSMENT:   1. Clinical stage IIb ER positive, PR negative, HER-2 overexpressing invasive lobular carcinoma overlapping sites of the left breast, now pathologic stage Ia (T1c,N0,M0). 2. Clinical stage Ia ER/PR positive, HER-2 negative invasive ductal carcinoma of the upper outer quadrant of right breast, now pathologic stage 0. 3. BRCA 1 and 2 negative.  PLAN:    1. Clinical stage IIb ER positive, PR  negative, HER-2 overexpressing invasive lobular carcinoma overlapping sites of the left breast, now pathologic stage Ia (T1c,N0,M0): Patient noted to have residual stage IA tumor and her left breast with a complete pathologic response in her right breast. Patient completed 6 cycles of chemotherapy in June 2017. Proceed with cycle 16 of 18 today. MUGA scan from March 28, 2016 reported an EF of 68%, repeat in 3 months. Now that patient has completed her XRT, have recommended initiating an aromatase inhibitor. Patient has not initiated letrozole yet. Return to clinic in 3 weeks for consideration of cycle 17 of Herceptin only. Need baseline bone density scan scheduled once letrozole was initiated. 2. Clinical stage Ia ER/PR positive, HER-2 negative invasive ductal carcinoma of the upper outer quadrant of right breast, now pathologic stage 0: Complete pathologic response. Patient has completed XRT. Letrozole as above. 3. Pulmonary nodule: CT from December 09, 2015 results reviewed independently with pulmonary lesion that is unchanged. No intervention is needed. Consider repeat CT scan in 6 months.   4. Anemia: Patient's  hemoglobin is now within normal limits. She received IV iron in June 2017.  5. Acid Reflux: Continue omeprazole.   Patient expressed understanding and was in agreement with this plan. She also understands that She can call clinic at any time with any questions, concerns, or complaints.    Lloyd Huger, MD 06/01/16 1:12 PM

## 2016-05-30 ENCOUNTER — Inpatient Hospital Stay: Payer: Medicare Other

## 2016-05-30 ENCOUNTER — Inpatient Hospital Stay: Payer: Medicare Other | Attending: Oncology | Admitting: Oncology

## 2016-05-30 VITALS — BP 141/92 | HR 90 | Resp 18 | Wt 152.6 lb

## 2016-05-30 DIAGNOSIS — R918 Other nonspecific abnormal finding of lung field: Secondary | ICD-10-CM | POA: Insufficient documentation

## 2016-05-30 DIAGNOSIS — G4730 Sleep apnea, unspecified: Secondary | ICD-10-CM | POA: Diagnosis not present

## 2016-05-30 DIAGNOSIS — C50812 Malignant neoplasm of overlapping sites of left female breast: Secondary | ICD-10-CM

## 2016-05-30 DIAGNOSIS — K219 Gastro-esophageal reflux disease without esophagitis: Secondary | ICD-10-CM | POA: Insufficient documentation

## 2016-05-30 DIAGNOSIS — Z170 Estrogen receptor positive status [ER+]: Secondary | ICD-10-CM

## 2016-05-30 DIAGNOSIS — Z9049 Acquired absence of other specified parts of digestive tract: Secondary | ICD-10-CM | POA: Insufficient documentation

## 2016-05-30 DIAGNOSIS — R609 Edema, unspecified: Secondary | ICD-10-CM | POA: Insufficient documentation

## 2016-05-30 DIAGNOSIS — G629 Polyneuropathy, unspecified: Secondary | ICD-10-CM | POA: Insufficient documentation

## 2016-05-30 DIAGNOSIS — Z85828 Personal history of other malignant neoplasm of skin: Secondary | ICD-10-CM

## 2016-05-30 DIAGNOSIS — Z5112 Encounter for antineoplastic immunotherapy: Secondary | ICD-10-CM | POA: Diagnosis not present

## 2016-05-30 DIAGNOSIS — C50411 Malignant neoplasm of upper-outer quadrant of right female breast: Secondary | ICD-10-CM | POA: Diagnosis not present

## 2016-05-30 DIAGNOSIS — K449 Diaphragmatic hernia without obstruction or gangrene: Secondary | ICD-10-CM

## 2016-05-30 DIAGNOSIS — Z9221 Personal history of antineoplastic chemotherapy: Secondary | ICD-10-CM | POA: Diagnosis not present

## 2016-05-30 DIAGNOSIS — C50912 Malignant neoplasm of unspecified site of left female breast: Secondary | ICD-10-CM

## 2016-05-30 LAB — CBC WITH DIFFERENTIAL/PLATELET
Basophils Absolute: 0 10*3/uL (ref 0–0.1)
Basophils Relative: 1 %
Eosinophils Absolute: 0.1 10*3/uL (ref 0–0.7)
Eosinophils Relative: 2 %
HEMATOCRIT: 40.3 % (ref 35.0–47.0)
HEMOGLOBIN: 13.7 g/dL (ref 12.0–16.0)
LYMPHS ABS: 1.1 10*3/uL (ref 1.0–3.6)
LYMPHS PCT: 25 %
MCH: 31.9 pg (ref 26.0–34.0)
MCHC: 34.1 g/dL (ref 32.0–36.0)
MCV: 93.6 fL (ref 80.0–100.0)
Monocytes Absolute: 0.4 10*3/uL (ref 0.2–0.9)
Monocytes Relative: 10 %
NEUTROS PCT: 62 %
Neutro Abs: 2.7 10*3/uL (ref 1.4–6.5)
Platelets: 160 10*3/uL (ref 150–440)
RBC: 4.31 MIL/uL (ref 3.80–5.20)
RDW: 13.2 % (ref 11.5–14.5)
WBC: 4.4 10*3/uL (ref 3.6–11.0)

## 2016-05-30 LAB — COMPREHENSIVE METABOLIC PANEL
ALK PHOS: 74 U/L (ref 38–126)
ALT: 16 U/L (ref 14–54)
AST: 23 U/L (ref 15–41)
Albumin: 3.4 g/dL — ABNORMAL LOW (ref 3.5–5.0)
Anion gap: 5 (ref 5–15)
BILIRUBIN TOTAL: 1 mg/dL (ref 0.3–1.2)
BUN: 22 mg/dL — ABNORMAL HIGH (ref 6–20)
CALCIUM: 9.9 mg/dL (ref 8.9–10.3)
CO2: 27 mmol/L (ref 22–32)
CREATININE: 0.71 mg/dL (ref 0.44–1.00)
Chloride: 105 mmol/L (ref 101–111)
Glucose, Bld: 113 mg/dL — ABNORMAL HIGH (ref 65–99)
Potassium: 4.1 mmol/L (ref 3.5–5.1)
Sodium: 137 mmol/L (ref 135–145)
Total Protein: 6.9 g/dL (ref 6.5–8.1)

## 2016-05-30 MED ORDER — DIPHENHYDRAMINE HCL 25 MG PO CAPS
25.0000 mg | ORAL_CAPSULE | Freq: Once | ORAL | Status: AC
  Administered 2016-05-30: 25 mg via ORAL
  Filled 2016-05-30: qty 1

## 2016-05-30 MED ORDER — TRASTUZUMAB CHEMO 150 MG IV SOLR
450.0000 mg | Freq: Once | INTRAVENOUS | Status: AC
  Administered 2016-05-30: 450 mg via INTRAVENOUS
  Filled 2016-05-30: qty 21.4

## 2016-05-30 MED ORDER — HEPARIN SOD (PORK) LOCK FLUSH 100 UNIT/ML IV SOLN
500.0000 [IU] | Freq: Once | INTRAVENOUS | Status: AC | PRN
  Administered 2016-05-30: 500 [IU]
  Filled 2016-05-30: qty 5

## 2016-05-30 MED ORDER — ACETAMINOPHEN 325 MG PO TABS
650.0000 mg | ORAL_TABLET | Freq: Once | ORAL | Status: AC
Start: 2016-05-30 — End: 2016-05-30
  Administered 2016-05-30: 650 mg via ORAL
  Filled 2016-05-30: qty 2

## 2016-05-30 MED ORDER — SODIUM CHLORIDE 0.9% FLUSH
10.0000 mL | INTRAVENOUS | Status: DC | PRN
  Administered 2016-05-30: 10 mL
  Filled 2016-05-30: qty 10

## 2016-05-30 MED ORDER — SODIUM CHLORIDE 0.9 % IV SOLN
Freq: Once | INTRAVENOUS | Status: AC
  Administered 2016-05-30: 11:00:00 via INTRAVENOUS
  Filled 2016-05-30: qty 1000

## 2016-05-30 NOTE — Progress Notes (Signed)
Patient offers no complaints today. 

## 2016-06-01 ENCOUNTER — Ambulatory Visit
Admission: RE | Admit: 2016-06-01 | Discharge: 2016-06-01 | Disposition: A | Discharge status: Home / Self Care | Payer: Medicare Other | Source: Ambulatory Visit | Attending: Oncology | Admitting: Oncology

## 2016-06-01 DIAGNOSIS — Z170 Estrogen receptor positive status [ER+]: Secondary | ICD-10-CM | POA: Insufficient documentation

## 2016-06-01 DIAGNOSIS — C50411 Malignant neoplasm of upper-outer quadrant of right female breast: Secondary | ICD-10-CM

## 2016-06-01 DIAGNOSIS — M810 Age-related osteoporosis without current pathological fracture: Secondary | ICD-10-CM | POA: Insufficient documentation

## 2016-06-17 ENCOUNTER — Other Ambulatory Visit: Payer: Self-pay

## 2016-06-17 DIAGNOSIS — C50411 Malignant neoplasm of upper-outer quadrant of right female breast: Secondary | ICD-10-CM

## 2016-06-17 DIAGNOSIS — Z170 Estrogen receptor positive status [ER+]: Principal | ICD-10-CM

## 2016-06-19 NOTE — Progress Notes (Signed)
_ [place]  Telephone:([phone] [phone]  ID: Stephanie Crosby OB: 09/19/1935  MR#: [id number]  DXI#:[id number]  [name], MD as PCP - General (Family Medicine) [name], MD as Consulting Physician (General Surgery)  CHIEF COMPLAINT:   1. Clinical stage IIb ER positive, PR negative, HER-2 overexpressing invasive lobular carcinoma overlapping sites of the left breast, now pathologic stage Ia (T1c,N0,M0). 2. Clinical stage Ia ER/PR positive, HER-2 negative invasive ductal carcinoma of the upper outer quadrant of right breast, now pathologic stage 0.  INTERVAL HISTORY: Patient returns to clinic today for further evaluation and consideration of cycle 17 of Herceptin only. She feels well and is asymptomatic. She has not initiated letrozole yet. She complains of consistent peripheral neuropathy in bilateral hands and feet, particularly noticeable in balls of feet at night when in bed.  She has no other neurologic complaints. She has stable mild edema in both lower legs.  She denies any fevers. She reports occasional bowel urgency, but no incontinence. She denies nausea, vomiting, diarrhea, or constipation. She has no urinary complaints. Patient offers no further specific complaints today.  REVIEW OF SYSTEMS:   Review of Systems  Constitutional: Positive for malaise/fatigue. Negative for fever and weight loss.  HENT: Negative for sore throat.   Eyes: Negative for discharge and redness.  Respiratory: Negative.  Negative for shortness of breath.   Cardiovascular: Positive for leg swelling. Negative for chest pain.  Gastrointestinal: Negative for diarrhea, nausea and vomiting.  Musculoskeletal: Negative.   Skin: Negative.   Neurological: Positive for tingling and sensory change (Mild neuropathy ). Negative for weakness and headaches.  Psychiatric/Behavioral: The patient has insomnia. The patient is not nervous/anxious.     As per  HPI. Otherwise, a complete review of systems is negative.  PAST MEDICAL HISTORY: Past Medical History:  Diagnosis Date  . Anemia   . Back injury   . Bilateral breast cancer ([place]) 05/2015   Chemo tx's.  . Breast cancer in situ 05/2015   Left  . Edema of leg   . GERD (gastroesophageal reflux disease)   . Gonalgia   . Hiatal hernia   . Seasonal allergic rhinitis   . Sleep apnea   . Squamous cell skin cancer 2011   resected from left side of nose and Right axilla area.   . Tearing eyes    SINCE TAKING CHEMO    PAST SURGICAL HISTORY: Past Surgical History:  Procedure Laterality Date  . APPENDECTOMY    . AXILLARY SENTINEL NODE BIOPSY Bilateral 12/10/2015   Procedure: BILATERAL SENTINEL NODE BIOPSY, SENTINNEL NODE INJ.;  Surgeon: [name], MD;  Location: [facility] ORS;  Service: General;  Laterality: Bilateral;  . BREAST BIOPSY Bilateral 11/17/2015   Procedure: BREAST BIOPSY WITH NEEDLE LOCALIZATION;  Surgeon: [name], MD;  Location: [facility] ORS;  Service: General;  Laterality: Bilateral;  . CATARACT EXTRACTION, BILATERAL Bilateral 2012  . COLONOSCOPY    . PORTACATH PLACEMENT Right 06/10/2015   Procedure: INSERTION PORT-A-CATH;  Surgeon: [name], MD;  Location: [facility] ORS;  Service: General;  Laterality: Right;    FAMILY HISTORY: Patient reports a maternal aunt with breast cancer as well as a female cousin with breast cancer.  Family History  Problem Relation Age of Onset  . Dementia Mother   . Hyperlipidemia Mother   . Hypertension Mother   . Congestive Heart Failure Mother   . Heart disease Mother   . Aneurysm Father     abdominal  .  Fibromyalgia Sister   . Heart disease Sister   . Cancer Maternal Uncle     brain  . Heart disease Maternal Uncle   . Pancreatic cancer Maternal Grandmother   . Cancer Cousin     female cousin with breast cancer and brain cancer  . Ovarian cancer Maternal Aunt   . Colon cancer Maternal Aunt   . Breast cancer Neg Hx         ADVANCED DIRECTIVES:    HEALTH MAINTENANCE: Social History  Substance Use Topics  . Smoking status: Never Smoker  . Smokeless tobacco: Never Used  . Alcohol use 0.0 oz/week     Comment: 3 glasses wine/week      Allergies  Allergen Reactions  . Penicillins Rash    Has patient had a PCN reaction causing immediate rash, facial/tongue/throat swelling, SOB or lightheadedness with hypotension: unsure Has patient had a PCN reaction causing severe rash involving mucus membranes or skin necrosis: no Has patient had a PCN reaction that required hospitalization no Has patient had a PCN reaction occurring within the last 10 years: no If all of the above answers are "NO", then may proceed with Cephalosporin use.    Current Outpatient Prescriptions  Medication Sig Dispense Refill  . acetaminophen (TYLENOL) 500 MG tablet Take 500 mg by mouth every 6 (six) hours as needed for mild pain.    . diphenhydrAMINE (SOMINEX) 25 MG tablet Take by mouth every other day. When needed    . lidocaine-prilocaine (EMLA) cream Apply 1 application topically as needed (for chemo therapy port access).     Marland Kitchen loratadine (CLARITIN) 10 MG tablet Take 10 mg by mouth every other day. Takes when needed    . Multiple Vitamins-Minerals (CENTRUM SILVER PO) Take 1 tablet by mouth daily. Reported on 06/15/2015    . omeprazole (PRILOSEC) 20 MG capsule Take 1 capsule by mouth every morning. Reported on 06/15/2015    . traMADol (ULTRAM) 50 MG tablet Take 1 tablet (50 mg total) by mouth every 8 (eight) hours as needed. (Patient taking differently: Take 50 mg by mouth every 8 (eight) hours as needed for moderate pain. ) 20 tablet 0  . valACYclovir (VALTREX) 500 MG tablet Take 500 mg by mouth daily as needed (break outs). Reported on 06/15/2015    . letrozole (FEMARA) 2.5 MG tablet Take 1 tablet (2.5 mg total) by mouth daily. (Patient not taking: Reported on 06/20/2016) 30 tablet 6   No current facility-administered medications  for this visit.     OBJECTIVE: Vitals:   06/20/16 0957  BP: (!) 161/88  Pulse: 83  Resp: 18  Temp: 97.6 F (36.4 C)     Body mass index is 25.26 kg/m.    ECOG FS:0 - Asymptomatic  General: Well-developed, well-nourished, no acute distress. Eyes: Pink conjunctiva, anicteric sclera. Breasts: Exam deferred today. Lungs: Clear to auscultation bilaterally. Heart: Regular rate and rhythm. No rubs, murmurs, or gallops. Abdomen: Soft, nontender, nondistended. No organomegaly noted, normoactive bowel sounds. Musculoskeletal: Non-pitting edema in bilateral lower extremities. No cyanosis, or clubbing. Neuro: Alert, answering all questions appropriately. Cranial nerves grossly intact. Skin: ecchymosis right breast. Psych: Normal affect.   LAB RESULTS:  Lab Results  Component Value Date   NA 137 05/30/2016   K 4.1 05/30/2016   CL 105 05/30/2016   CO2 27 05/30/2016   GLUCOSE 113 (H) 05/30/2016   BUN 22 (H) 05/30/2016   CREATININE 0.71 05/30/2016   CALCIUM 9.9 05/30/2016   PROT 6.9 05/30/2016  ALBUMIN 3.4 (L) 05/30/2016   AST 23 05/30/2016   ALT 16 05/30/2016   ALKPHOS 74 05/30/2016   BILITOT 1.0 05/30/2016   GFRNONAA >60 05/30/2016   GFRAA >60 05/30/2016    Lab Results  Component Value Date   WBC 4.1 06/20/2016   NEUTROABS 2.7 06/20/2016   HGB 13.2 06/20/2016   HCT 38.4 06/20/2016   MCV 93.7 06/20/2016   PLT 199 06/20/2016   Lab Results  Component Value Date   IRON 122 03/14/2016   TIBC 298 03/14/2016   IRONPCTSAT 41 (H) 03/14/2016   Lab Results  Component Value Date   FERRITIN 61 03/14/2016     STUDIES: Dg Bone Density  Result Date: 06/01/2016 EXAM: DUAL X-RAY ABSORPTIOMETRY (DXA) FOR BONE MINERAL DENSITY IMPRESSION: Dear Dr. Grayland Ormond, Your patient Vestal Crandall completed a BMD test on 06/01/2016 using the [place] (analysis version: 14.10) manufactured by [employer]. The following summarizes the results of our evaluation. PATIENT  BIOGRAPHICAL: Name: Stephanie, Crosby Patient ID: [id number] Birth Date: 01/24/1936 Height: 65.0 in. Gender: Female Exam Date: 06/01/2016 Weight: 151.0 lbs. Indications: Breast CA, Caucasian, Postmenopausal Fractures: coccyx, Right foot Treatments: CALCIUM VIT D, Multi-Vitamin with calcium ASSESSMENT: The BMD measured at AP Spine L1-L4 is 0.856 g/cm2 with a T-score of -2.7. This patient is considered osteoporotic according to [facility]) criteria. Site Region Measured Measured WHO Young Adult BMD Date       Age      Classification T-score AP Spine L1-L4 06/01/2016 79.0 Osteoporosis -2.7 0.856 g/cm2 DualFemur Total Right 06/01/2016 79.0 Osteoporosis -2.6 0.679 g/cm2 World Health Organization [facility]) criteria for post-menopausal, Caucasian Women: Normal:       T-score at or above -1 SD Osteopenia:   T-score between -1 and -2.5 SD Osteoporosis: T-score at or below -2.5 SD RECOMMENDATIONS: [place] recommends that FDA-approved medical therapies be considered in postmenopausal women and men age 73 or older with a: 1. Hip or vertebral (clinical or morphometric) fracture. 2. T-score of < -2.5 at the spine or hip. 3. Ten-year fracture probability by FRAX of 3% or greater for hip fracture or 20% or greater for major osteoporotic fracture. All treatment decisions require clinical judgment and consideration of individual patient factors, including patient preferences, co-morbidities, previous drug use, risk factors not captured in the FRAX model (e.g. falls, vitamin D deficiency, increased bone turnover, interval significant decline in bone density) and possible under - or over-estimation of fracture risk by FRAX. All patients should ensure an adequate intake of dietary calcium (1200 mg/d) and vitamin D (800 IU daily) unless contraindicated. FOLLOW-UP: People with diagnosed cases of osteoporosis or at high risk for fracture should have regular bone mineral density tests. For patients  eligible for Medicare, routine testing is allowed once every 2 years. The testing frequency can be increased to one year for patients who have rapidly progressing disease, those who are receiving or discontinuing medical therapy to restore bone mass, or have additional risk factors. I have reviewed this report, and agree with the above findings. [facility] Radiology Electronically Signed   By: Rolm Baptise M.D.   On: 06/01/2016 11:25    ASSESSMENT:   1. Clinical stage IIb ER positive, PR negative, HER-2 overexpressing invasive lobular carcinoma overlapping sites of the left breast, now pathologic stage Ia (T1c,N0,M0). 2. Clinical stage Ia ER/PR positive, HER-2 negative invasive ductal carcinoma of the upper outer quadrant of right breast, now pathologic stage 0. 3. BRCA 1 and 2 negative.  PLAN:    1. Clinical stage IIb ER positive, PR negative, HER-2 overexpressing invasive lobular carcinoma overlapping sites of the left breast, now pathologic stage Ia (T1c,N0,M0): Patient noted to have residual stage IA tumor and her left breast with a complete pathologic response in her right breast. Patient completed 6 cycles of chemotherapy in June 2017. Proceed with cycle 17 of 18 today. MUGA scan from March 28, 2016 reported an EF of 68%, repeat in 3 months. Now that patient has completed her XRT, have recommended initiating an aromatase inhibitor. Patient has not initiated letrozole yet. Return to clinic in 3 weeks for consideration of cycle 18 of Herceptin only. Patient will require a mammogram in the next 1-2 weeks. 2. Clinical stage Ia ER/PR positive, HER-2 negative invasive ductal carcinoma of the upper outer quadrant of right breast, now pathologic stage 0: Complete pathologic response. Patient has completed XRT. Letrozole as above. 3. Pulmonary nodule: CT from December 09, 2015 results reviewed independently with pulmonary lesion that is unchanged. No intervention is needed. Consider repeat CT scan in 6  months.   4. Anemia: Patient's hemoglobin is now within normal limits. She received IV iron in June 2017.  5. Acid Reflux: Continue omeprazole. 6. Osteoporosis: Patient's most recent bone mineral density on June 01, 2016 revealed a T score of -2.7. She was given a prescription for Fosamax today and instructed to continue calcium and vitamin D supplementation. Monitor closely once patient starts an aromatase inhibitor.   Patient expressed understanding and was in agreement with this plan. She also understands that She can call clinic at any time with any questions, concerns, or complaints.    Lloyd Huger, MD 06/20/16 10:20 AM

## 2016-06-20 ENCOUNTER — Inpatient Hospital Stay: Payer: Medicare Other

## 2016-06-20 ENCOUNTER — Inpatient Hospital Stay: Payer: Medicare Other | Attending: Oncology | Admitting: Oncology

## 2016-06-20 ENCOUNTER — Encounter: Payer: Self-pay | Admitting: Oncology

## 2016-06-20 VITALS — BP 161/88 | HR 83 | Temp 97.6°F | Resp 18 | Ht 66.0 in | Wt 156.5 lb

## 2016-06-20 DIAGNOSIS — Z923 Personal history of irradiation: Secondary | ICD-10-CM | POA: Diagnosis not present

## 2016-06-20 DIAGNOSIS — Z79899 Other long term (current) drug therapy: Secondary | ICD-10-CM | POA: Insufficient documentation

## 2016-06-20 DIAGNOSIS — K449 Diaphragmatic hernia without obstruction or gangrene: Secondary | ICD-10-CM | POA: Diagnosis not present

## 2016-06-20 DIAGNOSIS — R609 Edema, unspecified: Secondary | ICD-10-CM | POA: Insufficient documentation

## 2016-06-20 DIAGNOSIS — Z800 Family history of malignant neoplasm of digestive organs: Secondary | ICD-10-CM | POA: Insufficient documentation

## 2016-06-20 DIAGNOSIS — R531 Weakness: Secondary | ICD-10-CM | POA: Diagnosis not present

## 2016-06-20 DIAGNOSIS — C50411 Malignant neoplasm of upper-outer quadrant of right female breast: Secondary | ICD-10-CM | POA: Diagnosis not present

## 2016-06-20 DIAGNOSIS — G4700 Insomnia, unspecified: Secondary | ICD-10-CM | POA: Diagnosis not present

## 2016-06-20 DIAGNOSIS — Z85828 Personal history of other malignant neoplasm of skin: Secondary | ICD-10-CM | POA: Insufficient documentation

## 2016-06-20 DIAGNOSIS — Z170 Estrogen receptor positive status [ER+]: Principal | ICD-10-CM

## 2016-06-20 DIAGNOSIS — C50812 Malignant neoplasm of overlapping sites of left female breast: Secondary | ICD-10-CM | POA: Insufficient documentation

## 2016-06-20 DIAGNOSIS — R918 Other nonspecific abnormal finding of lung field: Secondary | ICD-10-CM | POA: Insufficient documentation

## 2016-06-20 DIAGNOSIS — D649 Anemia, unspecified: Secondary | ICD-10-CM | POA: Diagnosis not present

## 2016-06-20 DIAGNOSIS — G4730 Sleep apnea, unspecified: Secondary | ICD-10-CM | POA: Diagnosis not present

## 2016-06-20 DIAGNOSIS — G629 Polyneuropathy, unspecified: Secondary | ICD-10-CM | POA: Insufficient documentation

## 2016-06-20 DIAGNOSIS — Z8041 Family history of malignant neoplasm of ovary: Secondary | ICD-10-CM | POA: Diagnosis not present

## 2016-06-20 DIAGNOSIS — R5383 Other fatigue: Secondary | ICD-10-CM | POA: Insufficient documentation

## 2016-06-20 DIAGNOSIS — M818 Other osteoporosis without current pathological fracture: Secondary | ICD-10-CM | POA: Diagnosis not present

## 2016-06-20 DIAGNOSIS — K219 Gastro-esophageal reflux disease without esophagitis: Secondary | ICD-10-CM | POA: Insufficient documentation

## 2016-06-20 DIAGNOSIS — Z79811 Long term (current) use of aromatase inhibitors: Secondary | ICD-10-CM | POA: Insufficient documentation

## 2016-06-20 DIAGNOSIS — Z9221 Personal history of antineoplastic chemotherapy: Secondary | ICD-10-CM | POA: Diagnosis not present

## 2016-06-20 LAB — CBC WITH DIFFERENTIAL/PLATELET
BASOS PCT: 1 %
Basophils Absolute: 0 10*3/uL (ref 0–0.1)
Eosinophils Absolute: 0.1 10*3/uL (ref 0–0.7)
Eosinophils Relative: 2 %
HEMATOCRIT: 38.4 % (ref 35.0–47.0)
HEMOGLOBIN: 13.2 g/dL (ref 12.0–16.0)
LYMPHS ABS: 0.9 10*3/uL — AB (ref 1.0–3.6)
Lymphocytes Relative: 22 %
MCH: 32.1 pg (ref 26.0–34.0)
MCHC: 34.3 g/dL (ref 32.0–36.0)
MCV: 93.7 fL (ref 80.0–100.0)
MONOS PCT: 9 %
Monocytes Absolute: 0.4 10*3/uL (ref 0.2–0.9)
NEUTROS ABS: 2.7 10*3/uL (ref 1.4–6.5)
NEUTROS PCT: 66 %
Platelets: 199 10*3/uL (ref 150–440)
RBC: 4.1 MIL/uL (ref 3.80–5.20)
RDW: 13.5 % (ref 11.5–14.5)
WBC: 4.1 10*3/uL (ref 3.6–11.0)

## 2016-06-20 LAB — COMPREHENSIVE METABOLIC PANEL
ALBUMIN: 3.5 g/dL (ref 3.5–5.0)
ALK PHOS: 60 U/L (ref 38–126)
ALT: 23 U/L (ref 14–54)
ANION GAP: 5 (ref 5–15)
AST: 31 U/L (ref 15–41)
BILIRUBIN TOTAL: 1.3 mg/dL — AB (ref 0.3–1.2)
BUN: 24 mg/dL — AB (ref 6–20)
CALCIUM: 9.7 mg/dL (ref 8.9–10.3)
CO2: 25 mmol/L (ref 22–32)
CREATININE: 0.83 mg/dL (ref 0.44–1.00)
Chloride: 110 mmol/L (ref 101–111)
GFR calc Af Amer: >60 mL/min (ref >60)
GFR calc non Af Amer: >60 mL/min (ref >60)
GLUCOSE: 108 mg/dL — AB (ref 65–99)
Potassium: 4.3 mmol/L (ref 3.5–5.1)
Sodium: 140 mmol/L (ref 135–145)
TOTAL PROTEIN: 6.8 g/dL (ref 6.5–8.1)

## 2016-06-20 MED ORDER — HEPARIN SOD (PORK) LOCK FLUSH 100 UNIT/ML IV SOLN
500.0000 [IU] | Freq: Once | INTRAVENOUS | Status: AC | PRN
  Administered 2016-06-20: 500 [IU]
  Filled 2016-06-20: qty 5

## 2016-06-20 MED ORDER — ACETAMINOPHEN 325 MG PO TABS
650.0000 mg | ORAL_TABLET | Freq: Once | ORAL | Status: AC
  Administered 2016-06-20: 650 mg via ORAL
  Filled 2016-06-20: qty 2

## 2016-06-20 MED ORDER — TRASTUZUMAB CHEMO 150 MG IV SOLR
450.0000 mg | Freq: Once | INTRAVENOUS | Status: AC
  Administered 2016-06-20: 450 mg via INTRAVENOUS
  Filled 2016-06-20: qty 21.4

## 2016-06-20 MED ORDER — DIPHENHYDRAMINE HCL 25 MG PO CAPS
25.0000 mg | ORAL_CAPSULE | Freq: Once | ORAL | Status: AC
  Administered 2016-06-20: 25 mg via ORAL
  Filled 2016-06-20: qty 1

## 2016-06-20 MED ORDER — ALENDRONATE SODIUM 70 MG PO TABS: 70.0000 mg | ORAL_TABLET | ORAL | 11 refills | Status: DC

## 2016-06-20 MED ORDER — SODIUM CHLORIDE 0.9 % IV SOLN
Freq: Once | INTRAVENOUS | Status: AC
  Administered 2016-06-20: 11:00:00 via INTRAVENOUS
  Filled 2016-06-20: qty 1000

## 2016-06-20 NOTE — Progress Notes (Signed)
Tolerating treatment, no c/o except for sore throat from sinus drainage

## 2016-07-07 ENCOUNTER — Ambulatory Visit
Admission: RE | Admit: 2016-07-07 | Discharge: 2016-07-07 | Disposition: A | Discharge status: Home / Self Care | Payer: Medicare Other | Source: Ambulatory Visit | Attending: Oncology | Admitting: Oncology

## 2016-07-07 DIAGNOSIS — Z170 Estrogen receptor positive status [ER+]: Secondary | ICD-10-CM | POA: Diagnosis present

## 2016-07-07 DIAGNOSIS — C50411 Malignant neoplasm of upper-outer quadrant of right female breast: Secondary | ICD-10-CM

## 2016-07-10 NOTE — Progress Notes (Signed)
_ [place]  Telephone:([phone] [phone]  ID: Stephanie Crosby OB: [birth year]  MR#: [id number]  ENM#:[id number]  [name], MD as PCP - General (Family Medicine) [name], MD as Consulting Physician (General Surgery)  CHIEF COMPLAINT:   1. Clinical stage IIb ER positive, PR negative, HER-2 overexpressing invasive lobular carcinoma overlapping sites of the left breast, now pathologic stage Ia (T1c,N0,M0). 2. Clinical stage Ia ER/PR positive, HER-2 negative invasive ductal carcinoma of the upper outer quadrant of right breast, now pathologic stage 0.  INTERVAL HISTORY: Patient returns to clinic today for further evaluation and consideration of cycle 18 of Herceptin only. She currently feels well and is asymptomatic. She has not initiated letrozole yet. She complains of consistent peripheral neuropathy in bilateral hands and feet. She has no other neurologic complaints. She has stable mild edema in both lower legs.  She denies any fevers. She has no chest pain or shortness of breath.  She denies nausea, vomiting, diarrhea, or constipation. She has no urinary complaints. Patient offers no further specific complaints today.  REVIEW OF SYSTEMS:   Review of Systems  Constitutional: Negative for fever, malaise/fatigue and weight loss.  HENT: Negative for sore throat.   Eyes: Negative for discharge and redness.  Respiratory: Negative.  Negative for shortness of breath.   Cardiovascular: Positive for leg swelling. Negative for chest pain.  Gastrointestinal: Negative for abdominal pain, diarrhea, nausea and vomiting.  Musculoskeletal: Negative.   Skin: Negative.   Neurological: Positive for tingling and sensory change (Mild neuropathy ). Negative for weakness and headaches.  Psychiatric/Behavioral: The patient is not nervous/anxious and does not have insomnia.    As per HPI. Otherwise, a complete review of systems is  negative.   PAST MEDICAL HISTORY: Past Medical History:  Diagnosis Date  . Anemia   . Back injury   . Bilateral breast cancer ([place]) 05/2015   Chemo tx's.  . Breast cancer ([place]) 2017  . Breast cancer in situ 05/2015   Left  . Edema of leg   . GERD (gastroesophageal reflux disease)   . Gonalgia   . Hiatal hernia   . Seasonal allergic rhinitis   . Sleep apnea   . Squamous cell skin cancer 2011   resected from left side of nose and Right axilla area.   . Tearing eyes    SINCE TAKING CHEMO    PAST SURGICAL HISTORY: Past Surgical History:  Procedure Laterality Date  . APPENDECTOMY    . AXILLARY SENTINEL NODE BIOPSY Bilateral 12/10/2015   Procedure: BILATERAL SENTINEL NODE BIOPSY, SENTINNEL NODE INJ.;  Surgeon: [name], MD;  Location: [facility] ORS;  Service: General;  Laterality: Bilateral;  . BREAST BIOPSY Bilateral 11/17/2015   Procedure: BREAST BIOPSY WITH NEEDLE LOCALIZATION;  Surgeon: [name], MD;  Location: [facility] ORS;  Service: General;  Laterality: Bilateral;  . BREAST LUMPECTOMY Bilateral 05/2015   01/17  . CATARACT EXTRACTION, BILATERAL Bilateral 2012  . COLONOSCOPY    . PORTACATH PLACEMENT Right 06/10/2015   Procedure: INSERTION PORT-A-CATH;  Surgeon: [name], MD;  Location: [facility] ORS;  Service: General;  Laterality: Right;    FAMILY HISTORY: Patient reports a maternal aunt with breast cancer as well as a female cousin with breast cancer.  Family History  Problem Relation Age of Onset  . Dementia Mother   . Hyperlipidemia Mother   . Hypertension Mother   . Congestive Heart Failure Mother   . Heart disease Mother   . Aneurysm Father  abdominal  . Fibromyalgia Sister   . Heart disease Sister   . Cancer Maternal Uncle     brain  . Heart disease Maternal Uncle   . Pancreatic cancer Maternal Grandmother   . Cancer Cousin     female cousin with breast cancer and brain cancer  . Ovarian cancer Maternal Aunt   . Colon cancer Maternal Aunt   .  Breast cancer Neg Hx        ADVANCED DIRECTIVES:    HEALTH MAINTENANCE: Social History  Substance Use Topics  . Smoking status: Never Smoker  . Smokeless tobacco: Never Used  . Alcohol use 0.0 oz/week     Comment: 3 glasses wine/week      Allergies  Allergen Reactions  . Penicillins Rash    Has patient had a PCN reaction causing immediate rash, facial/tongue/throat swelling, SOB or lightheadedness with hypotension: unsure Has patient had a PCN reaction causing severe rash involving mucus membranes or skin necrosis: no Has patient had a PCN reaction that required hospitalization no Has patient had a PCN reaction occurring within the last 10 years: no If all of the above answers are "NO", then may proceed with Cephalosporin use.    Current Outpatient Prescriptions  Medication Sig Dispense Refill  . acetaminophen (TYLENOL) 500 MG tablet Take 500 mg by mouth every 6 (six) hours as needed for mild pain.    Marland Kitchen alendronate (FOSAMAX) 70 MG tablet Take 1 tablet (70 mg total) by mouth once a week. Take with a full glass of water on an empty stomach. 4 tablet 11  . lidocaine-prilocaine (EMLA) cream Apply 1 application topically as needed (for chemo therapy port access).     Marland Kitchen loratadine (CLARITIN) 10 MG tablet Take 10 mg by mouth every other day. Takes when needed    . Multiple Vitamins-Minerals (CENTRUM SILVER PO) Take 1 tablet by mouth daily. Reported on 06/15/2015    . omeprazole (PRILOSEC) 20 MG capsule Take 1 capsule by mouth every morning. Reported on 06/15/2015    . traMADol (ULTRAM) 50 MG tablet Take 1 tablet (50 mg total) by mouth every 8 (eight) hours as needed. (Patient taking differently: Take 50 mg by mouth every 8 (eight) hours as needed for moderate pain. ) 20 tablet 0  . valACYclovir (VALTREX) 500 MG tablet Take 500 mg by mouth daily as needed (break outs). Reported on 06/15/2015    . letrozole (FEMARA) 2.5 MG tablet Take 1 tablet (2.5 mg total) by mouth daily. (Patient not  taking: Reported on 06/20/2016) 30 tablet 6   No current facility-administered medications for this visit.    Facility-Administered Medications Ordered in Other Visits  Medication Dose Route Frequency Provider Last Rate Last Dose  . heparin lock flush 100 unit/mL  500 Units Intravenous Once Lloyd Huger, MD        OBJECTIVE: Vitals:   07/11/16 0937  BP: (!) 145/98  Pulse: 90  Resp: 18  Temp: (!) 96.1 F (35.6 C)     Body mass index is 25.32 kg/m.    ECOG FS:0 - Asymptomatic  General: Well-developed, well-nourished, no acute distress. Eyes: Pink conjunctiva, anicteric sclera. Breasts: Exam deferred today. Lungs: Clear to auscultation bilaterally. Heart: Regular rate and rhythm. No rubs, murmurs, or gallops. Abdomen: Soft, nontender, nondistended. No organomegaly noted, normoactive bowel sounds. Musculoskeletal: Non-pitting edema in bilateral lower extremities. No cyanosis, or clubbing. Neuro: Alert, answering all questions appropriately. Cranial nerves grossly intact. Skin: ecchymosis right breast. Psych: Normal affect.   LAB  RESULTS:  Lab Results  Component Value Date   NA 139 07/11/2016   K 4.0 07/11/2016   CL 107 07/11/2016   CO2 26 07/11/2016   GLUCOSE 116 (H) 07/11/2016   BUN 25 (H) 07/11/2016   CREATININE 0.67 07/11/2016   CALCIUM 9.5 07/11/2016   PROT 7.0 07/11/2016   ALBUMIN 3.7 07/11/2016   AST 29 07/11/2016   ALT 20 07/11/2016   ALKPHOS 59 07/11/2016   BILITOT 1.2 07/11/2016   GFRNONAA >60 07/11/2016   GFRAA >60 07/11/2016    Lab Results  Component Value Date   WBC 3.7 07/11/2016   NEUTROABS 2.0 07/11/2016   HGB 13.8 07/11/2016   HCT 40.4 07/11/2016   MCV 93.5 07/11/2016   PLT 195 07/11/2016   Lab Results  Component Value Date   IRON 122 03/14/2016   TIBC 298 03/14/2016   IRONPCTSAT 41 (H) 03/14/2016   Lab Results  Component Value Date   FERRITIN 61 03/14/2016     STUDIES: Mm Diag Breast Tomo Bilateral  Result Date:  07/07/2016 CLINICAL DATA:  81 year old female status post left lumpectomy for invasive lobular carcinoma and right lumpectomy for invasive ductal carcinoma in July 2017. The patient received radiation bilaterally. EXAM: 2D DIGITAL DIAGNOSTIC BILATERAL MAMMOGRAM WITH CAD AND ADJUNCT TOMO COMPARISON:  Previous exam(s). ACR Breast Density Category c: The breast tissue is heterogeneously dense, which may obscure small masses. FINDINGS: New postlumpectomy changes are seen of the upper, outer right breast and superior left breast. No suspicious mass, calcifications, or other abnormality is identified within either breast. Mammographic images were processed with CAD. IMPRESSION: No mammographic evidence of malignancy. RECOMMENDATION: Bilateral diagnostic mammogram in 1 year. I have discussed the findings and recommendations with the patient. Results were also provided in writing at the conclusion of the visit. If applicable, a reminder letter will be sent to the patient regarding the next appointment. BI-RADS CATEGORY  2: Benign. Electronically Signed   By: Pamelia Hoit M.D.   On: 07/07/2016 16:05    ASSESSMENT:   1. Clinical stage IIb ER positive, PR negative, HER-2 overexpressing invasive lobular carcinoma overlapping sites of the left breast, now pathologic stage Ia (T1c,N0,M0). 2. Clinical stage Ia ER/PR positive, HER-2 negative invasive ductal carcinoma of the upper outer quadrant of right breast, now pathologic stage 0. 3. BRCA 1 and 2 negative.  PLAN:    1. Clinical stage IIb ER positive, PR negative, HER-2 overexpressing invasive lobular carcinoma overlapping sites of the left breast, now pathologic stage Ia (T1c,N0,M0): Patient noted to have residual stage IA tumor and her left breast with a complete pathologic response in her right breast. Patient completed 6 cycles of chemotherapy in June 2017. Proceed with cycle 18 of 18 of Herceptin today. MUGA scan from March 28, 2016 reported an EF of 68%. Now  that patient has completed her XRT, have recommended initiating an aromatase inhibitor. Patient has not initiated letrozole yet, but has stated she will the next 1-2 weeks. Patient's most recent mammogram on July 07, 2016 was reported as BI-RADS 2. Return to clinic in 3 months for routine evaluation.  2. Clinical stage Ia ER/PR positive, HER-2 negative invasive ductal carcinoma of the upper outer quadrant of right breast, now pathologic stage 0: Complete pathologic response. Patient has completed XRT. Letrozole as above. 3. Pulmonary nodule: CT from December 09, 2015 results reviewed independently with pulmonary lesion that is unchanged. No intervention is needed. Consider repeat CT scan in August 2018.   4. Anemia: Patient's hemoglobin  is now within normal limits. She received IV iron in June 2017.  5. Acid Reflux: Continue omeprazole. 6. Osteoporosis: Patient's most recent bone mineral density on June 01, 2016 revealed a T score of -2.7. She was given a prescription for Fosamax today and instructed to continue calcium and vitamin D supplementation. Monitor closely once patient starts an aromatase inhibitor. 7. Port: Patient will maintain her port at this time, but states she will likely have a removed in June which will be one year removed from completing her chemotherapy.   Patient expressed understanding and was in agreement with this plan. She also understands that She can call clinic at any time with any questions, concerns, or complaints.    Lloyd Huger, MD 07/11/16 10:06 AM

## 2016-07-11 ENCOUNTER — Inpatient Hospital Stay: Payer: Medicare Other | Attending: Oncology | Admitting: Oncology

## 2016-07-11 ENCOUNTER — Inpatient Hospital Stay: Payer: Medicare Other

## 2016-07-11 VITALS — BP 145/98 | HR 90 | Temp 96.1°F | Resp 18 | Wt 156.9 lb

## 2016-07-11 DIAGNOSIS — G4730 Sleep apnea, unspecified: Secondary | ICD-10-CM | POA: Insufficient documentation

## 2016-07-11 DIAGNOSIS — Z85828 Personal history of other malignant neoplasm of skin: Secondary | ICD-10-CM | POA: Diagnosis not present

## 2016-07-11 DIAGNOSIS — R609 Edema, unspecified: Secondary | ICD-10-CM | POA: Diagnosis not present

## 2016-07-11 DIAGNOSIS — Z170 Estrogen receptor positive status [ER+]: Secondary | ICD-10-CM | POA: Insufficient documentation

## 2016-07-11 DIAGNOSIS — K219 Gastro-esophageal reflux disease without esophagitis: Secondary | ICD-10-CM

## 2016-07-11 DIAGNOSIS — C50812 Malignant neoplasm of overlapping sites of left female breast: Secondary | ICD-10-CM | POA: Diagnosis not present

## 2016-07-11 DIAGNOSIS — R918 Other nonspecific abnormal finding of lung field: Secondary | ICD-10-CM

## 2016-07-11 DIAGNOSIS — Z800 Family history of malignant neoplasm of digestive organs: Secondary | ICD-10-CM | POA: Diagnosis not present

## 2016-07-11 DIAGNOSIS — M818 Other osteoporosis without current pathological fracture: Secondary | ICD-10-CM | POA: Diagnosis not present

## 2016-07-11 DIAGNOSIS — Z923 Personal history of irradiation: Secondary | ICD-10-CM | POA: Diagnosis not present

## 2016-07-11 DIAGNOSIS — G629 Polyneuropathy, unspecified: Secondary | ICD-10-CM | POA: Insufficient documentation

## 2016-07-11 DIAGNOSIS — Z5112 Encounter for antineoplastic immunotherapy: Secondary | ICD-10-CM | POA: Insufficient documentation

## 2016-07-11 DIAGNOSIS — K449 Diaphragmatic hernia without obstruction or gangrene: Secondary | ICD-10-CM | POA: Diagnosis not present

## 2016-07-11 DIAGNOSIS — Z8041 Family history of malignant neoplasm of ovary: Secondary | ICD-10-CM | POA: Insufficient documentation

## 2016-07-11 DIAGNOSIS — Z803 Family history of malignant neoplasm of breast: Secondary | ICD-10-CM | POA: Insufficient documentation

## 2016-07-11 DIAGNOSIS — C50411 Malignant neoplasm of upper-outer quadrant of right female breast: Secondary | ICD-10-CM | POA: Insufficient documentation

## 2016-07-11 DIAGNOSIS — Z79899 Other long term (current) drug therapy: Secondary | ICD-10-CM | POA: Diagnosis not present

## 2016-07-11 LAB — COMPREHENSIVE METABOLIC PANEL
ALK PHOS: 59 U/L (ref 38–126)
ALT: 20 U/L (ref 14–54)
ANION GAP: 6 (ref 5–15)
AST: 29 U/L (ref 15–41)
Albumin: 3.7 g/dL (ref 3.5–5.0)
BILIRUBIN TOTAL: 1.2 mg/dL (ref 0.3–1.2)
BUN: 25 mg/dL — ABNORMAL HIGH (ref 6–20)
CALCIUM: 9.5 mg/dL (ref 8.9–10.3)
CO2: 26 mmol/L (ref 22–32)
Chloride: 107 mmol/L (ref 101–111)
Creatinine, Ser: 0.67 mg/dL (ref 0.44–1.00)
GFR calc Af Amer: >60 mL/min (ref >60)
Glucose, Bld: 116 mg/dL — ABNORMAL HIGH (ref 65–99)
POTASSIUM: 4 mmol/L (ref 3.5–5.1)
Sodium: 139 mmol/L (ref 135–145)
TOTAL PROTEIN: 7 g/dL (ref 6.5–8.1)

## 2016-07-11 LAB — CBC WITH DIFFERENTIAL/PLATELET
BASOS ABS: 0 10*3/uL (ref 0–0.1)
BASOS PCT: 1 %
EOS PCT: 3 %
Eosinophils Absolute: 0.1 10*3/uL (ref 0–0.7)
HEMATOCRIT: 40.4 % (ref 35.0–47.0)
Hemoglobin: 13.8 g/dL (ref 12.0–16.0)
LYMPHS PCT: 32 %
Lymphs Abs: 1.2 10*3/uL (ref 1.0–3.6)
MCH: 32.1 pg (ref 26.0–34.0)
MCHC: 34.3 g/dL (ref 32.0–36.0)
MCV: 93.5 fL (ref 80.0–100.0)
MONO ABS: 0.3 10*3/uL (ref 0.2–0.9)
Monocytes Relative: 9 %
NEUTROS ABS: 2 10*3/uL (ref 1.4–6.5)
Neutrophils Relative %: 55 %
Platelets: 195 10*3/uL (ref 150–440)
RBC: 4.32 MIL/uL (ref 3.80–5.20)
RDW: 14 % (ref 11.5–14.5)
WBC: 3.7 10*3/uL (ref 3.6–11.0)

## 2016-07-11 MED ORDER — SODIUM CHLORIDE 0.9% FLUSH
10.0000 mL | Freq: Once | INTRAVENOUS | Status: AC
  Administered 2016-07-11: 10 mL via INTRAVENOUS
  Filled 2016-07-11: qty 10

## 2016-07-11 MED ORDER — DIPHENHYDRAMINE HCL 25 MG PO CAPS
25.0000 mg | ORAL_CAPSULE | Freq: Once | ORAL | Status: AC
  Administered 2016-07-11: 25 mg via ORAL
  Filled 2016-07-11: qty 1

## 2016-07-11 MED ORDER — TRASTUZUMAB CHEMO INJECTION 440 MG: 6.0000 mg/kg | Freq: Once | INTRAVENOUS | Status: DC

## 2016-07-11 MED ORDER — HEPARIN SOD (PORK) LOCK FLUSH 100 UNIT/ML IV SOLN
500.0000 [IU] | Freq: Once | INTRAVENOUS | Status: AC
  Administered 2016-07-11: 500 [IU] via INTRAVENOUS
  Filled 2016-07-11: qty 5

## 2016-07-11 MED ORDER — TRASTUZUMAB CHEMO 150 MG IV SOLR
450.0000 mg | Freq: Once | INTRAVENOUS | Status: AC
  Administered 2016-07-11: 450 mg via INTRAVENOUS
  Filled 2016-07-11: qty 21.43

## 2016-07-11 MED ORDER — ACETAMINOPHEN 325 MG PO TABS
650.0000 mg | ORAL_TABLET | Freq: Once | ORAL | Status: AC
  Administered 2016-07-11: 650 mg via ORAL
  Filled 2016-07-11: qty 2

## 2016-07-11 MED ORDER — SODIUM CHLORIDE 0.9 % IV SOLN
Freq: Once | INTRAVENOUS | Status: AC
  Administered 2016-07-11: 10:00:00 via INTRAVENOUS
  Filled 2016-07-11: qty 1000

## 2016-07-11 NOTE — Progress Notes (Signed)
Offers no complaints. States is feeling well. 

## 2016-08-22 ENCOUNTER — Inpatient Hospital Stay: Payer: Medicare Other | Attending: Oncology

## 2016-08-22 DIAGNOSIS — Z452 Encounter for adjustment and management of vascular access device: Secondary | ICD-10-CM | POA: Insufficient documentation

## 2016-08-22 DIAGNOSIS — Z170 Estrogen receptor positive status [ER+]: Secondary | ICD-10-CM | POA: Insufficient documentation

## 2016-08-22 DIAGNOSIS — Z95828 Presence of other vascular implants and grafts: Secondary | ICD-10-CM

## 2016-08-22 DIAGNOSIS — C50411 Malignant neoplasm of upper-outer quadrant of right female breast: Secondary | ICD-10-CM | POA: Diagnosis not present

## 2016-08-22 MED ORDER — HEPARIN SOD (PORK) LOCK FLUSH 100 UNIT/ML IV SOLN
500.0000 [IU] | Freq: Once | INTRAVENOUS | Status: AC
  Administered 2016-08-22: 500 [IU] via INTRAVENOUS

## 2016-08-22 MED ORDER — SODIUM CHLORIDE 0.9% FLUSH
10.0000 mL | Freq: Once | INTRAVENOUS | Status: AC
  Administered 2016-08-22: 10 mL via INTRAVENOUS
  Filled 2016-08-22: qty 10

## 2016-09-07 ENCOUNTER — Ambulatory Visit: Payer: Medicare Other | Admitting: Radiation Oncology

## 2016-09-22 ENCOUNTER — Ambulatory Visit
Admission: RE | Admit: 2016-09-22 | Discharge: 2016-09-22 | Disposition: A | Discharge status: Home / Self Care | Payer: Medicare Other | Source: Ambulatory Visit | Attending: Radiation Oncology | Admitting: Radiation Oncology

## 2016-09-22 ENCOUNTER — Encounter: Payer: Self-pay | Admitting: Radiation Oncology

## 2016-09-22 VITALS — BP 124/88 | HR 94 | Temp 98.0°F | Resp 18 | Wt 159.8 lb

## 2016-09-22 DIAGNOSIS — Z923 Personal history of irradiation: Secondary | ICD-10-CM | POA: Diagnosis not present

## 2016-09-22 DIAGNOSIS — C50812 Malignant neoplasm of overlapping sites of left female breast: Secondary | ICD-10-CM | POA: Diagnosis not present

## 2016-09-22 DIAGNOSIS — C50411 Malignant neoplasm of upper-outer quadrant of right female breast: Secondary | ICD-10-CM

## 2016-09-22 DIAGNOSIS — Z170 Estrogen receptor positive status [ER+]: Secondary | ICD-10-CM | POA: Insufficient documentation

## 2016-09-22 DIAGNOSIS — Z79811 Long term (current) use of aromatase inhibitors: Secondary | ICD-10-CM | POA: Insufficient documentation

## 2016-09-22 DIAGNOSIS — D0511 Intraductal carcinoma in situ of right breast: Secondary | ICD-10-CM | POA: Diagnosis not present

## 2016-09-22 NOTE — Progress Notes (Signed)
Radiation Oncology Follow up Note  Name: Stephanie Crosby   Date:   09/22/2016 MRN:  [id number] DOB: May 09, 1935    This 81 y.o. female presents to the clinic today for 6 month follow-up status post bilateral radiation therapy for bilateral breast cancer.  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 Seen today in routine follow-up she is doing well.. She has been on Femara taking although she appears to be taken that infrequently. Her last mammograms which I have reviewed were performed in March 2018 BI-RADS 2 benign. She specifically denies breast tenderness cough or bone pain.  COMPLICATIONS OF TREATMENT: none  FOLLOW UP COMPLIANCE: keeps appointments   PHYSICAL EXAM:  BP 124/88   Pulse 94   Temp 98 F (36.7 C)   Resp 18   Wt 159 lb 13.3 oz (72.5 kg)   BMI 25.80 kg/m  Lungs are clear to A&P cardiac examination essentially unremarkable with regular rate and rhythm. No dominant mass or nodularity is noted in either breast in 2 positions examined. Incision is well-healed. No axillary or supraclavicular adenopathy is appreciated. Cosmetic result is excellent. Well-developed well-nourished patient in NAD. HEENT reveals PERLA, EOMI, discs not visualized.  Oral cavity is clear. No oral mucosal lesions are identified. Neck is clear without evidence of cervical or supraclavicular adenopathy. Lungs are clear to A&P. Cardiac examination is essentially unremarkable with regular rate and rhythm without murmur rub or thrill. Abdomen is benign with no organomegaly or masses noted. Motor sensory and DTR levels are equal and symmetric in the upper and lower extremities. Cranial nerves II through XII are grossly intact. Proprioception is  intact. No peripheral adenopathy or edema is identified. No motor or sensory levels are noted. Crude visual fields are within normal range.  RADIOLOGY RESULTS: Mammograms reviewed and compatible with the above-stated findings  PLAN: Present time she continues to do well with no evidence of disease. I'm please were overall progress. She continues on Femara. I've instructed her that she needs to take it regularly. Otherwise she is scheduled for follow-up mammograms next March. I will see her back in 6 months for follow-up and then start once your follow-up appointments.  I would like to take this opportunity to thank you for allowing me to participate in the care of your patient.Armstead Peaks., MD

## 2016-10-03 ENCOUNTER — Inpatient Hospital Stay: Payer: Medicare Other | Attending: Oncology

## 2016-10-03 DIAGNOSIS — Z452 Encounter for adjustment and management of vascular access device: Secondary | ICD-10-CM | POA: Diagnosis not present

## 2016-10-03 DIAGNOSIS — G4730 Sleep apnea, unspecified: Secondary | ICD-10-CM | POA: Insufficient documentation

## 2016-10-03 DIAGNOSIS — Z170 Estrogen receptor positive status [ER+]: Secondary | ICD-10-CM | POA: Insufficient documentation

## 2016-10-03 DIAGNOSIS — Z923 Personal history of irradiation: Secondary | ICD-10-CM | POA: Diagnosis not present

## 2016-10-03 DIAGNOSIS — K449 Diaphragmatic hernia without obstruction or gangrene: Secondary | ICD-10-CM | POA: Insufficient documentation

## 2016-10-03 DIAGNOSIS — Z95828 Presence of other vascular implants and grafts: Secondary | ICD-10-CM

## 2016-10-03 DIAGNOSIS — K219 Gastro-esophageal reflux disease without esophagitis: Secondary | ICD-10-CM | POA: Insufficient documentation

## 2016-10-03 DIAGNOSIS — G629 Polyneuropathy, unspecified: Secondary | ICD-10-CM | POA: Diagnosis not present

## 2016-10-03 DIAGNOSIS — Z85828 Personal history of other malignant neoplasm of skin: Secondary | ICD-10-CM | POA: Insufficient documentation

## 2016-10-03 DIAGNOSIS — R918 Other nonspecific abnormal finding of lung field: Secondary | ICD-10-CM | POA: Insufficient documentation

## 2016-10-03 DIAGNOSIS — M810 Age-related osteoporosis without current pathological fracture: Secondary | ICD-10-CM | POA: Insufficient documentation

## 2016-10-03 DIAGNOSIS — C50411 Malignant neoplasm of upper-outer quadrant of right female breast: Secondary | ICD-10-CM | POA: Insufficient documentation

## 2016-10-03 MED ORDER — SODIUM CHLORIDE 0.9% FLUSH
10.0000 mL | INTRAVENOUS | Status: DC | PRN
  Administered 2016-10-03: 10 mL via INTRAVENOUS
  Filled 2016-10-03: qty 10

## 2016-10-03 MED ORDER — HEPARIN SOD (PORK) LOCK FLUSH 100 UNIT/ML IV SOLN
500.0000 [IU] | Freq: Once | INTRAVENOUS | Status: AC
  Administered 2016-10-03: 500 [IU] via INTRAVENOUS

## 2016-10-11 ENCOUNTER — Ambulatory Visit: Payer: Medicare Other | Admitting: Oncology

## 2016-10-11 ENCOUNTER — Other Ambulatory Visit: Payer: Medicare Other

## 2016-10-15 NOTE — Progress Notes (Signed)
_ [place]  [phone] [phone]  ID: Stephanie Crosby OB: April 16, 1936  MR#: [id number]  SHF#:[id number]  [name], MD as PCP - General (Family Medicine) [name], MD as Consulting Physician (General Surgery)  CHIEF COMPLAINT:   1. Clinical stage IIb ER positive, PR negative, HER-2 overexpressing invasive lobular carcinoma overlapping sites of the left breast, now pathologic stage Ia (T1c,N0,M0). 2. Clinical stage Ia ER/PR positive, HER-2 negative invasive ductal carcinoma of the upper outer quadrant of right breast, now pathologic stage 0.  INTERVAL HISTORY: Patient returns to clinic today for routine 3 month evaluation. She has initiated letrozole and Fosamax and is tolerating both well without significant side effects. She currently feels well and is asymptomatic. She complains of persistent peripheral neuropathy in bilateral hands and feet. She has no other neurologic complaints. She has stable mild edema in both lower legs.  She denies any fevers. She has no chest pain or shortness of breath.  She denies nausea, vomiting, diarrhea, or constipation. She has no urinary complaints. Patient offers no further specific complaints today.  REVIEW OF SYSTEMS:   Review of Systems  Constitutional: Negative for fever, malaise/fatigue and weight loss.  HENT: Negative for sore throat.   Eyes: Negative for discharge and redness.  Respiratory: Negative.  Negative for shortness of breath.   Cardiovascular: Positive for leg swelling. Negative for chest pain.  Gastrointestinal: Negative for abdominal pain, diarrhea, nausea and vomiting.  Musculoskeletal: Negative.   Skin: Negative.   Neurological: Positive for tingling and sensory change (Mild neuropathy ). Negative for weakness and headaches.  Psychiatric/Behavioral: The patient is not nervous/anxious and does not have insomnia.    As per HPI. Otherwise, a complete review of  systems is negative.   PAST MEDICAL HISTORY: Past Medical History:  Diagnosis Date  . Anemia   . Back injury   . Bilateral breast cancer ([place]) 05/2015   Chemo tx's.  . Breast cancer ([place]) 2017  . Breast cancer in situ 05/2015   Left  . Edema of leg   . GERD (gastroesophageal reflux disease)   . Gonalgia   . Hiatal hernia   . Seasonal allergic rhinitis   . Sleep apnea   . Squamous cell skin cancer 2011   resected from left side of nose and Right axilla area.   . Tearing eyes    SINCE TAKING CHEMO    PAST SURGICAL HISTORY: Past Surgical History:  Procedure Laterality Date  . APPENDECTOMY    . AXILLARY SENTINEL NODE BIOPSY Bilateral 12/10/2015   Procedure: BILATERAL SENTINEL NODE BIOPSY, SENTINNEL NODE INJ.;  Surgeon: [name], MD;  Location: [facility] ORS;  Service: General;  Laterality: Bilateral;  . BREAST BIOPSY Bilateral 11/17/2015   Procedure: BREAST BIOPSY WITH NEEDLE LOCALIZATION;  Surgeon: [name], MD;  Location: [facility] ORS;  Service: General;  Laterality: Bilateral;  . BREAST LUMPECTOMY Bilateral 05/2015   01/17  . CATARACT EXTRACTION, BILATERAL Bilateral 2012  . COLONOSCOPY    . PORTACATH PLACEMENT Right 06/10/2015   Procedure: INSERTION PORT-A-CATH;  Surgeon: [name], MD;  Location: [facility] ORS;  Service: General;  Laterality: Right;    FAMILY HISTORY: Patient reports a maternal aunt with breast cancer as well as a female cousin with breast cancer.  Family History  Problem Relation Age of Onset  . Dementia Mother   . Hyperlipidemia Mother   . Hypertension Mother   . Congestive Heart Failure Mother   . Heart disease Mother   .  Aneurysm Father        abdominal  . Fibromyalgia Sister   . Heart disease Sister   . Cancer Maternal Uncle        brain  . Heart disease Maternal Uncle   . Pancreatic cancer Maternal Grandmother   . Cancer Cousin        female cousin with breast cancer and brain cancer  . Ovarian cancer Maternal Aunt   . Colon cancer  Maternal Aunt   . Breast cancer Neg Hx        ADVANCED DIRECTIVES:    HEALTH MAINTENANCE: Social History  Substance Use Topics  . Smoking status: Never Smoker  . Smokeless tobacco: Never Used  . Alcohol use 0.0 oz/week     Comment: 3 glasses wine/week      Allergies  Allergen Reactions  . Penicillins Rash    Has patient had a PCN reaction causing immediate rash, facial/tongue/throat swelling, SOB or lightheadedness with hypotension: unsure Has patient had a PCN reaction causing severe rash involving mucus membranes or skin necrosis: no Has patient had a PCN reaction that required hospitalization no Has patient had a PCN reaction occurring within the last 10 years: no If all of the above answers are "NO", then may proceed with Cephalosporin use.    Current Outpatient Prescriptions  Medication Sig Dispense Refill  . acetaminophen (TYLENOL) 500 MG tablet Take 500 mg by mouth every 6 (six) hours as needed for mild pain.    Marland Kitchen albuterol (PROVENTIL HFA;VENTOLIN HFA) 108 (90 Base) MCG/ACT inhaler Inhale into the lungs.    Marland Kitchen alendronate (FOSAMAX) 70 MG tablet Take 1 tablet (70 mg total) by mouth once a week. Take with a full glass of water on an empty stomach. 4 tablet 11  . fluticasone (FLONASE) 50 MCG/ACT nasal spray Place into both nostrils daily.    Marland Kitchen letrozole (FEMARA) 2.5 MG tablet Take 1 tablet (2.5 mg total) by mouth daily. 30 tablet 6  . lidocaine-prilocaine (EMLA) cream Apply 1 application topically as needed (for chemo therapy port access).     . Multiple Vitamins-Minerals (CENTRUM SILVER PO) Take 1 tablet by mouth daily. Reported on 06/15/2015    . omeprazole (PRILOSEC) 20 MG capsule Take 1 capsule by mouth every morning. Reported on 06/15/2015    . valACYclovir (VALTREX) 500 MG tablet Take 500 mg by mouth daily as needed (break outs). Reported on 06/15/2015     No current facility-administered medications for this visit.     OBJECTIVE: Vitals:   10/17/16 1152  BP:  95/66  Pulse: 93  Resp: 16  Temp: 98.1 F (36.7 C)     Body mass index is 25.91 kg/m.    ECOG FS:0 - Asymptomatic  General: Well-developed, well-nourished, no acute distress. Eyes: Pink conjunctiva, anicteric sclera. Breasts: Exam deferred today. Lungs: Clear to auscultation bilaterally. Heart: Regular rate and rhythm. No rubs, murmurs, or gallops. Abdomen: Soft, nontender, nondistended. No organomegaly noted, normoactive bowel sounds. Musculoskeletal: Non-pitting edema in bilateral lower extremities. No cyanosis, or clubbing. Neuro: Alert, answering all questions appropriately. Cranial nerves grossly intact. Skin: ecchymosis right breast. Psych: Normal affect.   LAB RESULTS:  Lab Results  Component Value Date   NA 138 10/17/2016   K 4.4 10/17/2016   CL 104 10/17/2016   CO2 25 10/17/2016   GLUCOSE 114 (H) 10/17/2016   BUN 21 (H) 10/17/2016   CREATININE 0.80 10/17/2016   CALCIUM 9.6 10/17/2016   PROT 7.0 10/17/2016   ALBUMIN 3.9 10/17/2016  AST 24 10/17/2016   ALT 18 10/17/2016   ALKPHOS 48 10/17/2016   BILITOT 1.4 (H) 10/17/2016   GFRNONAA >60 10/17/2016   GFRAA >60 10/17/2016    Lab Results  Component Value Date   WBC 3.3 (L) 10/17/2016   NEUTROABS 1.9 10/17/2016   HGB 14.4 10/17/2016   HCT 41.8 10/17/2016   MCV 91.0 10/17/2016   PLT 215 10/17/2016   Lab Results  Component Value Date   IRON 122 03/14/2016   TIBC 298 03/14/2016   IRONPCTSAT 41 (H) 03/14/2016   Lab Results  Component Value Date   FERRITIN 61 03/14/2016     STUDIES: No results found.  ASSESSMENT:   1. Clinical stage IIb ER positive, PR negative, HER-2 overexpressing invasive lobular carcinoma overlapping sites of the left breast, now pathologic stage Ia (T1c,N0,M0). 2. Clinical stage Ia ER/PR positive, HER-2 negative invasive ductal carcinoma of the upper outer quadrant of right breast, now pathologic stage 0. 3. BRCA 1 and 2 negative.  PLAN:    1. Clinical stage IIb ER  positive, PR negative, HER-2 overexpressing invasive lobular carcinoma overlapping sites of the left breast, now pathologic stage Ia (T1c,N0,M0): Patient noted to have residual stage IA tumor and her left breast with a complete pathologic response in her right breast. Patient completed 6 cycles of chemotherapy in June 2017 and year-long Herceptin maintenance on July 11, 2016. Patient initiated letrozole in March 2018 which she will take for 5 years completing in March 2023. Patient's most recent mammogram on July 07, 2016 was reported as BI-RADS 2. Return to clinic in 3 months for routine evaluation. Return to clinic in 3 months for further evaluation. 2. Clinical stage Ia ER/PR positive, HER-2 negative invasive ductal carcinoma of the upper outer quadrant of right breast, now pathologic stage 0: Complete pathologic response. Patient has completed XRT. Letrozole as above. 3. Pulmonary nodule: CT from December 09, 2015 results reviewed independently with pulmonary lesion that is unchanged. No intervention is needed. Consider repeat CT scan in August 2018.   4. Anemia: Patient's hemoglobin is now within normal limits. She received IV iron in June 2017.  5. Acid Reflux: Continue omeprazole. 6. Osteoporosis: Patient's most recent bone mineral density on June 01, 2016 revealed a T score of -2.7. Continue Fosamax, calcium, and vitamin D supplementation. Monitor closely once patient starts an aromatase inhibitor. Repeat in January 2019. 7. Port: Patient will maintain her port at this time, but states she will likely have a removed in June which will be one year removed from completing her treatments.   Patient expressed understanding and was in agreement with this plan. She also understands that She can call clinic at any time with any questions, concerns, or complaints.    Lloyd Huger, MD 10/18/16 9:23 AM

## 2016-10-17 ENCOUNTER — Inpatient Hospital Stay: Payer: Medicare Other

## 2016-10-17 ENCOUNTER — Inpatient Hospital Stay (HOSPITAL_BASED_OUTPATIENT_CLINIC_OR_DEPARTMENT_OTHER): Payer: Medicare Other | Admitting: Oncology

## 2016-10-17 VITALS — BP 95/66 | HR 93 | Temp 98.1°F | Resp 16 | Wt 160.5 lb

## 2016-10-17 DIAGNOSIS — M810 Age-related osteoporosis without current pathological fracture: Secondary | ICD-10-CM

## 2016-10-17 DIAGNOSIS — C50411 Malignant neoplasm of upper-outer quadrant of right female breast: Secondary | ICD-10-CM

## 2016-10-17 DIAGNOSIS — C50812 Malignant neoplasm of overlapping sites of left female breast: Secondary | ICD-10-CM

## 2016-10-17 DIAGNOSIS — G629 Polyneuropathy, unspecified: Secondary | ICD-10-CM | POA: Diagnosis not present

## 2016-10-17 DIAGNOSIS — G4730 Sleep apnea, unspecified: Secondary | ICD-10-CM | POA: Diagnosis not present

## 2016-10-17 DIAGNOSIS — K449 Diaphragmatic hernia without obstruction or gangrene: Secondary | ICD-10-CM

## 2016-10-17 DIAGNOSIS — R918 Other nonspecific abnormal finding of lung field: Secondary | ICD-10-CM

## 2016-10-17 DIAGNOSIS — Z170 Estrogen receptor positive status [ER+]: Secondary | ICD-10-CM

## 2016-10-17 DIAGNOSIS — Z85828 Personal history of other malignant neoplasm of skin: Secondary | ICD-10-CM

## 2016-10-17 DIAGNOSIS — K219 Gastro-esophageal reflux disease without esophagitis: Secondary | ICD-10-CM | POA: Diagnosis not present

## 2016-10-17 DIAGNOSIS — Z452 Encounter for adjustment and management of vascular access device: Secondary | ICD-10-CM | POA: Diagnosis not present

## 2016-10-17 DIAGNOSIS — Z923 Personal history of irradiation: Secondary | ICD-10-CM

## 2016-10-17 LAB — COMPREHENSIVE METABOLIC PANEL
ALBUMIN: 3.9 g/dL (ref 3.5–5.0)
ALK PHOS: 48 U/L (ref 38–126)
ALT: 18 U/L (ref 14–54)
AST: 24 U/L (ref 15–41)
Anion gap: 9 (ref 5–15)
BUN: 21 mg/dL — AB (ref 6–20)
CO2: 25 mmol/L (ref 22–32)
CREATININE: 0.8 mg/dL (ref 0.44–1.00)
Calcium: 9.6 mg/dL (ref 8.9–10.3)
Chloride: 104 mmol/L (ref 101–111)
GFR calc Af Amer: >60 mL/min (ref >60)
GFR calc non Af Amer: >60 mL/min (ref >60)
GLUCOSE: 114 mg/dL — AB (ref 65–99)
Potassium: 4.4 mmol/L (ref 3.5–5.1)
Sodium: 138 mmol/L (ref 135–145)
Total Bilirubin: 1.4 mg/dL — ABNORMAL HIGH (ref 0.3–1.2)
Total Protein: 7 g/dL (ref 6.5–8.1)

## 2016-10-17 LAB — CBC WITH DIFFERENTIAL/PLATELET
BASOS PCT: 1 %
Basophils Absolute: 0 10*3/uL (ref 0–0.1)
EOS ABS: 0.1 10*3/uL (ref 0–0.7)
Eosinophils Relative: 3 %
HCT: 41.8 % (ref 35.0–47.0)
HEMOGLOBIN: 14.4 g/dL (ref 12.0–16.0)
LYMPHS ABS: 1 10*3/uL (ref 1.0–3.6)
Lymphocytes Relative: 30 %
MCH: 31.3 pg (ref 26.0–34.0)
MCHC: 34.4 g/dL (ref 32.0–36.0)
MCV: 91 fL (ref 80.0–100.0)
Monocytes Absolute: 0.3 10*3/uL (ref 0.2–0.9)
Monocytes Relative: 10 %
Neutro Abs: 1.9 10*3/uL (ref 1.4–6.5)
Neutrophils Relative %: 56 %
Platelets: 215 10*3/uL (ref 150–440)
RBC: 4.6 MIL/uL (ref 3.80–5.20)
RDW: 13.6 % (ref 11.5–14.5)
WBC: 3.3 10*3/uL — AB (ref 3.6–11.0)

## 2016-10-17 NOTE — Progress Notes (Signed)
Patient here today for follow up.  Patient concerned about respiratory infection that she got back in february

## 2016-10-18 LAB — CANCER ANTIGEN 27.29: CA 27.29: 13.8 U/mL (ref 0.0–38.6)

## 2016-11-14 ENCOUNTER — Inpatient Hospital Stay: Payer: Medicare Other | Attending: Oncology

## 2016-11-14 DIAGNOSIS — Z170 Estrogen receptor positive status [ER+]: Secondary | ICD-10-CM | POA: Diagnosis not present

## 2016-11-14 DIAGNOSIS — Z452 Encounter for adjustment and management of vascular access device: Secondary | ICD-10-CM | POA: Insufficient documentation

## 2016-11-14 DIAGNOSIS — Z95828 Presence of other vascular implants and grafts: Secondary | ICD-10-CM

## 2016-11-14 DIAGNOSIS — C50411 Malignant neoplasm of upper-outer quadrant of right female breast: Secondary | ICD-10-CM | POA: Insufficient documentation

## 2016-11-14 MED ORDER — HEPARIN SOD (PORK) LOCK FLUSH 100 UNIT/ML IV SOLN
500.0000 [IU] | Freq: Once | INTRAVENOUS | Status: AC
  Administered 2016-11-14: 500 [IU] via INTRAVENOUS

## 2016-11-14 MED ORDER — SODIUM CHLORIDE 0.9% FLUSH
10.0000 mL | INTRAVENOUS | Status: DC | PRN
  Administered 2016-11-14: 10 mL via INTRAVENOUS
  Filled 2016-11-14: qty 10

## 2017-01-09 ENCOUNTER — Inpatient Hospital Stay: Payer: Medicare Other | Attending: Oncology

## 2017-01-09 ENCOUNTER — Inpatient Hospital Stay: Payer: Medicare Other

## 2017-01-09 DIAGNOSIS — C50812 Malignant neoplasm of overlapping sites of left female breast: Secondary | ICD-10-CM | POA: Diagnosis not present

## 2017-01-09 DIAGNOSIS — K449 Diaphragmatic hernia without obstruction or gangrene: Secondary | ICD-10-CM | POA: Diagnosis not present

## 2017-01-09 DIAGNOSIS — M810 Age-related osteoporosis without current pathological fracture: Secondary | ICD-10-CM | POA: Diagnosis not present

## 2017-01-09 DIAGNOSIS — K219 Gastro-esophageal reflux disease without esophagitis: Secondary | ICD-10-CM | POA: Insufficient documentation

## 2017-01-09 DIAGNOSIS — Z803 Family history of malignant neoplasm of breast: Secondary | ICD-10-CM | POA: Diagnosis not present

## 2017-01-09 DIAGNOSIS — Z800 Family history of malignant neoplasm of digestive organs: Secondary | ICD-10-CM | POA: Diagnosis not present

## 2017-01-09 DIAGNOSIS — C50411 Malignant neoplasm of upper-outer quadrant of right female breast: Secondary | ICD-10-CM | POA: Diagnosis not present

## 2017-01-09 DIAGNOSIS — Z923 Personal history of irradiation: Secondary | ICD-10-CM | POA: Insufficient documentation

## 2017-01-09 DIAGNOSIS — Z170 Estrogen receptor positive status [ER+]: Secondary | ICD-10-CM | POA: Diagnosis not present

## 2017-01-09 DIAGNOSIS — G629 Polyneuropathy, unspecified: Secondary | ICD-10-CM | POA: Insufficient documentation

## 2017-01-09 DIAGNOSIS — R918 Other nonspecific abnormal finding of lung field: Secondary | ICD-10-CM | POA: Insufficient documentation

## 2017-01-09 DIAGNOSIS — Z8041 Family history of malignant neoplasm of ovary: Secondary | ICD-10-CM | POA: Diagnosis not present

## 2017-01-09 DIAGNOSIS — Z95828 Presence of other vascular implants and grafts: Secondary | ICD-10-CM

## 2017-01-09 DIAGNOSIS — Z808 Family history of malignant neoplasm of other organs or systems: Secondary | ICD-10-CM | POA: Diagnosis not present

## 2017-01-09 DIAGNOSIS — Z79811 Long term (current) use of aromatase inhibitors: Secondary | ICD-10-CM | POA: Insufficient documentation

## 2017-01-09 DIAGNOSIS — Z85828 Personal history of other malignant neoplasm of skin: Secondary | ICD-10-CM | POA: Diagnosis not present

## 2017-01-09 DIAGNOSIS — Z79899 Other long term (current) drug therapy: Secondary | ICD-10-CM | POA: Insufficient documentation

## 2017-01-09 DIAGNOSIS — G4730 Sleep apnea, unspecified: Secondary | ICD-10-CM | POA: Diagnosis not present

## 2017-01-09 LAB — COMPREHENSIVE METABOLIC PANEL
ALT: 18 U/L (ref 14–54)
AST: 22 U/L (ref 15–41)
Albumin: 3.9 g/dL (ref 3.5–5.0)
Alkaline Phosphatase: 42 U/L (ref 38–126)
Anion gap: 7 (ref 5–15)
BUN: 20 mg/dL (ref 6–20)
CALCIUM: 9.5 mg/dL (ref 8.9–10.3)
CHLORIDE: 105 mmol/L (ref 101–111)
CO2: 24 mmol/L (ref 22–32)
Creatinine, Ser: 0.66 mg/dL (ref 0.44–1.00)
GFR calc non Af Amer: >60 mL/min (ref >60)
GLUCOSE: 101 mg/dL — AB (ref 65–99)
POTASSIUM: 3.9 mmol/L (ref 3.5–5.1)
SODIUM: 136 mmol/L (ref 135–145)
TOTAL PROTEIN: 6.9 g/dL (ref 6.5–8.1)
Total Bilirubin: 1.6 mg/dL — ABNORMAL HIGH (ref 0.3–1.2)

## 2017-01-09 LAB — CBC WITH DIFFERENTIAL/PLATELET
BASOS ABS: 0 10*3/uL (ref 0–0.1)
Basophils Relative: 1 %
EOS ABS: 0.1 10*3/uL (ref 0–0.7)
EOS PCT: 2 %
HCT: 42.5 % (ref 35.0–47.0)
Hemoglobin: 14.6 g/dL (ref 12.0–16.0)
LYMPHS ABS: 1.5 10*3/uL (ref 1.0–3.6)
LYMPHS PCT: 33 %
MCH: 31.5 pg (ref 26.0–34.0)
MCHC: 34.3 g/dL (ref 32.0–36.0)
MCV: 91.8 fL (ref 80.0–100.0)
MONO ABS: 0.4 10*3/uL (ref 0.2–0.9)
Monocytes Relative: 9 %
NEUTROS PCT: 55 %
Neutro Abs: 2.6 10*3/uL (ref 1.4–6.5)
PLATELETS: 176 10*3/uL (ref 150–440)
RBC: 4.63 MIL/uL (ref 3.80–5.20)
RDW: 13.5 % (ref 11.5–14.5)
WBC: 4.7 10*3/uL (ref 3.6–11.0)

## 2017-01-09 MED ORDER — SODIUM CHLORIDE 0.9% FLUSH
10.0000 mL | INTRAVENOUS | Status: DC | PRN
  Administered 2017-01-09: 10 mL via INTRAVENOUS
  Filled 2017-01-09: qty 10

## 2017-01-09 MED ORDER — HEPARIN SOD (PORK) LOCK FLUSH 100 UNIT/ML IV SOLN
500.0000 [IU] | Freq: Once | INTRAVENOUS | Status: AC
  Administered 2017-01-09: 500 [IU] via INTRAVENOUS

## 2017-01-10 LAB — CANCER ANTIGEN 27.29: CA 27.29: 20 U/mL (ref 0.0–38.6)

## 2017-01-14 NOTE — Progress Notes (Signed)
_ [place]  Telephone:([phone] [phone]  ID: Stephanie Crosby OB: 1935-06-11  MR#: [id number]  HLK#:[id number]  [name], MD as PCP - General (Family Medicine) [name], MD as Consulting Physician (General Surgery)  CHIEF COMPLAINT:   1. Clinical stage IIb ER positive, PR negative, HER-2 overexpressing invasive lobular carcinoma overlapping sites of the left breast, now pathologic stage Ia (T1c,N0,M0). 2. Clinical stage Ia ER/PR positive, HER-2 negative invasive ductal carcinoma of the upper outer quadrant of right breast, now pathologic stage 0.  INTERVAL HISTORY: Patient returns to clinic today for routine 3 month evaluation. She is tolerating letrozole and Fosamax well without significant side effects. She currently feels well and is asymptomatic. Her peripheral neuropathy is chronic and unchanged and does not affect her day-to-day activity. She has no other neurologic complaints. She has stable mild edema in both lower legs.  She denies any fevers. She has no chest pain or shortness of breath.  She denies nausea, vomiting, diarrhea, or constipation. She has no urinary complaints. Patient offers no further specific complaints today.  REVIEW OF SYSTEMS:   Review of Systems  Constitutional: Negative for fever, malaise/fatigue and weight loss.  HENT: Negative for sore throat.   Eyes: Negative for discharge and redness.  Respiratory: Negative.  Negative for shortness of breath.   Cardiovascular: Positive for leg swelling. Negative for chest pain.  Gastrointestinal: Negative for abdominal pain, diarrhea, nausea and vomiting.  Musculoskeletal: Negative.   Skin: Negative.   Neurological: Positive for tingling and sensory change (Mild neuropathy ). Negative for weakness and headaches.  Psychiatric/Behavioral: The patient is not nervous/anxious and does not have insomnia.    As per HPI. Otherwise, a complete review of  systems is negative.   PAST MEDICAL HISTORY: Past Medical History:  Diagnosis Date  . Anemia   . Back injury   . Bilateral breast cancer ([place]) 05/2015   Chemo tx's.  . Breast cancer ([place]) 2017  . Breast cancer in situ 05/2015   Left  . Edema of leg   . GERD (gastroesophageal reflux disease)   . Gonalgia   . Hiatal hernia   . Seasonal allergic rhinitis   . Sleep apnea   . Squamous cell skin cancer 2011   resected from left side of nose and Right axilla area.   . Tearing eyes    SINCE TAKING CHEMO    PAST SURGICAL HISTORY: Past Surgical History:  Procedure Laterality Date  . APPENDECTOMY    . AXILLARY SENTINEL NODE BIOPSY Bilateral 12/10/2015   Procedure: BILATERAL SENTINEL NODE BIOPSY, SENTINNEL NODE INJ.;  Surgeon: [name], MD;  Location: [facility] ORS;  Service: General;  Laterality: Bilateral;  . BREAST BIOPSY Bilateral 11/17/2015   Procedure: BREAST BIOPSY WITH NEEDLE LOCALIZATION;  Surgeon: [name], MD;  Location: [facility] ORS;  Service: General;  Laterality: Bilateral;  . BREAST LUMPECTOMY Bilateral 05/2015   01/17  . CATARACT EXTRACTION, BILATERAL Bilateral 2012  . COLONOSCOPY    . PORTACATH PLACEMENT Right 06/10/2015   Procedure: INSERTION PORT-A-CATH;  Surgeon: [name], MD;  Location: [facility] ORS;  Service: General;  Laterality: Right;    FAMILY HISTORY: Patient reports a maternal aunt with breast cancer as well as a female cousin with breast cancer.  Family History  Problem Relation Age of Onset  . Dementia Mother   . Hyperlipidemia Mother   . Hypertension Mother   . Congestive Heart Failure Mother   . Heart disease Mother   .  Aneurysm Father        abdominal  . Fibromyalgia Sister   . Heart disease Sister   . Cancer Maternal Uncle        brain  . Heart disease Maternal Uncle   . Pancreatic cancer Maternal Grandmother   . Cancer Cousin        female cousin with breast cancer and brain cancer  . Ovarian cancer Maternal Aunt   . Colon cancer  Maternal Aunt   . Breast cancer Neg Hx        ADVANCED DIRECTIVES:    HEALTH MAINTENANCE: Social History  Substance Use Topics  . Smoking status: Never Smoker  . Smokeless tobacco: Never Used  . Alcohol use 0.0 oz/week     Comment: 3 glasses wine/week      Allergies  Allergen Reactions  . Penicillins Rash    Has patient had a PCN reaction causing immediate rash, facial/tongue/throat swelling, SOB or lightheadedness with hypotension: unsure Has patient had a PCN reaction causing severe rash involving mucus membranes or skin necrosis: no Has patient had a PCN reaction that required hospitalization no Has patient had a PCN reaction occurring within the last 10 years: no If all of the above answers are "NO", then may proceed with Cephalosporin use.    Current Outpatient Prescriptions  Medication Sig Dispense Refill  . acetaminophen (TYLENOL) 500 MG tablet Take 500 mg by mouth every 6 (six) hours as needed for mild pain.    Marland Kitchen albuterol (PROVENTIL HFA;VENTOLIN HFA) 108 (90 Base) MCG/ACT inhaler Inhale into the lungs.    Marland Kitchen alendronate (FOSAMAX) 70 MG tablet Take 1 tablet (70 mg total) by mouth once a week. Take with a full glass of water on an empty stomach. 4 tablet 11  . fluticasone (FLONASE) 50 MCG/ACT nasal spray Place into both nostrils daily.    Marland Kitchen letrozole (FEMARA) 2.5 MG tablet Take 1 tablet (2.5 mg total) by mouth daily. 30 tablet 6  . lidocaine-prilocaine (EMLA) cream Apply 1 application topically as needed (for chemo therapy port access).     . Multiple Vitamins-Minerals (CENTRUM SILVER PO) Take 1 tablet by mouth daily. Reported on 06/15/2015    . omeprazole (PRILOSEC) 20 MG capsule Take 1 capsule by mouth every morning. Reported on 06/15/2015    . valACYclovir (VALTREX) 500 MG tablet Take 500 mg by mouth daily as needed (break outs). Reported on 06/15/2015     No current facility-administered medications for this visit.     OBJECTIVE: Vitals:   01/16/17 1343  BP:  130/80  Pulse: 77  Resp: 18  Temp: 97.9 F (36.6 C)     Body mass index is 26.2 kg/m.    ECOG FS:0 - Asymptomatic  General: Well-developed, well-nourished, no acute distress. Eyes: Pink conjunctiva, anicteric sclera. Breasts: Patient requested exam be deferred today. Lungs: Clear to auscultation bilaterally. Heart: Regular rate and rhythm. No rubs, murmurs, or gallops. Abdomen: Soft, nontender, nondistended. No organomegaly noted, normoactive bowel sounds. Musculoskeletal: Non-pitting edema in bilateral lower extremities. No cyanosis, or clubbing. Neuro: Alert, answering all questions appropriately. Cranial nerves grossly intact. Skin: ecchymosis right breast. Psych: Normal affect.   LAB RESULTS:  Lab Results  Component Value Date   NA 136 01/09/2017   K 3.9 01/09/2017   CL 105 01/09/2017   CO2 24 01/09/2017   GLUCOSE 101 (H) 01/09/2017   BUN 20 01/09/2017   CREATININE 0.66 01/09/2017   CALCIUM 9.5 01/09/2017   PROT 6.9 01/09/2017   ALBUMIN 3.9  01/09/2017   AST 22 01/09/2017   ALT 18 01/09/2017   ALKPHOS 42 01/09/2017   BILITOT 1.6 (H) 01/09/2017   GFRNONAA >60 01/09/2017   GFRAA >60 01/09/2017    Lab Results  Component Value Date   WBC 4.7 01/09/2017   NEUTROABS 2.6 01/09/2017   HGB 14.6 01/09/2017   HCT 42.5 01/09/2017   MCV 91.8 01/09/2017   PLT 176 01/09/2017   Lab Results  Component Value Date   IRON 122 03/14/2016   TIBC 298 03/14/2016   IRONPCTSAT 41 (H) 03/14/2016   Lab Results  Component Value Date   FERRITIN 61 03/14/2016     STUDIES: No results found.  ASSESSMENT:   1. Clinical stage IIb ER positive, PR negative, HER-2 overexpressing invasive lobular carcinoma overlapping sites of the left breast, now pathologic stage Ia (T1c,N0,M0). 2. Clinical stage Ia ER/PR positive, HER-2 negative invasive ductal carcinoma of the upper outer quadrant of right breast, now pathologic stage 0. 3. BRCA 1 and 2 negative.  PLAN:    1. Clinical stage  IIb ER positive, PR negative, HER-2 overexpressing invasive lobular carcinoma overlapping sites of the left breast, now pathologic stage Ia (T1c,N0,M0): Patient noted to have residual stage IA tumor and her left breast with a complete pathologic response in her right breast. Patient completed 6 cycles of chemotherapy in June 2017 and year-long Herceptin maintenance on July 11, 2016. Patient initiated letrozole in March 2018 which she will take for 5 years completing in March 2023. Patient's most recent mammogram on July 07, 2016 was reported as BI-RADS 2. Return to clinic in 6 months for routine evaluation. 2. Clinical stage Ia ER/PR positive, HER-2 negative invasive ductal carcinoma of the upper outer quadrant of right breast, now pathologic stage 0: Complete pathologic response. Patient has completed XRT. Letrozole as above. 3. Pulmonary nodule: CT from December 09, 2015 results reviewed independently with pulmonary lesion that is unchanged. No intervention is needed. Repeat CT scan in the next 1-2 weeks.   4. Anemia: Patient's hemoglobin is now within normal limits. She received IV iron in June 2017.  5. Acid Reflux: Continue omeprazole. 6. Osteoporosis: Patient's most recent bone mineral density on June 01, 2016 revealed a T score of -2.7. Continue Fosamax, calcium, and vitamin D supplementation. Monitor closely once patient starts an aromatase inhibitor. Repeat in January 2019. 7. Port: Patient will maintain her port at this time, but states she will likely have a removed in June which will be one year removed from completing her treatments.   Patient expressed understanding and was in agreement with this plan. She also understands that She can call clinic at any time with any questions, concerns, or complaints.    Lloyd Huger, MD 01/16/17 3:43 PM

## 2017-01-16 ENCOUNTER — Inpatient Hospital Stay (HOSPITAL_BASED_OUTPATIENT_CLINIC_OR_DEPARTMENT_OTHER): Payer: Medicare Other | Admitting: Oncology

## 2017-01-16 ENCOUNTER — Other Ambulatory Visit: Payer: Medicare Other

## 2017-01-16 VITALS — BP 130/80 | HR 77 | Temp 97.9°F | Resp 18 | Wt 162.3 lb

## 2017-01-16 DIAGNOSIS — Z79811 Long term (current) use of aromatase inhibitors: Secondary | ICD-10-CM

## 2017-01-16 DIAGNOSIS — Z808 Family history of malignant neoplasm of other organs or systems: Secondary | ICD-10-CM

## 2017-01-16 DIAGNOSIS — R918 Other nonspecific abnormal finding of lung field: Secondary | ICD-10-CM | POA: Diagnosis not present

## 2017-01-16 DIAGNOSIS — Z79899 Other long term (current) drug therapy: Secondary | ICD-10-CM | POA: Diagnosis not present

## 2017-01-16 DIAGNOSIS — G629 Polyneuropathy, unspecified: Secondary | ICD-10-CM | POA: Diagnosis not present

## 2017-01-16 DIAGNOSIS — C50411 Malignant neoplasm of upper-outer quadrant of right female breast: Secondary | ICD-10-CM | POA: Diagnosis not present

## 2017-01-16 DIAGNOSIS — Z803 Family history of malignant neoplasm of breast: Secondary | ICD-10-CM

## 2017-01-16 DIAGNOSIS — Z170 Estrogen receptor positive status [ER+]: Secondary | ICD-10-CM | POA: Diagnosis not present

## 2017-01-16 DIAGNOSIS — Z923 Personal history of irradiation: Secondary | ICD-10-CM

## 2017-01-16 DIAGNOSIS — K219 Gastro-esophageal reflux disease without esophagitis: Secondary | ICD-10-CM

## 2017-01-16 DIAGNOSIS — G4730 Sleep apnea, unspecified: Secondary | ICD-10-CM | POA: Diagnosis not present

## 2017-01-16 DIAGNOSIS — K449 Diaphragmatic hernia without obstruction or gangrene: Secondary | ICD-10-CM | POA: Diagnosis not present

## 2017-01-16 DIAGNOSIS — C50812 Malignant neoplasm of overlapping sites of left female breast: Secondary | ICD-10-CM | POA: Diagnosis not present

## 2017-01-16 DIAGNOSIS — M810 Age-related osteoporosis without current pathological fracture: Secondary | ICD-10-CM

## 2017-01-16 DIAGNOSIS — Z8041 Family history of malignant neoplasm of ovary: Secondary | ICD-10-CM

## 2017-01-16 DIAGNOSIS — Z85828 Personal history of other malignant neoplasm of skin: Secondary | ICD-10-CM

## 2017-01-16 DIAGNOSIS — Z800 Family history of malignant neoplasm of digestive organs: Secondary | ICD-10-CM

## 2017-01-26 ENCOUNTER — Ambulatory Visit: Payer: Medicare Other

## 2017-02-03 ENCOUNTER — Ambulatory Visit
Admission: RE | Admit: 2017-02-03 | Discharge: 2017-02-03 | Disposition: A | Discharge status: Home / Self Care | Payer: Medicare Other | Source: Ambulatory Visit | Attending: Oncology | Admitting: Oncology

## 2017-02-03 DIAGNOSIS — C50812 Malignant neoplasm of overlapping sites of left female breast: Secondary | ICD-10-CM | POA: Diagnosis not present

## 2017-02-03 DIAGNOSIS — I700 Atherosclerosis of aorta: Secondary | ICD-10-CM | POA: Insufficient documentation

## 2017-02-03 DIAGNOSIS — Z170 Estrogen receptor positive status [ER+]: Secondary | ICD-10-CM | POA: Diagnosis not present

## 2017-02-03 DIAGNOSIS — C50411 Malignant neoplasm of upper-outer quadrant of right female breast: Secondary | ICD-10-CM | POA: Insufficient documentation

## 2017-02-03 DIAGNOSIS — R918 Other nonspecific abnormal finding of lung field: Secondary | ICD-10-CM | POA: Insufficient documentation

## 2017-02-03 MED ORDER — IOPAMIDOL (ISOVUE-300) INJECTION 61%
75.0000 mL | Freq: Once | INTRAVENOUS | Status: AC | PRN
  Administered 2017-02-03: 75 mL via INTRAVENOUS

## 2017-02-17 IMAGING — CT CT CHEST W/ CM
1 series · 14 of 34 positions shown, 18 images · IV contrast (iopamidol)
Comparison: Chest radiograph 11/10/2015.  Chest CT 05/20/2015.

CLINICAL DATA: History of bilateral breast cancer with chemotherapy
an last 3 months. Axillary node surgery tomorrow. Port-A-Cath.
Followup of pulmonary nodule.

EXAM:
CT CHEST WITH CONTRAST
TECHNIQUE: Multidetector CT imaging of the chest was performed during
intravenous contrast administration.
CONTRAST:  75mL XKO1VN-FEE IOPAMIDOL (XKO1VN-FEE) INJECTION 61%

[Series 2: axial st · axial · 0.62mm/px · z∈[-671,-423]mm · 14 of 146 slices shown, 18 images]
[im 11/146  mediastinal]
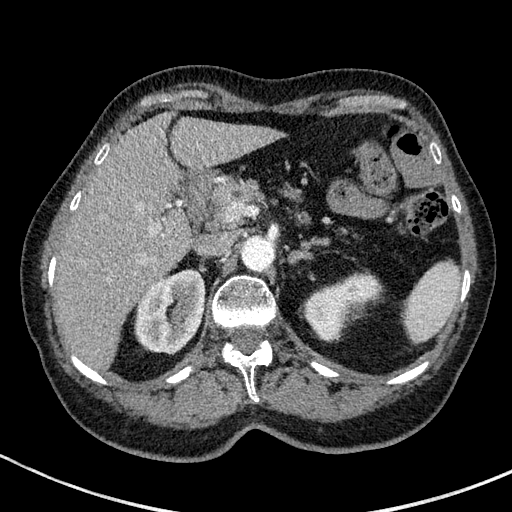
[im 11/146  lung]
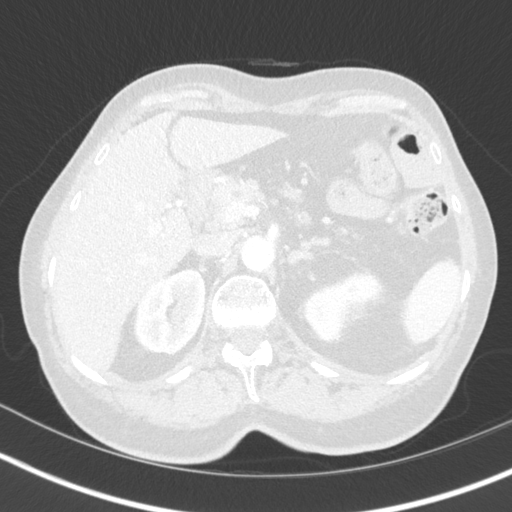
[im 22/146  lung]
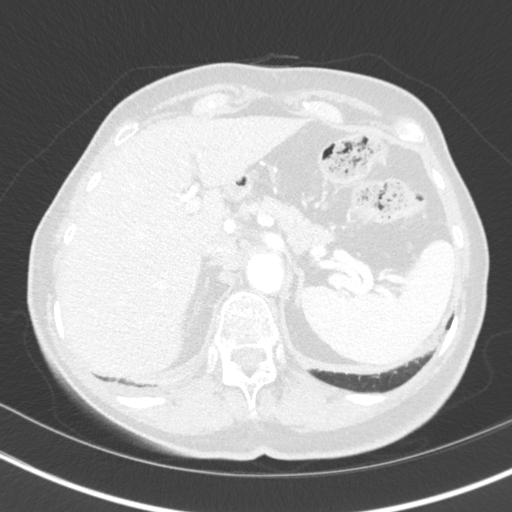
[im 30/146  lung]
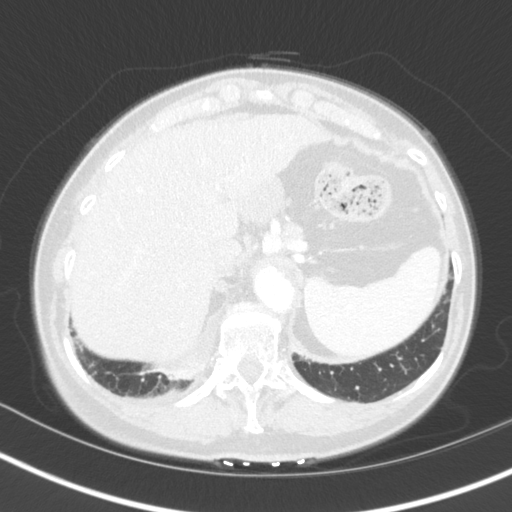
[im 43/146  lung]
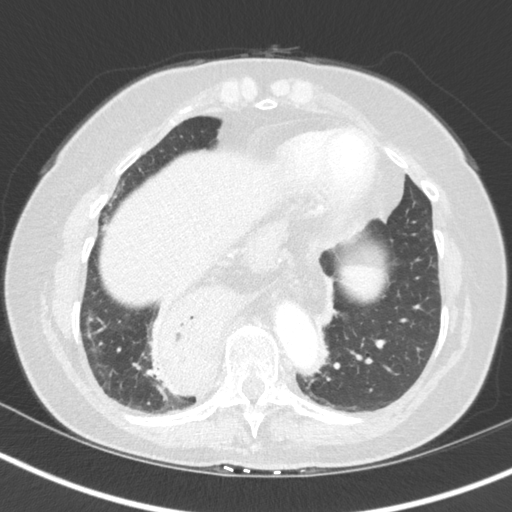
[im 54/146  mediastinal]
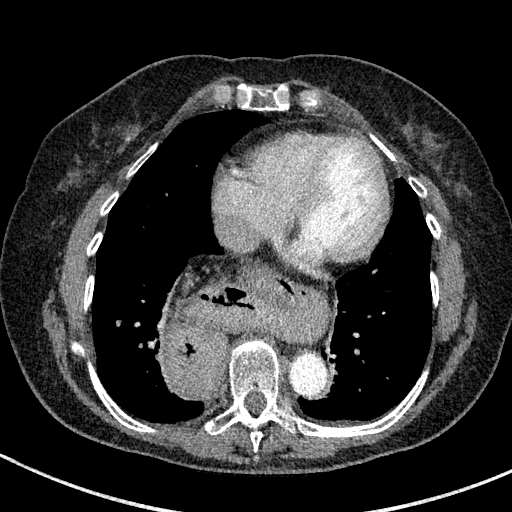
[im 54/146  lung]
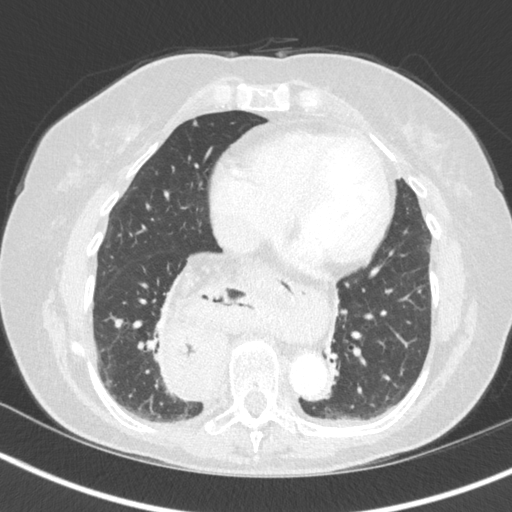
[im 60/146  lung]
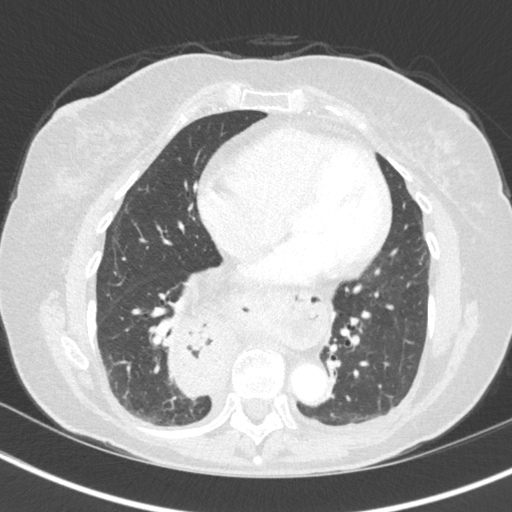
[im 69/146  lung]
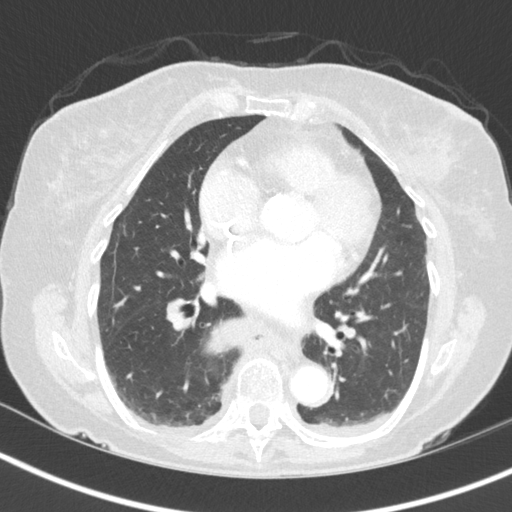
[im 78/146  lung]
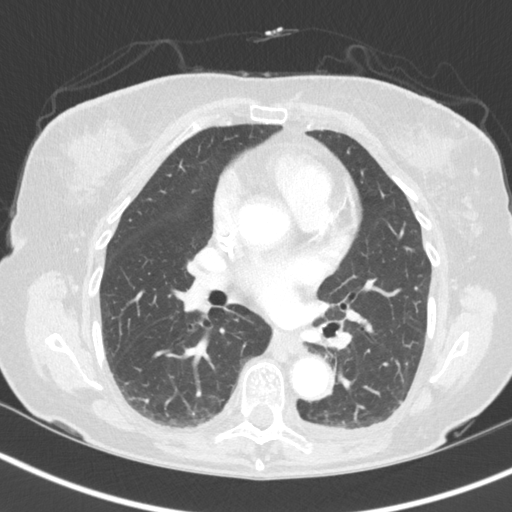
[im 86/146  mediastinal]
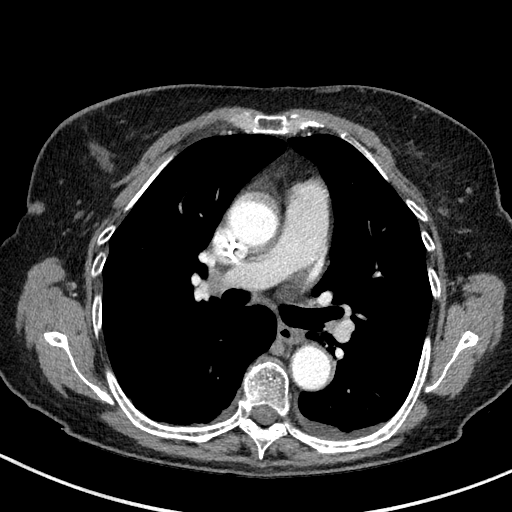
[im 86/146  lung]
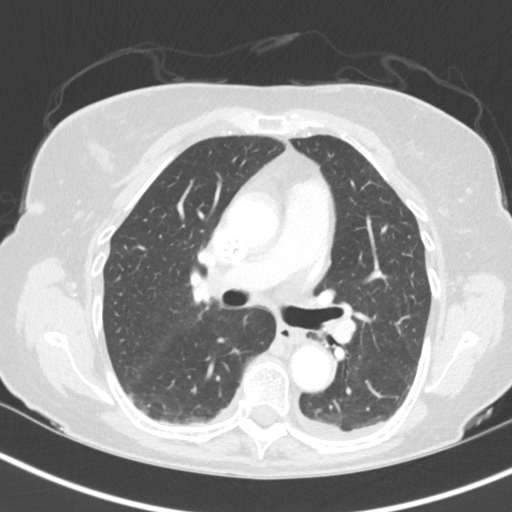
[im 92/146  lung]
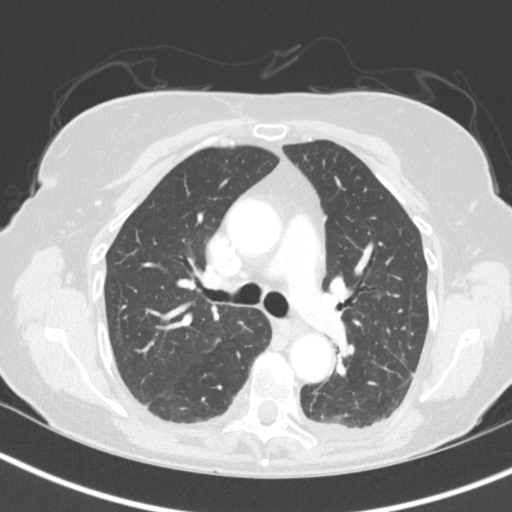
[im 108/146  lung]
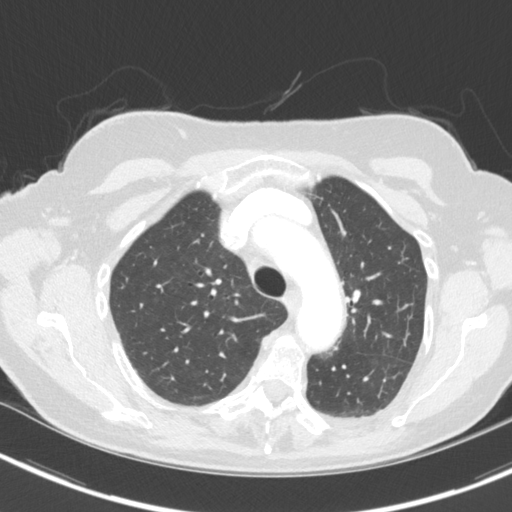
[im 117/146  lung]
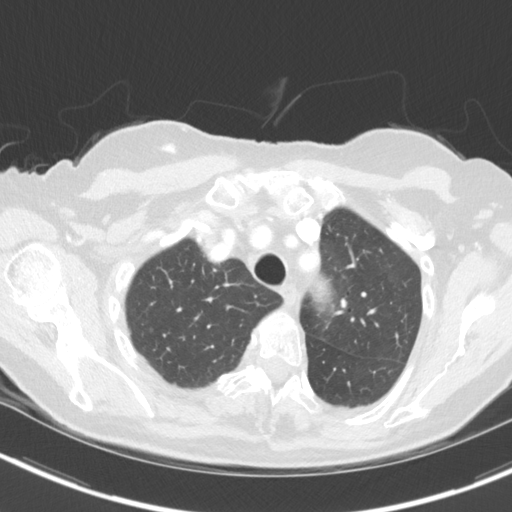
[im 124/146  mediastinal]
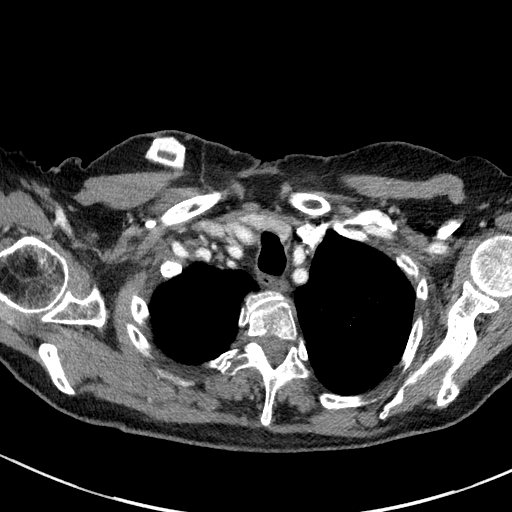
[im 124/146  lung]
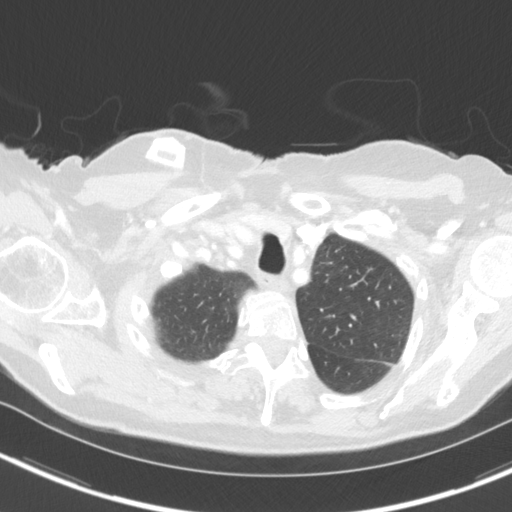
[im 135/146  lung]
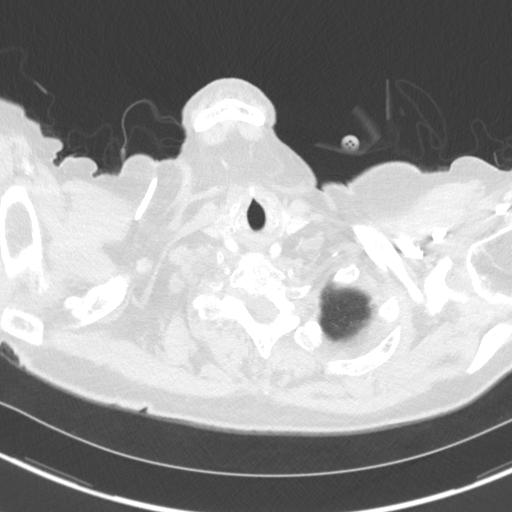

[14 of 34 positions shown; findings below may reference images not displayed]

FINDINGS: Cardiovascular: Aortic and branch vessel atherosclerosis. Tortuous
thoracic aorta. Mild cardiomegaly. Multivessel coronary artery
atherosclerosis. No pericardial effusion. No central pulmonary
embolism, on this non-dedicated study.

Mediastinum/Nodes: No supraclavicular adenopathy. Hypo attenuating
bilateral thyroid nodules by nonspecific. Soft tissue fullness in
both breasts is likely related to recent biopsy, new. A right
Port-A-Cath terminates at the high right atrium. No mediastinal or
hilar adenopathy. Large hiatal hernia, with approximately [DATE] of the
stomach positioned within the chest.

Lungs/Pleura: Trace bilateral pleural effusions. New on the right
and increased on the left. The right lower lobe pulmonary nodule
which measures 6 x 4 mm on image 98/ series 3. Compare 5 x 4 mm on
the prior. Appears more well-defined today, possibly due to is
thinner slice collimation. 2 mm left lower lobe pulmonary nodules on
images 84 and 81/series 3 are not readily apparent on the prior,
likely due to small size.

Anterior left upper lobe subpleural 3 mm nodule on image 44/ series
3 is likely present on the prior but obscured by minimal motion in
this area.

3 mm left lower lobe pulmonary nodule on image 74/ series 3 is
likely similar.

Upper Abdomen: Normal imaged portions of the liver, spleen,
pancreas, adrenal glands, left kidney. Mild right upper pole renal
scarring.

Musculoskeletal: Mild compression deformity involving T4 is is
similar. S-shaped thoracolumbar spine curvature.
IMPRESSION: 1. The right lower lobe pulmonary nodule is similar to minimally
enlarged. More well-defined today, secondary to thinner slice
combination. Other pulmonary nodules are primarily similar. There
are tiny nodules which are not readily apparent on the prior,
possibly related to technique and small size.
2.  Coronary artery atherosclerosis. Aortic atherosclerosis.
3. Otherwise, no evidence of metastatic disease.
4. Large hiatal hernia.

## 2017-03-03 ENCOUNTER — Inpatient Hospital Stay: Payer: Medicare Other | Attending: Oncology

## 2017-03-03 DIAGNOSIS — Z170 Estrogen receptor positive status [ER+]: Secondary | ICD-10-CM | POA: Diagnosis not present

## 2017-03-03 DIAGNOSIS — C50411 Malignant neoplasm of upper-outer quadrant of right female breast: Secondary | ICD-10-CM | POA: Diagnosis present

## 2017-03-03 DIAGNOSIS — Z452 Encounter for adjustment and management of vascular access device: Secondary | ICD-10-CM | POA: Insufficient documentation

## 2017-03-03 DIAGNOSIS — Z95828 Presence of other vascular implants and grafts: Secondary | ICD-10-CM

## 2017-03-03 MED ORDER — HEPARIN SOD (PORK) LOCK FLUSH 100 UNIT/ML IV SOLN
500.0000 [IU] | INTRAVENOUS | Status: AC | PRN
  Administered 2017-03-03: 500 [IU]

## 2017-03-03 MED ORDER — SODIUM CHLORIDE 0.9% FLUSH
10.0000 mL | INTRAVENOUS | Status: AC | PRN
  Administered 2017-03-03: 10 mL
  Filled 2017-03-03: qty 10

## 2017-03-29 ENCOUNTER — Ambulatory Visit
Admission: RE | Admit: 2017-03-29 | Discharge: 2017-03-29 | Disposition: A | Discharge status: Home / Self Care | Payer: Medicare Other | Source: Ambulatory Visit | Attending: Radiation Oncology | Admitting: Radiation Oncology

## 2017-03-29 ENCOUNTER — Other Ambulatory Visit: Payer: Self-pay

## 2017-03-29 ENCOUNTER — Encounter: Payer: Self-pay | Admitting: Radiation Oncology

## 2017-03-29 VITALS — BP 139/87 | HR 87 | Temp 98.1°F | Resp 18 | Wt 168.3 lb

## 2017-03-29 DIAGNOSIS — C50411 Malignant neoplasm of upper-outer quadrant of right female breast: Secondary | ICD-10-CM

## 2017-03-29 DIAGNOSIS — C50812 Malignant neoplasm of overlapping sites of left female breast: Secondary | ICD-10-CM | POA: Insufficient documentation

## 2017-03-29 DIAGNOSIS — Z170 Estrogen receptor positive status [ER+]: Secondary | ICD-10-CM | POA: Insufficient documentation

## 2017-03-29 DIAGNOSIS — Z923 Personal history of irradiation: Secondary | ICD-10-CM | POA: Insufficient documentation

## 2017-03-29 DIAGNOSIS — D0511 Intraductal carcinoma in situ of right breast: Secondary | ICD-10-CM | POA: Diagnosis not present

## 2017-03-29 NOTE — Progress Notes (Signed)
Radiation Oncology Follow up Note  Name: Stephanie Crosby   Date:   03/29/2017 MRN:  [id number] DOB: Mar 28, 1936    This 81 y.o. female presents to the clinic today for 1 year follow-up status post bilateral whole breast radiation.  REFERRING PROVIDER: Maryland Pink, MD  HPI: Patient is a 81 year old female now 1 year out having completed whole breast radiation to her bilateral breasts. Her left breast had invasive lobular carcinoma stage Ia ER positive PR negative in her right breast for ductal carcinoma in situ ER/PR positive. She is seen today in routine follow-up and is doing well. She states she occasionally has some pulling pain in her right axilla which I have explained to her is scar tissue nothing significant. She otherwise specifically denies breast tenderness cough or bone pain. She is currently on Femara although not certain of how often she is taking that.. Her last mammogram which I have reviewed was a BI-RADS 2 benign back in March 2018.  COMPLICATIONS OF TREATMENT: none  FOLLOW UP COMPLIANCE: keeps appointments   PHYSICAL EXAM:  BP 139/87   Pulse 87   Temp 98.1 F (36.7 C)   Resp 18   Wt 168 lb 5.1 oz (76.4 kg)   BMI 27.17 kg/m  Lungs are clear to A&P cardiac examination essentially unremarkable with regular rate and rhythm. No dominant mass or nodularity is noted in either breast in 2 positions examined. Incision is well-healed. No axillary or supraclavicular adenopathy is appreciated. Cosmetic result is excellent. Well-developed well-nourished patient in NAD. HEENT reveals PERLA, EOMI, discs not visualized.  Oral cavity is clear. No oral mucosal lesions are identified. Neck is clear without evidence of cervical or supraclavicular adenopathy. Lungs are clear to A&P. Cardiac examination is essentially unremarkable with regular rate and rhythm without murmur rub or thrill. Abdomen is benign with no organomegaly or masses noted. Motor sensory and DTR levels are equal and  symmetric in the upper and lower extremities. Cranial nerves II through XII are grossly intact. Proprioception is intact. No peripheral adenopathy or edema is identified. No motor or sensory levels are noted. Crude visual fields are within normal range.  RADIOLOGY RESULTS: Bilateral mammograms are reviewed and compatible with the above-stated findings  PLAN: At the present time patient is doing well with no evidence of disease. She continues on Femara without side effect. She is or is scheduled for follow-up mammograms in March. I've asked to see her back in 1 year for follow-up. Patient knows to call with any concerns.  I would like to take this opportunity to thank you for allowing me to participate in the care of your patient.Armstead Peaks., MD

## 2017-04-28 ENCOUNTER — Inpatient Hospital Stay: Payer: Medicare Other | Attending: Oncology

## 2017-04-28 DIAGNOSIS — C50812 Malignant neoplasm of overlapping sites of left female breast: Secondary | ICD-10-CM | POA: Insufficient documentation

## 2017-04-28 DIAGNOSIS — Z170 Estrogen receptor positive status [ER+]: Secondary | ICD-10-CM | POA: Insufficient documentation

## 2017-04-28 DIAGNOSIS — Z95828 Presence of other vascular implants and grafts: Secondary | ICD-10-CM

## 2017-04-28 DIAGNOSIS — Z452 Encounter for adjustment and management of vascular access device: Secondary | ICD-10-CM | POA: Insufficient documentation

## 2017-04-28 MED ORDER — HEPARIN SOD (PORK) LOCK FLUSH 100 UNIT/ML IV SOLN
500.0000 [IU] | INTRAVENOUS | Status: AC | PRN
  Administered 2017-04-28: 500 [IU]

## 2017-04-28 MED ORDER — SODIUM CHLORIDE 0.9% FLUSH
10.0000 mL | INTRAVENOUS | Status: AC | PRN
Start: 2017-04-28 — End: 2017-04-28
  Administered 2017-04-28: 10 mL
  Filled 2017-04-28: qty 10

## 2017-05-11 ENCOUNTER — Other Ambulatory Visit: Payer: Self-pay | Admitting: Oncology

## 2017-06-05 ENCOUNTER — Other Ambulatory Visit: Payer: Medicare Other

## 2017-06-13 ENCOUNTER — Inpatient Hospital Stay: Payer: Medicare Other | Attending: Oncology

## 2017-06-13 DIAGNOSIS — Z95828 Presence of other vascular implants and grafts: Secondary | ICD-10-CM

## 2017-06-13 DIAGNOSIS — C50411 Malignant neoplasm of upper-outer quadrant of right female breast: Secondary | ICD-10-CM | POA: Insufficient documentation

## 2017-06-13 MED ORDER — HEPARIN SOD (PORK) LOCK FLUSH 100 UNIT/ML IV SOLN
500.0000 [IU] | INTRAVENOUS | Status: AC | PRN
  Administered 2017-06-13: 500 [IU]

## 2017-06-13 MED ORDER — SODIUM CHLORIDE 0.9% FLUSH
10.0000 mL | INTRAVENOUS | Status: AC | PRN
  Administered 2017-06-13: 10 mL
  Filled 2017-06-13: qty 10

## 2017-06-29 ENCOUNTER — Other Ambulatory Visit: Payer: Self-pay | Admitting: Oncology

## 2017-06-29 DIAGNOSIS — Z170 Estrogen receptor positive status [ER+]: Principal | ICD-10-CM

## 2017-06-29 DIAGNOSIS — C50411 Malignant neoplasm of upper-outer quadrant of right female breast: Secondary | ICD-10-CM

## 2017-07-11 ENCOUNTER — Ambulatory Visit
Admission: RE | Admit: 2017-07-11 | Discharge: 2017-07-11 | Disposition: A | Discharge status: Home / Self Care | Payer: Medicare Other | Source: Ambulatory Visit | Attending: Oncology | Admitting: Oncology

## 2017-07-11 DIAGNOSIS — Z853 Personal history of malignant neoplasm of breast: Secondary | ICD-10-CM | POA: Diagnosis present

## 2017-07-11 DIAGNOSIS — C50812 Malignant neoplasm of overlapping sites of left female breast: Secondary | ICD-10-CM

## 2017-07-11 DIAGNOSIS — Z170 Estrogen receptor positive status [ER+]: Secondary | ICD-10-CM

## 2017-07-11 DIAGNOSIS — C50411 Malignant neoplasm of upper-outer quadrant of right female breast: Secondary | ICD-10-CM

## 2017-07-13 ENCOUNTER — Ambulatory Visit
Admission: RE | Admit: 2017-07-13 | Discharge: 2017-07-13 | Disposition: A | Discharge status: Home / Self Care | Payer: Medicare Other | Source: Ambulatory Visit | Attending: Oncology | Admitting: Oncology

## 2017-07-13 DIAGNOSIS — Z79899 Other long term (current) drug therapy: Secondary | ICD-10-CM | POA: Insufficient documentation

## 2017-07-13 DIAGNOSIS — C50411 Malignant neoplasm of upper-outer quadrant of right female breast: Secondary | ICD-10-CM | POA: Diagnosis present

## 2017-07-13 DIAGNOSIS — M8588 Other specified disorders of bone density and structure, other site: Secondary | ICD-10-CM | POA: Diagnosis not present

## 2017-07-13 DIAGNOSIS — Z170 Estrogen receptor positive status [ER+]: Secondary | ICD-10-CM | POA: Insufficient documentation

## 2017-07-13 DIAGNOSIS — C50812 Malignant neoplasm of overlapping sites of left female breast: Secondary | ICD-10-CM

## 2017-07-16 NOTE — Progress Notes (Signed)
_ [place]  Telephone:([phone] [phone]  ID: Stephanie Crosby OB: 07/07/1935  MR#: [id number]  WEX#:[id number]  [name], MD as PCP - General (Family Medicine) [name], MD as Consulting Physician (General Surgery)  CHIEF COMPLAINT:   1. Clinical stage IIb ER positive, PR negative, HER-2 overexpressing invasive lobular carcinoma overlapping sites of the left breast, now pathologic stage Ia (T1c,N0,M0). 2. Clinical stage Ia ER/PR positive, HER-2 negative invasive ductal carcinoma of the upper outer quadrant of right breast, now pathologic stage 0.  INTERVAL HISTORY: Patient returns to clinic today for routine 6 month evaluation and discussion of her imaging results.  She continues to tolerate letrozole and Fosamax well without significant side effects, although she does admit to intermittent joint pain.  She currently feels well and is asymptomatic. Her peripheral neuropathy is chronic and unchanged and does not affect her day-to-day activity. She has no other neurologic complaints. She has stable mild edema in both lower legs.  She denies any fevers. She has no chest pain or shortness of breath.  She denies nausea, vomiting, diarrhea, or constipation. She has no urinary complaints. Patient offers no further specific complaints today.  REVIEW OF SYSTEMS:   Review of Systems  Constitutional: Negative for fever, malaise/fatigue and weight loss.  HENT: Negative for sore throat.   Eyes: Negative for discharge and redness.  Respiratory: Negative.  Negative for shortness of breath.   Cardiovascular: Positive for leg swelling. Negative for chest pain.  Gastrointestinal: Negative for abdominal pain, diarrhea, nausea and vomiting.  Musculoskeletal: Positive for joint pain.  Skin: Negative.   Neurological: Positive for tingling and sensory change (Mild neuropathy ). Negative for weakness and headaches.  Psychiatric/Behavioral: The  patient is not nervous/anxious and does not have insomnia.    As per HPI. Otherwise, a complete review of systems is negative.   PAST MEDICAL HISTORY: Past Medical History:  Diagnosis Date  . Anemia   . Back injury   . Bilateral breast cancer ([place]) 05/2015   Chemo tx's.  . Breast cancer ([place]) 2017  . Breast cancer in situ 05/2015   Left  . Edema of leg   . GERD (gastroesophageal reflux disease)   . Gonalgia   . Hiatal hernia   . Seasonal allergic rhinitis   . Sleep apnea   . Squamous cell skin cancer 2011   resected from left side of nose and Right axilla area.   . Tearing eyes    SINCE TAKING CHEMO    PAST SURGICAL HISTORY: Past Surgical History:  Procedure Laterality Date  . APPENDECTOMY    . AXILLARY SENTINEL NODE BIOPSY Bilateral 12/10/2015   Procedure: BILATERAL SENTINEL NODE BIOPSY, SENTINNEL NODE INJ.;  Surgeon: [name], MD;  Location: [facility] ORS;  Service: General;  Laterality: Bilateral;  . BREAST BIOPSY Bilateral 11/17/2015   Procedure: BREAST BIOPSY WITH NEEDLE LOCALIZATION;  Surgeon: [name], MD;  Location: [facility] ORS;  Service: General;  Laterality: Bilateral;  . BREAST LUMPECTOMY Bilateral 05/2015   01/17 chemo before radiation after  . CATARACT EXTRACTION, BILATERAL Bilateral 2012  . COLONOSCOPY    . PORTACATH PLACEMENT Right 06/10/2015   Procedure: INSERTION PORT-A-CATH;  Surgeon: [name], MD;  Location: [facility] ORS;  Service: General;  Laterality: Right;    FAMILY HISTORY: Patient reports a maternal aunt with breast cancer as well as a female cousin with breast cancer.  Family History  Problem Relation Age of Onset  . Dementia Mother   .  Hyperlipidemia Mother   . Hypertension Mother   . Congestive Heart Failure Mother   . Heart disease Mother   . Aneurysm Father        abdominal  . Fibromyalgia Sister   . Heart disease Sister   . Cancer Maternal Uncle        brain  . Heart disease Maternal Uncle   . Pancreatic cancer Maternal  Grandmother   . Cancer Cousin        female cousin with breast cancer and brain cancer  . Ovarian cancer Maternal Aunt   . Colon cancer Maternal Aunt   . Breast cancer Neg Hx        ADVANCED DIRECTIVES:    HEALTH MAINTENANCE: Social History   Tobacco Use  . Smoking status: Never Smoker  . Smokeless tobacco: Never Used  Substance Use Topics  . Alcohol use: Yes    Alcohol/week: 0.0 oz    Comment: 3 glasses wine/week  . Drug use: No      Allergies  Allergen Reactions  . Penicillins Rash    Has patient had a PCN reaction causing immediate rash, facial/tongue/throat swelling, SOB or lightheadedness with hypotension: unsure Has patient had a PCN reaction causing severe rash involving mucus membranes or skin necrosis: no Has patient had a PCN reaction that required hospitalization no Has patient had a PCN reaction occurring within the last 10 years: no If all of the above answers are "NO", then may proceed with Cephalosporin use.    Current Outpatient Medications  Medication Sig Dispense Refill  . acetaminophen (TYLENOL) 500 MG tablet Take 500 mg by mouth every 6 (six) hours as needed for mild pain.    Marland Kitchen alendronate (FOSAMAX) 70 MG tablet TAKE 1 TABLET BY MOUTH ONCE A WEEK. TAKEWITH A FULL GLASS OF WATER ONAN EMPTY STOMACH 4 tablet 11  . letrozole (FEMARA) 2.5 MG tablet TAKE ONE TABLET BY MOUTH EVERY DAY 30 tablet 6  . Multiple Vitamins-Minerals (CENTRUM SILVER PO) Take 1 tablet by mouth daily. Reported on 06/15/2015    . omeprazole (PRILOSEC) 20 MG capsule Take 1 capsule by mouth every morning. Reported on 06/15/2015    . albuterol (PROVENTIL HFA;VENTOLIN HFA) 108 (90 Base) MCG/ACT inhaler Inhale into the lungs.    . fluticasone (FLONASE) 50 MCG/ACT nasal spray Place into both nostrils daily.    Marland Kitchen guaiFENesin-codeine 100-10 MG/5ML syrup Take by mouth.    . lidocaine-prilocaine (EMLA) cream Apply 1 application topically as needed (for chemo therapy port access).     .  valACYclovir (VALTREX) 500 MG tablet Take 500 mg by mouth daily as needed (break outs). Reported on 06/15/2015     No current facility-administered medications for this visit.   1 1  OBJECTIVE: Vitals:   07/17/17 1409  BP: (!) 139/94  Pulse: 98  Resp: 18  Temp: (!) 97.3 F (36.3 C)     Body mass index is 26.89 kg/m.    ECOG FS:0 - Asymptomatic  General: Well-developed, well-nourished, no acute distress. Eyes: Pink conjunctiva, anicteric sclera. Breasts: Patient requested exam be deferred today. Lungs: Clear to auscultation bilaterally. Heart: Regular rate and rhythm. No rubs, murmurs, or gallops. Abdomen: Soft, nontender, nondistended. No organomegaly noted, normoactive bowel sounds. Musculoskeletal: Non-pitting edema in bilateral lower extremities. No cyanosis, or clubbing. Neuro: Alert, answering all questions appropriately. Cranial nerves grossly intact. Skin: ecchymosis right breast. Psych: Normal affect.   LAB RESULTS:  Lab Results  Component Value Date   NA 136 01/09/2017  K 3.9 01/09/2017   CL 105 01/09/2017   CO2 24 01/09/2017   GLUCOSE 101 (H) 01/09/2017   BUN 20 01/09/2017   CREATININE 0.66 01/09/2017   CALCIUM 9.5 01/09/2017   PROT 6.9 01/09/2017   ALBUMIN 3.9 01/09/2017   AST 22 01/09/2017   ALT 18 01/09/2017   ALKPHOS 42 01/09/2017   BILITOT 1.6 (H) 01/09/2017   GFRNONAA >60 01/09/2017   GFRAA >60 01/09/2017    Lab Results  Component Value Date   WBC 4.7 01/09/2017   NEUTROABS 2.6 01/09/2017   HGB 14.6 01/09/2017   HCT 42.5 01/09/2017   MCV 91.8 01/09/2017   PLT 176 01/09/2017   Lab Results  Component Value Date   IRON 122 03/14/2016   TIBC 298 03/14/2016   IRONPCTSAT 41 (H) 03/14/2016   Lab Results  Component Value Date   FERRITIN 61 03/14/2016     STUDIES: Dg Bone Density  Result Date: 07/13/2017 EXAM: DUAL X-RAY ABSORPTIOMETRY (DXA) FOR BONE MINERAL DENSITY IMPRESSION: Dear Dr Grayland Ormond, Your patient Stephanie Crosby completed a  FRAX assessment on 07/13/2017 using the [place] (analysis version: 14.10) manufactured by [employer]. The following summarizes the results of our evaluation. PATIENT BIOGRAPHICAL: Name: Stephanie, Crosby Patient ID: [id number] Birth Date: 05-15-1935 Height:    66.0 in. Gender:     Female    [age]       Weight:    168.0 lbs. Ethnicity:  White                            Exam Date: 07/13/2017 FRAX* RESULTS:  (version: 3.5) 10-year Probability of Fracture1 Major Osteoporotic Fracture2 Hip Fracture 23.2% 6.4% Population: [place] (Caucasian) Risk Factors: History of Fracture (Adult) Based on Femur (Left) Neck BMD 1 -The 10-year probability of fracture may be lower than reported if the patient has received treatment. 2 -Major Osteoporotic Fracture: Clinical Spine, Forearm, Hip or Shoulder *FRAX is a Materials engineer of the [employer] for Metabolic Bone Disease, a [place] (WHO) [facility]. ASSESSMENT: The probability of a major osteoporotic fracture is 23.2% within the next ten years. The probability of a hip fracture is 6.4% within the next ten years. . Dear Dr Grayland Ormond, Your patient Stephanie Crosby completed a BMD test on 07/13/2017 using the [place] (analysis version: 14.10) manufactured by [employer]. The following summarizes the results of our evaluation. PATIENT BIOGRAPHICAL: Name: Stephanie, Crosby Patient ID: [id number] Birth Date: 03/28/1936 Height: 66.0 in. Gender: Female Exam Date: 07/13/2017 Weight: 168.0 lbs. Indications: Advanced Age, Breast CA, Caucasian, Height Loss, High Risk Meds, History of Fracture (Adult), Postmenopausal, Previous Chemo and Radiation Fractures: coccyx, Right foot Treatments: CALCIUM VIT D, Femara, Fosamax, Multi-Vitamin with calcium, Omeprazole ASSESSMENT: The BMD measured at AP Spine L1-L4 is 0.919 g/cm2 with a T-score of -2.2. This patient is considered osteopenic according to [facility]) criteria. Site Region Measured Measured WHO Young Adult BMD Date       Age      Classification T-score AP Spine L1-L4 07/13/2017 80.1 Osteopenia -2.2 0.919 g/cm2 AP Spine L1-L4 06/01/2016 79.0 Osteoporosis -2.7 0.856 g/cm2 DualFemur Total Right 07/13/2017 80.1 Osteopenia -2.1 0.742 g/cm2 DualFemur Total Right 06/01/2016 79.0 Osteoporosis -2.6 0.679 g/cm2 DualFemur Total Mean 07/13/2017 80.1 Osteopenia -1.9 0.766 g/cm2 DualFemur Total Mean 06/01/2016 79.0 Osteopenia -2.3 0.721 g/cm2 World Pharmacologist (  WHO) criteria for post-menopausal, Caucasian Women: Normal:       T-score at or above -1 SD Osteopenia:   T-score between -1 and -2.5 SD Osteoporosis: T-score at or below -2.5 SD RECOMMENDATIONS: 1. All patients should optimize calcium and vitamin D intake. 2. Consider FDA-approved medical therapies in postmenopausal women and men aged 49 years and older, based on the following: a. A hip or vertebral(clinical or morphometric) fracture b. T-score < -2.5 at the femoral neck or spine after appropriate evaluation to exclude secondary causes c. Low bone mass (T-score between -1.0 and -2.5 at the femoral neck or spine) and a 10-year probability of a hip fracture > 3% or a 10-year probability of a major osteoporosis-related fracture > 20% based on the [place]-adapted WHO algorithm d. Clinician judgment and/or patient preferences may indicate treatment for people with 10-year fracture probabilities above or below these levels 3. Patients with diagnosis of osteoporosis or at high risk for fracture should have regular bone mineral density tests. For patients eligible for Medicare, routine testing is allowed once every 2 years. The testing frequency can be increased to one year for patients who have rapidly progressing disease, those who are receiving or discontinuing medical therapy to restore bone mass, or have additional risk factors. FOLLOW-UP: People with diagnosed cases of osteoporosis or at high risk  for fracture should have regular bone mineral density tests. For patients eligible for Medicare, routine testing is allowed once every 2 years. The testing frequency can be increased to one year for patients who have rapidly progressing disease, those who are receiving or discontinuing medical therapy to restore bone mass, or have additional risk factors. I have reviewed this report, and agree with the above findings. [facility] Electronically Signed   By: Marijo Conception, M.D.   On: 07/13/2017 09:55   Mm Diag Breast Tomo Bilateral  Result Date: 07/11/2017 CLINICAL DATA:  History of bilateral lumpectomies for breast carcinoma, performed in 2017, treated with neoadjuvant chemotherapy and adjuvant radiation therapy. EXAM: DIGITAL DIAGNOSTIC BILATERAL MAMMOGRAM WITH CAD AND TOMO COMPARISON:  Previous exam(s). ACR Breast Density Category c: The breast tissue is heterogeneously dense, which may obscure small masses. FINDINGS: There are focal areas of architectural distortion in both breasts reflecting the post lumpectomy scarring. There are no discrete masses or other areas of architectural distortion. There are no suspicious calcifications. Mammographic images were processed with CAD. IMPRESSION: 1. No evidence of recurrent or new breast malignancy. 2. Benign postsurgical changes in each breast. RECOMMENDATION: Diagnostic mammography in 1 year per standard post lumpectomy protocol. I have discussed the findings and recommendations with the patient. Results were also provided in writing at the conclusion of the visit. If applicable, a reminder letter will be sent to the patient regarding the next appointment. BI-RADS CATEGORY  2: Benign. Electronically Signed   By: Lajean Manes M.D.   On: 07/11/2017 11:48    ASSESSMENT:   1. Clinical stage IIb ER positive, PR negative, HER-2 overexpressing invasive lobular carcinoma overlapping sites of the left breast, now pathologic stage Ia (T1c,N0,M0). 2.  Clinical stage Ia ER/PR positive, HER-2 negative invasive ductal carcinoma of the upper outer quadrant of right breast, now pathologic stage 0. 3. BRCA 1 and 2 negative.  PLAN:    1. Clinical stage IIb ER positive, PR negative, HER-2 overexpressing invasive lobular carcinoma overlapping sites of the left breast, now pathologic stage Ia (T1c,N0,M0): Patient noted to have residual stage IA tumor and her left breast with a complete pathologic  response in her right breast. Patient completed 6 cycles of chemotherapy in June 2017 and year-long Herceptin maintenance on July 11, 2016. Patient initiated letrozole in March 2018 which she will take for 5 years completing in March 2023. Patient's most recent mammogram on July 11, 2017 was reported as BI-RADS 2.  Repeat in March 2020.  Return to clinic in 6 months for routine evaluation. 2. Clinical stage Ia ER/PR positive, HER-2 negative invasive ductal carcinoma of the upper outer quadrant of right breast, now pathologic stage 0: Complete pathologic response. Patient has completed XRT. Letrozole as above. 3. Pulmonary nodule: CT scan results from February 03, 2017 reviewed independently with pulmonary lesion that is unchanged. No intervention is needed.  No further imaging is necessary. 4. Anemia: Patient's hemoglobin is now within normal limits. She received IV iron in June 2017.  5. Acid Reflux: Continue omeprazole. 6. Osteoporosis: Patient had a recent bone mineral density on July 13, 2017 that revealed an improved T score of -2.2.  Previously her T score was -2.7 in January 2018. Continue Fosamax, calcium, and vitamin D supplementation.  Repeat in January 2020. 7. Port: Have sent a referral to surgery for port removal.  Patient expressed understanding and was in agreement with this plan. She also understands that She can call clinic at any time with any questions, concerns, or complaints.    Lloyd Huger, MD 07/17/17 4:41 PM

## 2017-07-17 ENCOUNTER — Other Ambulatory Visit: Payer: Self-pay

## 2017-07-17 ENCOUNTER — Inpatient Hospital Stay: Payer: Medicare Other

## 2017-07-17 ENCOUNTER — Inpatient Hospital Stay: Payer: Medicare Other | Attending: Oncology | Admitting: Oncology

## 2017-07-17 VITALS — BP 139/94 | HR 98 | Temp 97.3°F | Resp 18 | Wt 166.6 lb

## 2017-07-17 DIAGNOSIS — C50812 Malignant neoplasm of overlapping sites of left female breast: Secondary | ICD-10-CM

## 2017-07-17 DIAGNOSIS — R911 Solitary pulmonary nodule: Secondary | ICD-10-CM | POA: Diagnosis not present

## 2017-07-17 DIAGNOSIS — R609 Edema, unspecified: Secondary | ICD-10-CM | POA: Insufficient documentation

## 2017-07-17 DIAGNOSIS — K219 Gastro-esophageal reflux disease without esophagitis: Secondary | ICD-10-CM | POA: Diagnosis not present

## 2017-07-17 DIAGNOSIS — D649 Anemia, unspecified: Secondary | ICD-10-CM | POA: Insufficient documentation

## 2017-07-17 DIAGNOSIS — Z170 Estrogen receptor positive status [ER+]: Secondary | ICD-10-CM

## 2017-07-17 DIAGNOSIS — C50411 Malignant neoplasm of upper-outer quadrant of right female breast: Secondary | ICD-10-CM

## 2017-07-17 DIAGNOSIS — Z95828 Presence of other vascular implants and grafts: Secondary | ICD-10-CM

## 2017-07-17 DIAGNOSIS — M810 Age-related osteoporosis without current pathological fracture: Secondary | ICD-10-CM | POA: Insufficient documentation

## 2017-07-17 DIAGNOSIS — G620 Drug-induced polyneuropathy: Secondary | ICD-10-CM | POA: Insufficient documentation

## 2017-07-17 MED ORDER — SODIUM CHLORIDE 0.9% FLUSH
10.0000 mL | INTRAVENOUS | Status: AC | PRN
  Administered 2017-07-17: 10 mL
  Filled 2017-07-17: qty 10

## 2017-07-17 MED ORDER — HEPARIN SOD (PORK) LOCK FLUSH 100 UNIT/ML IV SOLN
500.0000 [IU] | INTRAVENOUS | Status: AC | PRN
  Administered 2017-07-17: 500 [IU]

## 2017-07-17 NOTE — Progress Notes (Signed)
Here for follow up. Stated feb had the flu, then 3 wks ago had skin cancer removed ( squamous cell ) R ankle area by Dr Darrick Huntsman . Over stated she is doing well  w no pain today (has arthritis )

## 2017-07-18 LAB — CA 27.29 (SERIAL MONITOR): CA 27.29: 22.6 U/mL (ref 0.0–38.6)

## 2017-09-05 ENCOUNTER — Inpatient Hospital Stay: Payer: Medicare Other | Attending: Oncology

## 2017-09-05 DIAGNOSIS — C50812 Malignant neoplasm of overlapping sites of left female breast: Secondary | ICD-10-CM | POA: Diagnosis present

## 2017-09-05 DIAGNOSIS — Z170 Estrogen receptor positive status [ER+]: Secondary | ICD-10-CM

## 2017-09-05 DIAGNOSIS — C50411 Malignant neoplasm of upper-outer quadrant of right female breast: Secondary | ICD-10-CM | POA: Diagnosis present

## 2017-09-05 DIAGNOSIS — Z452 Encounter for adjustment and management of vascular access device: Secondary | ICD-10-CM | POA: Insufficient documentation

## 2017-09-05 MED ORDER — HEPARIN SOD (PORK) LOCK FLUSH 100 UNIT/ML IV SOLN
500.0000 [IU] | Freq: Once | INTRAVENOUS | Status: AC
  Administered 2017-09-05: 500 [IU] via INTRAVENOUS

## 2017-09-05 MED ORDER — SODIUM CHLORIDE 0.9% FLUSH
10.0000 mL | Freq: Once | INTRAVENOUS | Status: AC
  Administered 2017-09-05: 10 mL via INTRAVENOUS
  Filled 2017-09-05: qty 10

## 2017-10-23 ENCOUNTER — Inpatient Hospital Stay: Payer: Medicare Other | Attending: Oncology

## 2017-10-23 DIAGNOSIS — C50812 Malignant neoplasm of overlapping sites of left female breast: Secondary | ICD-10-CM | POA: Insufficient documentation

## 2017-10-23 DIAGNOSIS — Z95828 Presence of other vascular implants and grafts: Secondary | ICD-10-CM

## 2017-10-23 DIAGNOSIS — Z452 Encounter for adjustment and management of vascular access device: Secondary | ICD-10-CM | POA: Diagnosis present

## 2017-10-23 DIAGNOSIS — C50411 Malignant neoplasm of upper-outer quadrant of right female breast: Secondary | ICD-10-CM | POA: Insufficient documentation

## 2017-10-23 MED ORDER — SODIUM CHLORIDE 0.9% FLUSH
10.0000 mL | INTRAVENOUS | Status: DC | PRN
  Administered 2017-10-23: 10 mL via INTRAVENOUS
  Filled 2017-10-23: qty 10

## 2017-10-23 MED ORDER — HEPARIN SOD (PORK) LOCK FLUSH 100 UNIT/ML IV SOLN
INTRAVENOUS | Status: AC
  Filled 2017-10-23: qty 5

## 2017-10-23 MED ORDER — HEPARIN SOD (PORK) LOCK FLUSH 100 UNIT/ML IV SOLN
500.0000 [IU] | Freq: Once | INTRAVENOUS | Status: AC
  Administered 2017-10-23: 500 [IU] via INTRAVENOUS

## 2017-12-01 ENCOUNTER — Other Ambulatory Visit: Payer: Self-pay | Admitting: *Deleted

## 2017-12-01 DIAGNOSIS — Z170 Estrogen receptor positive status [ER+]: Secondary | ICD-10-CM

## 2017-12-01 DIAGNOSIS — C50812 Malignant neoplasm of overlapping sites of left female breast: Secondary | ICD-10-CM

## 2017-12-04 ENCOUNTER — Inpatient Hospital Stay: Payer: Medicare Other | Attending: Oncology

## 2017-12-04 DIAGNOSIS — C50411 Malignant neoplasm of upper-outer quadrant of right female breast: Secondary | ICD-10-CM | POA: Insufficient documentation

## 2017-12-04 DIAGNOSIS — Z95828 Presence of other vascular implants and grafts: Secondary | ICD-10-CM

## 2017-12-04 DIAGNOSIS — Z452 Encounter for adjustment and management of vascular access device: Secondary | ICD-10-CM | POA: Diagnosis not present

## 2017-12-04 DIAGNOSIS — C50812 Malignant neoplasm of overlapping sites of left female breast: Secondary | ICD-10-CM | POA: Diagnosis present

## 2017-12-04 MED ORDER — HEPARIN SOD (PORK) LOCK FLUSH 100 UNIT/ML IV SOLN
500.0000 [IU] | Freq: Once | INTRAVENOUS | Status: AC
  Administered 2017-12-04: 500 [IU] via INTRAVENOUS

## 2017-12-04 MED ORDER — SODIUM CHLORIDE 0.9% FLUSH
10.0000 mL | Freq: Once | INTRAVENOUS | Status: AC
  Administered 2017-12-04: 10 mL via INTRAVENOUS
  Filled 2017-12-04: qty 10

## 2018-01-17 ENCOUNTER — Ambulatory Visit: Payer: Medicare Other | Admitting: Oncology

## 2018-01-17 ENCOUNTER — Inpatient Hospital Stay: Payer: Medicare Other | Attending: Oncology

## 2018-01-17 DIAGNOSIS — C50812 Malignant neoplasm of overlapping sites of left female breast: Secondary | ICD-10-CM | POA: Diagnosis not present

## 2018-01-17 DIAGNOSIS — Z170 Estrogen receptor positive status [ER+]: Secondary | ICD-10-CM

## 2018-01-17 DIAGNOSIS — Z95828 Presence of other vascular implants and grafts: Secondary | ICD-10-CM

## 2018-01-17 LAB — CBC WITH DIFFERENTIAL/PLATELET
BASOS PCT: 1 %
Basophils Absolute: 0.1 10*3/uL (ref 0–0.1)
Eosinophils Absolute: 0.1 10*3/uL (ref 0–0.7)
Eosinophils Relative: 2 %
HEMATOCRIT: 41.2 % (ref 35.0–47.0)
Hemoglobin: 14.2 g/dL (ref 12.0–16.0)
Lymphocytes Relative: 38 %
Lymphs Abs: 1.6 10*3/uL (ref 1.0–3.6)
MCH: 32 pg (ref 26.0–34.0)
MCHC: 34.5 g/dL (ref 32.0–36.0)
MCV: 92.6 fL (ref 80.0–100.0)
MONO ABS: 0.4 10*3/uL (ref 0.2–0.9)
MONOS PCT: 9 %
NEUTROS ABS: 2.1 10*3/uL (ref 1.4–6.5)
Neutrophils Relative %: 50 %
Platelets: 185 10*3/uL (ref 150–440)
RBC: 4.45 MIL/uL (ref 3.80–5.20)
RDW: 13.6 % (ref 11.5–14.5)
WBC: 4.2 10*3/uL (ref 3.6–11.0)

## 2018-01-17 MED ORDER — HEPARIN SOD (PORK) LOCK FLUSH 100 UNIT/ML IV SOLN
500.0000 [IU] | Freq: Once | INTRAVENOUS | Status: AC
  Administered 2018-01-17: 500 [IU] via INTRAVENOUS

## 2018-01-17 MED ORDER — SODIUM CHLORIDE 0.9% FLUSH
10.0000 mL | Freq: Once | INTRAVENOUS | Status: AC
  Administered 2018-01-17: 10 mL via INTRAVENOUS
  Filled 2018-01-17: qty 10

## 2018-01-18 LAB — CA 27.29 (SERIAL MONITOR): CA 27.29: 17.4 U/mL (ref 0.0–38.6)

## 2018-02-05 NOTE — Progress Notes (Signed)
_ [place]  [phone] [phone]  ID: Stephanie Crosby OB: 25-Sep-1935  MR#: [id number]  GLO#:[id number]  [name], MD as PCP - General (Family Medicine) [name], MD as Consulting Physician (General Surgery)  CHIEF COMPLAINT:   1. Clinical stage IIb ER positive, PR negative, HER-2 overexpressing invasive lobular carcinoma overlapping sites of the left breast, now pathologic stage Ia (T1c,N0,M0). 2. Clinical stage Ia ER/PR positive, HER-2 negative invasive ductal carcinoma of the upper outer quadrant of right breast, now pathologic stage 0.  INTERVAL HISTORY: Patient returns to clinic today for routine six-month evaluation.  She continues to tolerate letrozole and Fosamax well without significant side effects.  Her peripheral neuropathy is chronic and unchanged and does not affect her day-to-day activity. She has no other neurologic complaints.  She denies any recent fevers or illnesses.  She is having problems with her teeth and is being evaluated by dentist.  She has no chest pain or shortness of breath.  She denies nausea, vomiting, diarrhea, or constipation. She has no urinary complaints.  Patient offers no further specific complaints today.  REVIEW OF SYSTEMS:   Review of Systems  Constitutional: Negative.  Negative for fever, malaise/fatigue and weight loss.  HENT: Negative for sore throat.   Eyes: Negative for discharge and redness.  Respiratory: Negative.  Negative for shortness of breath.   Cardiovascular: Negative for chest pain and leg swelling.  Gastrointestinal: Negative for abdominal pain, diarrhea, nausea and vomiting.  Musculoskeletal: Positive for joint pain.  Skin: Negative.  Negative for rash.  Neurological: Positive for tingling and sensory change (Mild neuropathy ). Negative for weakness and headaches.  Psychiatric/Behavioral: Negative.  The patient is not nervous/anxious and does not have  insomnia.    As per HPI. Otherwise, a complete review of systems is negative.   PAST MEDICAL HISTORY: Past Medical History:  Diagnosis Date  . Anemia   . Back injury   . Bilateral breast cancer ([place]) 05/2015   Chemo tx's.  . Breast cancer ([place]) 2017  . Breast cancer in situ 05/2015   Left  . Edema of leg   . GERD (gastroesophageal reflux disease)   . Gonalgia   . Hiatal hernia   . Seasonal allergic rhinitis   . Sleep apnea   . Squamous cell skin cancer 2011   resected from left side of nose and Right axilla area.   . Tearing eyes    SINCE TAKING CHEMO    PAST SURGICAL HISTORY: Past Surgical History:  Procedure Laterality Date  . APPENDECTOMY    . AXILLARY SENTINEL NODE BIOPSY Bilateral 12/10/2015   Procedure: BILATERAL SENTINEL NODE BIOPSY, SENTINNEL NODE INJ.;  Surgeon: [name], MD;  Location: [facility] ORS;  Service: General;  Laterality: Bilateral;  . BREAST BIOPSY Bilateral 11/17/2015   Procedure: BREAST BIOPSY WITH NEEDLE LOCALIZATION;  Surgeon: [name], MD;  Location: [facility] ORS;  Service: General;  Laterality: Bilateral;  . BREAST LUMPECTOMY Bilateral 05/2015   01/17 chemo before radiation after  . CATARACT EXTRACTION, BILATERAL Bilateral 2012  . COLONOSCOPY    . PORTACATH PLACEMENT Right 06/10/2015   Procedure: INSERTION PORT-A-CATH;  Surgeon: [name], MD;  Location: [facility] ORS;  Service: General;  Laterality: Right;    FAMILY HISTORY: Patient reports a maternal aunt with breast cancer as well as a female cousin with breast cancer.  Family History  Problem Relation Age of Onset  . Dementia Mother   . Hyperlipidemia Mother   . Hypertension  Mother   . Congestive Heart Failure Mother   . Heart disease Mother   . Aneurysm Father        abdominal  . Fibromyalgia Sister   . Heart disease Sister   . Cancer Maternal Uncle        brain  . Heart disease Maternal Uncle   . Pancreatic cancer Maternal Grandmother   . Cancer Cousin        female cousin  with breast cancer and brain cancer  . Ovarian cancer Maternal Aunt   . Colon cancer Maternal Aunt   . Breast cancer Neg Hx        ADVANCED DIRECTIVES:    HEALTH MAINTENANCE: Social History   Tobacco Use  . Smoking status: Never Smoker  . Smokeless tobacco: Never Used  Substance Use Topics  . Alcohol use: Yes    Alcohol/week: 0.0 standard drinks    Comment: 3 glasses wine/week  . Drug use: No      Allergies  Allergen Reactions  . Penicillins Rash    Has patient had a PCN reaction causing immediate rash, facial/tongue/throat swelling, SOB or lightheadedness with hypotension: unsure Has patient had a PCN reaction causing severe rash involving mucus membranes or skin necrosis: no Has patient had a PCN reaction that required hospitalization no Has patient had a PCN reaction occurring within the last 10 years: no If all of the above answers are "NO", then may proceed with Cephalosporin use.    Current Outpatient Medications  Medication Sig Dispense Refill  . alendronate (FOSAMAX) 70 MG tablet TAKE 1 TABLET BY MOUTH ONCE A WEEK. TAKEWITH A FULL GLASS OF WATER ONAN EMPTY STOMACH 4 tablet 11  . calcium-vitamin D (CALCIUM 500/D) 500-200 MG-UNIT tablet Take 2 tablets by mouth daily with breakfast.     . fluticasone (FLONASE) 50 MCG/ACT nasal spray Place into both nostrils daily.    . letrozole (FEMARA) 2.5 MG tablet TAKE ONE TABLET BY MOUTH EVERY DAY 30 tablet 6  . lidocaine-prilocaine (EMLA) cream Apply 1 application topically as needed (for chemo therapy port access).     . Multiple Vitamins-Minerals (CENTRUM SILVER PO) Take 1 tablet by mouth daily. Reported on 06/15/2015    . omeprazole (PRILOSEC) 20 MG capsule Take 1 capsule by mouth every morning. Reported on 06/15/2015    . valACYclovir (VALTREX) 500 MG tablet Take 500 mg by mouth daily as needed (break outs). Reported on 06/15/2015    . acetaminophen (TYLENOL) 500 MG tablet Take 500 mg by mouth every 6 (six) hours as needed for  mild pain.    . albuterol (PROVENTIL HFA;VENTOLIN HFA) 108 (90 Base) MCG/ACT inhaler Inhale into the lungs.     No current facility-administered medications for this visit.   1 1  OBJECTIVE: Vitals:   02/08/18 1108  BP: (!) 145/89  Pulse: 83  Resp: 18  Temp: (!) 97.2 F (36.2 C)     Body mass index is 27.76 kg/m.    ECOG FS:0 - Asymptomatic  General: Well-developed, well-nourished, no acute distress. Eyes: Pink conjunctiva, anicteric sclera. HEENT: Normocephalic, moist mucous membranes. Breast: Patient request exam be deferred today. Lungs: Clear to auscultation bilaterally. Heart: Regular rate and rhythm. No rubs, murmurs, or gallops. Abdomen: Soft, nontender, nondistended. No organomegaly noted, normoactive bowel sounds. Musculoskeletal: No edema, cyanosis, or clubbing. Neuro: Alert, answering all questions appropriately. Cranial nerves grossly intact. Skin: No rashes or petechiae noted. Psych: Normal affect.  LAB RESULTS:  Lab Results  Component Value Date     NA 136 01/09/2017   K 3.9 01/09/2017   CL 105 01/09/2017   CO2 24 01/09/2017   GLUCOSE 101 (H) 01/09/2017   BUN 20 01/09/2017   CREATININE 0.66 01/09/2017   CALCIUM 9.5 01/09/2017   PROT 6.9 01/09/2017   ALBUMIN 3.9 01/09/2017   AST 22 01/09/2017   ALT 18 01/09/2017   ALKPHOS 42 01/09/2017   BILITOT 1.6 (H) 01/09/2017   GFRNONAA >60 01/09/2017   GFRAA >60 01/09/2017    Lab Results  Component Value Date   WBC 4.2 01/17/2018   NEUTROABS 2.1 01/17/2018   HGB 14.2 01/17/2018   HCT 41.2 01/17/2018   MCV 92.6 01/17/2018   PLT 185 01/17/2018   Lab Results  Component Value Date   IRON 122 03/14/2016   TIBC 298 03/14/2016   IRONPCTSAT 41 (H) 03/14/2016   Lab Results  Component Value Date   FERRITIN 61 03/14/2016     STUDIES: No results found.  ASSESSMENT:   1. Clinical stage IIb ER positive, PR negative, HER-2 overexpressing invasive lobular carcinoma overlapping sites of the left breast, now  pathologic stage Ia (T1c,N0,M0). 2. Clinical stage Ia ER/PR positive, HER-2 negative invasive ductal carcinoma of the upper outer quadrant of right breast, now pathologic stage 0. 3. BRCA 1 and 2 negative.  PLAN:    1. Clinical stage IIb ER positive, PR negative, HER-2 overexpressing invasive lobular carcinoma overlapping sites of the left breast, now pathologic stage Ia (T1c,N0,M0): Patient noted to have residual stage IA tumor and her left breast with a complete pathologic response in her right breast. Patient completed 6 cycles of chemotherapy in June 2017 and year-long Herceptin maintenance on July 11, 2016. Patient initiated letrozole in March 2018 which she will take for 5 years completing in March 2023. Patient's most recent mammogram on July 11, 2017 was reported as BI-RADS 2.  Repeat in March 2020.  Return to clinic in 6 months for routine evaluation. 2. Clinical stage Ia ER/PR positive, HER-2 negative invasive ductal carcinoma of the upper outer quadrant of right breast, now pathologic stage 0: Complete pathologic response. Patient has completed XRT. Letrozole and mammogram as above. 3. Pulmonary nodule: CT scan results from February 03, 2017 reviewed independently with pulmonary lesion that is unchanged. No intervention is needed.  No further imaging is necessary. 4. Osteoporosis: Patient had a recent bone mineral density on July 13, 2017 that revealed an improved T score of -2.2.  Previously her T score was -2.7 in January 2018. Continue Fosamax, calcium, and vitamin D supplementation.  Repeat in March 2020. 5.  Dental issues: Continue follow-up with dentist as scheduled.  Depending on findings, patient may have to discontinue Fosamax.  She has been instructed to continue treatment for now.  Patient expressed understanding and was in agreement with this plan. She also understands that She can call clinic at any time with any questions, concerns, or complaints.    Lloyd Huger, MD  02/09/18 4:10 PM

## 2018-02-08 ENCOUNTER — Inpatient Hospital Stay: Payer: Medicare Other | Attending: Oncology | Admitting: Oncology

## 2018-02-08 ENCOUNTER — Other Ambulatory Visit: Payer: Self-pay

## 2018-02-08 VITALS — BP 145/89 | HR 83 | Temp 97.2°F | Resp 18 | Wt 172.0 lb

## 2018-02-08 DIAGNOSIS — C50812 Malignant neoplasm of overlapping sites of left female breast: Secondary | ICD-10-CM | POA: Diagnosis not present

## 2018-02-08 DIAGNOSIS — G620 Drug-induced polyneuropathy: Secondary | ICD-10-CM | POA: Diagnosis not present

## 2018-02-08 DIAGNOSIS — Z170 Estrogen receptor positive status [ER+]: Secondary | ICD-10-CM

## 2018-02-08 DIAGNOSIS — K089 Disorder of teeth and supporting structures, unspecified: Secondary | ICD-10-CM

## 2018-02-08 DIAGNOSIS — R911 Solitary pulmonary nodule: Secondary | ICD-10-CM | POA: Diagnosis not present

## 2018-02-08 DIAGNOSIS — C50411 Malignant neoplasm of upper-outer quadrant of right female breast: Secondary | ICD-10-CM | POA: Diagnosis present

## 2018-02-08 DIAGNOSIS — Z79899 Other long term (current) drug therapy: Secondary | ICD-10-CM | POA: Diagnosis not present

## 2018-02-08 DIAGNOSIS — M810 Age-related osteoporosis without current pathological fracture: Secondary | ICD-10-CM | POA: Diagnosis not present

## 2018-02-08 NOTE — Progress Notes (Signed)
Here for follow up.  2 rounds of abx for tooth issues she stated.stated sob noted  Pulse ox 95% on RA -after climbing stairs.

## 2018-03-20 DIAGNOSIS — C50812 Malignant neoplasm of overlapping sites of left female breast: Secondary | ICD-10-CM | POA: Insufficient documentation

## 2018-03-20 DIAGNOSIS — Z170 Estrogen receptor positive status [ER+]: Secondary | ICD-10-CM | POA: Insufficient documentation

## 2018-04-02 ENCOUNTER — Other Ambulatory Visit: Payer: Self-pay | Admitting: Oncology

## 2018-04-02 DIAGNOSIS — R0609 Other forms of dyspnea: Secondary | ICD-10-CM | POA: Insufficient documentation

## 2018-04-04 ENCOUNTER — Ambulatory Visit: Payer: Medicare Other | Admitting: Radiation Oncology

## 2018-04-12 ENCOUNTER — Ambulatory Visit: Payer: Medicare Other | Admitting: Radiation Oncology

## 2018-05-03 ENCOUNTER — Inpatient Hospital Stay: Payer: Medicare Other

## 2018-05-03 ENCOUNTER — Other Ambulatory Visit: Payer: Self-pay | Admitting: *Deleted

## 2018-05-03 DIAGNOSIS — C50919 Malignant neoplasm of unspecified site of unspecified female breast: Secondary | ICD-10-CM

## 2018-05-07 ENCOUNTER — Inpatient Hospital Stay: Payer: Medicare Other | Attending: Oncology

## 2018-05-07 DIAGNOSIS — C50812 Malignant neoplasm of overlapping sites of left female breast: Secondary | ICD-10-CM | POA: Insufficient documentation

## 2018-05-07 DIAGNOSIS — C50411 Malignant neoplasm of upper-outer quadrant of right female breast: Secondary | ICD-10-CM | POA: Insufficient documentation

## 2018-05-07 DIAGNOSIS — Z452 Encounter for adjustment and management of vascular access device: Secondary | ICD-10-CM | POA: Insufficient documentation

## 2018-05-07 DIAGNOSIS — Z95828 Presence of other vascular implants and grafts: Secondary | ICD-10-CM

## 2018-05-07 MED ORDER — SODIUM CHLORIDE 0.9% FLUSH
10.0000 mL | Freq: Once | INTRAVENOUS | Status: AC
  Administered 2018-05-07: 10 mL via INTRAVENOUS
  Filled 2018-05-07: qty 10

## 2018-05-07 MED ORDER — HEPARIN SOD (PORK) LOCK FLUSH 100 UNIT/ML IV SOLN
500.0000 [IU] | Freq: Once | INTRAVENOUS | Status: AC
  Administered 2018-05-07: 500 [IU] via INTRAVENOUS
  Filled 2018-05-07: qty 5

## 2018-05-24 ENCOUNTER — Encounter: Payer: Self-pay | Admitting: Radiation Oncology

## 2018-05-24 ENCOUNTER — Other Ambulatory Visit: Payer: Self-pay

## 2018-05-24 ENCOUNTER — Ambulatory Visit
Admission: RE | Admit: 2018-05-24 | Discharge: 2018-05-24 | Disposition: A | Discharge status: Home / Self Care | Payer: Medicare Other | Source: Ambulatory Visit | Attending: Radiation Oncology | Admitting: Radiation Oncology

## 2018-05-24 DIAGNOSIS — C50411 Malignant neoplasm of upper-outer quadrant of right female breast: Secondary | ICD-10-CM | POA: Diagnosis not present

## 2018-05-24 DIAGNOSIS — Z170 Estrogen receptor positive status [ER+]: Secondary | ICD-10-CM | POA: Diagnosis not present

## 2018-05-24 DIAGNOSIS — D0512 Intraductal carcinoma in situ of left breast: Secondary | ICD-10-CM | POA: Insufficient documentation

## 2018-05-24 DIAGNOSIS — Z923 Personal history of irradiation: Secondary | ICD-10-CM | POA: Diagnosis not present

## 2018-05-24 DIAGNOSIS — R0602 Shortness of breath: Secondary | ICD-10-CM | POA: Insufficient documentation

## 2018-05-24 DIAGNOSIS — Z79811 Long term (current) use of aromatase inhibitors: Secondary | ICD-10-CM | POA: Diagnosis not present

## 2018-05-24 DIAGNOSIS — C50812 Malignant neoplasm of overlapping sites of left female breast: Secondary | ICD-10-CM

## 2018-05-24 NOTE — Progress Notes (Signed)
Radiation Oncology Follow up Note  Name: Stephanie Crosby   Date:   05/24/2018 MRN:  [id number] DOB: 1935-06-17    This 83 y.o. female presents to the clinic today for 2 year follow-up status post bilateral breast radiation.  REFERRING PROVIDER: Maryland Pink, MD  HPI: patient is an 83 year old female now out 2 years having completed whole breast radiation bilaterally. She had invasive lobular carcinoma stage Ia in her right rest and ductal carcinoma in situ ER/PR positive in the left breast. Seen today in routine follow-up she is doing well continues to have some pulling pain and cramping especially 1 pulling her seatbelt over or doing physical activity. She's also recently had some shortness of breath has had a stress test which was unremarkable. She specifically denies breast tenderness cough or bone pain..her last mammogram which I have reviewed was back I Marchwas a BI-RADS 2 benign.She's currently on letrozole tolerating that well without side effect.  COMPLICATIONS OF TREATMENT: none  FOLLOW UP COMPLIANCE: keeps appointments   PHYSICAL EXAM:  BP (!) (P) 142/95 (BP Location: Left Arm, Patient Position: Sitting)   Pulse (P) 92   Temp (P) 98.2 F (36.8 C) (Tympanic)   Resp (P) 16   Wt (P) 169 lb 13.8 oz (77 kg)   BMI (P) 27.42 kg/m  Lungs are clear to A&P cardiac examination essentially unremarkable with regular rate and rhythm. No dominant mass or nodularity is noted in either breast in 2 positions examined. Incision is well-healed. No axillary or supraclavicular adenopathy is appreciated. Cosmetic result is excellent.Well-developed well-nourished patient in NAD. HEENT reveals PERLA, EOMI, discs not visualized.  Oral cavity is clear. No oral mucosal lesions are identified. Neck is clear without evidence of cervical or supraclavicular adenopathy. Lungs are clear to A&P. Cardiac examination is essentially unremarkable with regular rate and rhythm without murmur rub or thrill. Abdomen is  benign with no organomegaly or masses noted. Motor sensory and DTR levels are equal and symmetric in the upper and lower extremities. Cranial nerves II through XII are grossly intact. Proprioception is intact. No peripheral adenopathy or edema is identified. No motor or sensory levels are noted. Crude visual fields are within normal range.  RADIOLOGY RESULTS: mammograms are reviewed and compatible with the above-stated findings  PLAN: present time she is doing well with no evidence of disease.I've suggested some quinine water forher cramps to be used daily. I've asked to see her back in 1 year for follow-up. She continues on letrozole. She or he has follow-up mammograms scheduled for March. Patient is to call with any concerns in any time.  I would like to take this opportunity to thank you for allowing me to participate in the care of your patient.Noreene Filbert, MD

## 2018-07-16 ENCOUNTER — Other Ambulatory Visit: Payer: Self-pay

## 2018-07-16 ENCOUNTER — Ambulatory Visit
Admission: RE | Admit: 2018-07-16 | Discharge: 2018-07-16 | Disposition: A | Discharge status: Home / Self Care | Payer: Medicare Other | Source: Ambulatory Visit | Attending: Oncology | Admitting: Oncology

## 2018-07-16 DIAGNOSIS — M8589 Other specified disorders of bone density and structure, multiple sites: Secondary | ICD-10-CM | POA: Diagnosis not present

## 2018-07-16 DIAGNOSIS — C50812 Malignant neoplasm of overlapping sites of left female breast: Secondary | ICD-10-CM

## 2018-07-16 DIAGNOSIS — Z170 Estrogen receptor positive status [ER+]: Principal | ICD-10-CM

## 2018-07-16 HISTORY — DX: Personal history of antineoplastic chemotherapy: Z92.21

## 2018-07-16 HISTORY — DX: Personal history of irradiation: Z92.3

## 2018-07-17 ENCOUNTER — Inpatient Hospital Stay: Payer: Medicare Other | Attending: Oncology

## 2018-07-17 DIAGNOSIS — Z95828 Presence of other vascular implants and grafts: Secondary | ICD-10-CM

## 2018-07-17 DIAGNOSIS — C50812 Malignant neoplasm of overlapping sites of left female breast: Secondary | ICD-10-CM | POA: Diagnosis not present

## 2018-07-17 DIAGNOSIS — C50411 Malignant neoplasm of upper-outer quadrant of right female breast: Secondary | ICD-10-CM | POA: Diagnosis present

## 2018-07-17 DIAGNOSIS — C50919 Malignant neoplasm of unspecified site of unspecified female breast: Secondary | ICD-10-CM

## 2018-07-17 DIAGNOSIS — Z452 Encounter for adjustment and management of vascular access device: Secondary | ICD-10-CM | POA: Insufficient documentation

## 2018-07-17 MED ORDER — SODIUM CHLORIDE 0.9% FLUSH
10.0000 mL | Freq: Once | INTRAVENOUS | Status: AC
  Administered 2018-07-17: 10 mL via INTRAVENOUS
  Filled 2018-07-17: qty 10

## 2018-07-17 MED ORDER — HEPARIN SOD (PORK) LOCK FLUSH 100 UNIT/ML IV SOLN
500.0000 [IU] | Freq: Once | INTRAVENOUS | Status: AC
  Administered 2018-07-17: 500 [IU] via INTRAVENOUS

## 2018-07-23 ENCOUNTER — Other Ambulatory Visit: Payer: Self-pay | Admitting: Oncology

## 2018-07-23 DIAGNOSIS — C50411 Malignant neoplasm of upper-outer quadrant of right female breast: Secondary | ICD-10-CM

## 2018-07-23 DIAGNOSIS — Z170 Estrogen receptor positive status [ER+]: Principal | ICD-10-CM

## 2018-08-12 NOTE — Progress Notes (Signed)
_ [name]  [phone] [phone]  ID: Stephanie Crosby OB: Jan 22, 1936  MR#: [id number]  IEP#:[id number]  [name], MD as PCP - General (Family Medicine) [name], MD as Consulting Physician (General Surgery)  I connected with Stephanie Crosby on 08/14/18 at 11:00 AM EDT by video enabled telemedicine visit and verified that I am speaking with the correct person using two identifiers.   I discussed the limitations, risks, security and privacy concerns of performing an evaluation and management service by telemedicine and the availability of in-person appointments. I also discussed with the patient that there may be a patient responsible charge related to this service. The patient expressed understanding and agreed to proceed.   Other persons participating in the visit and their role in the encounter: Patient, nursing  Patient's location: Home Provider's location: Clinic   CHIEF COMPLAINT:   1. Clinical stage IIb ER positive, PR negative, HER-2 overexpressing invasive lobular carcinoma overlapping sites of the left breast, now pathologic stage Ia (T1c,N0,M0). 2. Clinical stage Ia ER/PR positive, HER-2 negative invasive ductal carcinoma of the upper outer quadrant of right breast, now pathologic stage 0.  INTERVAL HISTORY: Patient agreed to WebEx visit today for further evaluation and routine 35-monthfollow-up.  She continues to complain of mild weight gain with letrozole, but otherwise is tolerating her treatments well. Her peripheral neuropathy is chronic and unchanged and does not affect her day-to-day activity. She has no other neurologic complaints.  She denies any recent fevers or illnesses.  She denies any chest pain, shortness of breath, cough, or hemoptysis.  She denies nausea, vomiting, diarrhea, or constipation. She has no urinary complaints.  Patient feels at her baseline offers no further specific complaints  today.  REVIEW OF SYSTEMS:   Review of Systems  Constitutional: Negative.  Negative for fever, malaise/fatigue and weight loss.  HENT: Negative for sore throat.   Eyes: Negative for discharge and redness.  Respiratory: Negative.  Negative for shortness of breath.   Cardiovascular: Negative for chest pain and leg swelling.  Gastrointestinal: Negative for abdominal pain, diarrhea, nausea and vomiting.  Musculoskeletal: Positive for joint pain.  Skin: Negative.  Negative for rash.  Neurological: Positive for tingling and sensory change (Mild neuropathy ). Negative for weakness and headaches.  Psychiatric/Behavioral: Negative.  The patient is not nervous/anxious and does not have insomnia.    As per HPI. Otherwise, a complete review of systems is negative.   PAST MEDICAL HISTORY: Past Medical History:  Diagnosis Date  . Anemia   . Back injury   . [place]   Chemo tx's.  . [place]  . Breast cancer in situ 05/2015   Left  . Edema of leg   . GERD (gastroesophageal reflux disease)   . Gonalgia   . Hiatal hernia   . Personal history of chemotherapy   . Personal history of radiation therapy   . Seasonal allergic rhinitis   . Sleep apnea   . Squamous cell skin cancer 2011   resected from left side of nose and Right axilla area.   . Tearing eyes    SINCE TAKING CHEMO    PAST SURGICAL HISTORY: Past Surgical History:  Procedure Laterality Date  . APPENDECTOMY    . AXILLARY SENTINEL NODE BIOPSY Bilateral 12/10/2015   Procedure: BILATERAL SENTINEL NODE BIOPSY, SENTINNEL NODE INJ.;  Surgeon: CClayburn Pert MD;  Location: [facility] ORS;  Service: General;  Laterality: Bilateral;  .  BREAST BIOPSY Bilateral 11/17/2015   Procedure: BREAST BIOPSY WITH NEEDLE LOCALIZATION;  Surgeon: [name], MD;  Location: [facility] ORS;  Service: General;  Laterality: Bilateral;  . BREAST LUMPECTOMY Bilateral 05/2015   01/17 chemo before radiation after  .  CATARACT EXTRACTION, BILATERAL Bilateral 2012  . COLONOSCOPY    . PORTACATH PLACEMENT Right 06/10/2015   Procedure: INSERTION PORT-A-CATH;  Surgeon: [name], MD;  Location: [facility] ORS;  Service: General;  Laterality: Right;    FAMILY HISTORY: Patient reports a maternal aunt with breast cancer as well as a female cousin with breast cancer.  Family History  Problem Relation Age of Onset  . Dementia Mother   . Hyperlipidemia Mother   . Hypertension Mother   . Congestive Heart Failure Mother   . Heart disease Mother   . Aneurysm Father        abdominal  . Fibromyalgia Sister   . Heart disease Sister   . Cancer Maternal Uncle        brain  . Heart disease Maternal Uncle   . Pancreatic cancer Maternal Grandmother   . Cancer Cousin        female cousin with breast cancer and brain cancer  . Ovarian cancer Maternal Aunt   . Colon cancer Maternal Aunt   . Breast cancer Neg Hx        ADVANCED DIRECTIVES:    HEALTH MAINTENANCE: Social History   Tobacco Use  . Smoking status: Never Smoker  . Smokeless tobacco: Never Used  Substance Use Topics  . Alcohol use: Yes    Alcohol/week: 0.0 standard drinks    Comment: 3 glasses wine/week  . Drug use: No      Allergies  Allergen Reactions  . Penicillins Rash    Has patient had a PCN reaction causing immediate rash, facial/tongue/throat swelling, SOB or lightheadedness with hypotension: unsure Has patient had a PCN reaction causing severe rash involving mucus membranes or skin necrosis: no Has patient had a PCN reaction that required hospitalization no Has patient had a PCN reaction occurring within the last 10 years: no If all of the above answers are "NO", then may proceed with Cephalosporin use.    Current Outpatient Medications  Medication Sig Dispense Refill  . acetaminophen (TYLENOL) 500 MG tablet Take 500 mg by mouth every 6 (six) hours as needed for mild pain.    Marland Kitchen alendronate (FOSAMAX) 70 MG tablet TAKE 1 TABLET EVERY  7 DAYS WITH A FULL GLASS OF WATER ON AN EMPTY STOMACH DO NOT LIE DOWN FOR AT LEAST 30 MIN 4 tablet 11  . azelastine (ASTELIN) 0.1 % nasal spray     . benzonatate (TESSALON) 200 MG capsule     . calcium-vitamin D (CALCIUM 500/D) 500-200 MG-UNIT tablet Take 2 tablets by mouth daily with breakfast.     . guaiFENesin-codeine 100-10 MG/5ML syrup     . letrozole (FEMARA) 2.5 MG tablet TAKE ONE TABLET BY MOUTH EVERY DAY 30 tablet 6  . lidocaine-prilocaine (EMLA) cream Apply 1 application topically as needed (for chemo therapy port access).     . Multiple Vitamins-Minerals (CENTRUM SILVER PO) Take 1 tablet by mouth daily. Reported on 06/15/2015    . omeprazole (PRILOSEC) 20 MG capsule Take 1 capsule by mouth every morning. Reported on 06/15/2015    . valACYclovir (VALTREX) 500 MG tablet Take 500 mg by mouth daily as needed (break outs). Reported on 06/15/2015    . albuterol (PROVENTIL HFA;VENTOLIN HFA) 108 (90 Base) MCG/ACT inhaler Inhale  into the lungs.     No current facility-administered medications for this visit.   1 1  OBJECTIVE: There were no vitals filed for this visit.   There is no height or weight on file to calculate BMI.    ECOG FS:0 - Asymptomatic  General: Well-developed, well-nourished, no acute distress. Eyes: Pink conjunctiva, anicteric sclera. HEENT: Normocephalic, moist mucous membranes. Neuro: Alert, answering all questions appropriately. Cranial nerves grossly intact. Skin: No rashes or petechiae noted. Psych: Normal affect.   LAB RESULTS:  Lab Results  Component Value Date   NA 136 01/09/2017   K 3.9 01/09/2017   CL 105 01/09/2017   CO2 24 01/09/2017   GLUCOSE 101 (H) 01/09/2017   BUN 20 01/09/2017   CREATININE 0.66 01/09/2017   CALCIUM 9.5 01/09/2017   PROT 6.9 01/09/2017   ALBUMIN 3.9 01/09/2017   AST 22 01/09/2017   ALT 18 01/09/2017   ALKPHOS 42 01/09/2017   BILITOT 1.6 (H) 01/09/2017   GFRNONAA >60 01/09/2017   GFRAA >60 01/09/2017    Lab Results   Component Value Date   WBC 4.2 01/17/2018   NEUTROABS 2.1 01/17/2018   HGB 14.2 01/17/2018   HCT 41.2 01/17/2018   MCV 92.6 01/17/2018   PLT 185 01/17/2018   Lab Results  Component Value Date   IRON 122 03/14/2016   TIBC 298 03/14/2016   IRONPCTSAT 41 (H) 03/14/2016   Lab Results  Component Value Date   FERRITIN 61 03/14/2016     STUDIES: Dg Bone Density  Result Date: 07/16/2018 EXAM: DUAL X-RAY ABSORPTIOMETRY (DXA) FOR BONE MINERAL DENSITY IMPRESSION: Dear Dr Grayland Ormond, PATIENT BIOGRAPHICAL: Name: Stephanie Crosby, Stephanie Crosby Patient ID: [id number] Birth Date: 31-Mar-1936 Height: 65.5 in. Gender: Female Exam Date: 07/16/2018 Weight: 172.2 lbs. Indications: Advanced Age, Breast CA, Caucasian, Height Loss, High Risk Meds, History of Fracture (Adult), Parent Hip Fracture, Postmenopausal, Previous Chemo and Radiation Fractures: Right foot, coccyx Treatments: CALCIUM VIT D, Femara, Fosamax, Multi-Vitamin with calcium, Omeprazole ASSESSMENT: The BMD measured at Femur Total Right is 0.746 g/cm2 with a T-score of -2.1. This patient is considered OSTEOPENIC according to [facility]) criteria. The quality of the scan is good. Patient is not a candidate for FRAX due to Fosamax therapy. Site Region Measured Measured WHO Young Adult BMD Date       Age      Classification T-score AP Spine L1-L4 07/16/2018 81.1 Osteopenia -2.1 0.935 g/cm2 AP Spine L1-L4 07/13/2017 80.1 Osteopenia -2.2 0.919 g/cm2 AP Spine L1-L4 06/01/2016 79.0 Osteoporosis -2.7 0.856 g/cm2 DualFemur Total Right 07/16/2018 81.1 Osteopenia -2.1 0.746 g/cm2 DualFemur Total Right 07/13/2017 80.1 Osteopenia -2.1 0.742 g/cm2 DualFemur Total Right 06/01/2016 79.0 Osteoporosis -2.6 0.679 g/cm2 DualFemur Total Mean 07/16/2018 81.1 Osteopenia -1.8 0.776 g/cm2 DualFemur Total Mean 07/13/2017 80.1 Osteopenia -1.9 0.766 g/cm2 DualFemur Total Mean 06/01/2016 79.0 Osteopenia -2.3 0.721 g/cm2 World Health Organization [facility]) criteria for  post-menopausal, Caucasian Women: Normal:       T-score at or above -1 SD Osteopenia:   T-score between -1 and -2.5 SD Osteoporosis: T-score at or below -2.5 SD RECOMMENDATIONS: 1. All patients should optimize calcium and vitamin D intake. 2. Consider FDA-approved medical therapies in postmenopausal women and men aged 40 years and older, based on the following: a. A hip or vertebral(clinical or morphometric) fracture b. T-score < -2.5 at the femoral neck or spine after appropriate evaluation to exclude secondary causes c. Low bone mass (T-score between -1.0 and -2.5 at the femoral neck or spine) and a  10-year probability of a hip fracture > 3% or a 10-year probability of a major osteoporosis-related fracture > 20% based on the [place]-adapted WHO algorithm d. Clinician judgment and/or patient preferences may indicate treatment for people with 10-year fracture probabilities above or below these levels FOLLOW-UP: People with diagnosed cases of osteoporosis or at high risk for fracture should have regular bone mineral density tests. For patients eligible for Medicare, routine testing is allowed once every 2 years. The testing frequency can be increased to one year for patients who have rapidly progressing disease, those who are receiving or discontinuing medical therapy to restore bone mass, or have additional risk factors. I have reviewed this report, and agree with the above findings. Mark A. Thornton Papas, M.D. [facility] Radiology Electronically Signed   By: Lavonia Dana M.D.   On: 07/16/2018 10:49   Mm Diag Breast Tomo Bilateral  Result Date: 07/16/2018 CLINICAL DATA:  History of treated bilateral breast cancer, post lumpectomy radiation and chemotherapy in 2017. EXAM: DIGITAL DIAGNOSTIC BILATERAL MAMMOGRAM WITH CAD AND TOMO COMPARISON:  Previous exam(s). ACR Breast Density Category b: There are scattered areas of fibroglandular density. FINDINGS: Mammographically, there are no suspicious masses, areas of nonsurgical  architectural distortion or microcalcifications in either breast. Stable post treatment changes in both breasts. Injectable port overlies the right axilla. Mammographic images were processed with CAD. IMPRESSION: No mammographic evidence of malignancy in either breast, post bilateral lumpectomies. RECOMMENDATION: Diagnostic mammogram is suggested in 1 year. (Code:DM-B-01Y) I have discussed the findings and recommendations with the patient. Results were also provided in writing at the conclusion of the visit. If applicable, a reminder letter will be sent to the patient regarding the next appointment. BI-RADS CATEGORY  2: Benign. Electronically Signed   By: Fidela Salisbury M.D.   On: 07/16/2018 10:48    ASSESSMENT:   1. Clinical stage IIb ER positive, PR negative, HER-2 overexpressing invasive lobular carcinoma overlapping sites of the left breast, now pathologic stage Ia (T1c,N0,M0). 2. Clinical stage Ia ER/PR positive, HER-2 negative invasive ductal carcinoma of the upper outer quadrant of right breast, now pathologic stage 0. 3. BRCA 1 and 2 negative.  PLAN:    1. Clinical stage IIb ER positive, PR negative, HER-2 overexpressing invasive lobular carcinoma overlapping sites of the left breast, now pathologic stage Ia (T1c,N0,M0): Patient noted to have residual stage IA tumor and her left breast with a complete pathologic response in her right breast. Patient completed 6 cycles of chemotherapy in June 2017 and year-long Herceptin maintenance on July 11, 2016.  Continue letrozole for a total of 5 years completing in March 2023.  Her most recent mammogram on July 16, 2018 was reported as BI-RADS 2.  Repeat in March 2021.  Return to clinic in 6 months for routine evaluation.  2. Clinical stage Ia ER/PR positive, HER-2 negative invasive ductal carcinoma of the upper outer quadrant of right breast, now pathologic stage 0: Complete pathologic response. Patient has completed XRT. Letrozole and mammogram as  above. 3. Pulmonary nodule: CT scan results from February 03, 2017 reviewed independently with pulmonary lesion that is unchanged. No intervention is needed.  No further imaging is necessary. 4. Osteoporosis: Patient's most recent bone mineral density on July 16, 2018 revealed continue improvement of her T score to -2.1.  Previously her T score was -2.7 in January 2018. Continue Fosamax, calcium, and vitamin D supplementation.  Repeat in March 2021. 5.  Dental issues: Patient did not complain of this today.  I provided 25 minutes  of face-to-face video visit time during this encounter, and > 50% was spent counseling as documented under my assessment & plan.   Patient expressed understanding and was in agreement with this plan. She also understands that She can call clinic at any time with any questions, concerns, or complaints.    Lloyd Huger, MD 08/14/18 7:00 AM

## 2018-08-13 ENCOUNTER — Inpatient Hospital Stay: Payer: Medicare Other | Attending: Oncology | Admitting: Oncology

## 2018-08-13 ENCOUNTER — Other Ambulatory Visit: Payer: Self-pay

## 2018-08-13 ENCOUNTER — Encounter: Payer: Self-pay | Admitting: Oncology

## 2018-08-13 DIAGNOSIS — Z9221 Personal history of antineoplastic chemotherapy: Secondary | ICD-10-CM

## 2018-08-13 DIAGNOSIS — C50812 Malignant neoplasm of overlapping sites of left female breast: Secondary | ICD-10-CM | POA: Diagnosis not present

## 2018-08-13 DIAGNOSIS — C50411 Malignant neoplasm of upper-outer quadrant of right female breast: Secondary | ICD-10-CM | POA: Diagnosis not present

## 2018-08-13 DIAGNOSIS — M810 Age-related osteoporosis without current pathological fracture: Secondary | ICD-10-CM

## 2018-08-13 DIAGNOSIS — Z170 Estrogen receptor positive status [ER+]: Secondary | ICD-10-CM | POA: Diagnosis not present

## 2018-08-13 DIAGNOSIS — Z79811 Long term (current) use of aromatase inhibitors: Secondary | ICD-10-CM

## 2018-08-13 NOTE — Progress Notes (Signed)
Patient virtual visit today for breast cancer. Patient request port removal. Patient reports weight gain on letrozole.

## 2019-01-09 ENCOUNTER — Other Ambulatory Visit: Payer: Self-pay | Admitting: *Deleted

## 2019-01-09 DIAGNOSIS — Z95828 Presence of other vascular implants and grafts: Secondary | ICD-10-CM

## 2019-01-23 ENCOUNTER — Ambulatory Visit: Payer: Self-pay | Admitting: Surgery

## 2019-01-28 ENCOUNTER — Other Ambulatory Visit: Payer: Self-pay

## 2019-01-28 ENCOUNTER — Ambulatory Visit (INDEPENDENT_AMBULATORY_CARE_PROVIDER_SITE_OTHER): Payer: Medicare Other | Admitting: Surgery

## 2019-01-28 ENCOUNTER — Encounter: Payer: Self-pay | Admitting: Surgery

## 2019-01-28 VITALS — BP 162/102 | HR 92 | Temp 97.5°F | Ht 66.0 in | Wt 174.2 lb

## 2019-01-28 DIAGNOSIS — C50911 Malignant neoplasm of unspecified site of right female breast: Secondary | ICD-10-CM | POA: Insufficient documentation

## 2019-01-28 DIAGNOSIS — K449 Diaphragmatic hernia without obstruction or gangrene: Secondary | ICD-10-CM | POA: Insufficient documentation

## 2019-01-28 DIAGNOSIS — C50812 Malignant neoplasm of overlapping sites of left female breast: Secondary | ICD-10-CM | POA: Diagnosis not present

## 2019-01-28 DIAGNOSIS — K219 Gastro-esophageal reflux disease without esophagitis: Secondary | ICD-10-CM | POA: Insufficient documentation

## 2019-01-28 DIAGNOSIS — Z170 Estrogen receptor positive status [ER+]: Secondary | ICD-10-CM

## 2019-01-28 NOTE — Patient Instructions (Signed)
Please see your follow up appointment listed below.  °

## 2019-01-30 ENCOUNTER — Encounter: Payer: Self-pay | Admitting: Surgery

## 2019-01-30 NOTE — Progress Notes (Signed)
Patient ID: Stephanie Crosby, female   DOB: January 05, 1936, 83 y.o.   MRN: [id number]  HPI Stephanie Crosby is a 83 y.o. female s/p port placement lummpectomy and SLNBx for breast CA. She is doing well, no masses. Completed chemo and wishes to have port remove at some point in time. She does have left jaw pain, no fevers or chills. She does have f/u w dentist  HPI  Past Medical History:  Diagnosis Date  . Anemia   . Back injury   . Bilateral breast cancer ([place]) 05/2015   Chemo tx's.  . Breast cancer ([place]) 2017  . Breast cancer in situ 05/2015   Left  . Edema of leg   . GERD (gastroesophageal reflux disease)   . Gonalgia   . Hiatal hernia   . Personal history of chemotherapy   . Personal history of radiation therapy   . Seasonal allergic rhinitis   . Sleep apnea   . Squamous cell skin cancer 2011   resected from left side of nose and Right axilla area.   . Tearing eyes    SINCE TAKING CHEMO    Past Surgical History:  Procedure Laterality Date  . APPENDECTOMY    . AXILLARY SENTINEL NODE BIOPSY Bilateral 12/10/2015   Procedure: BILATERAL SENTINEL NODE BIOPSY, SENTINNEL NODE INJ.;  Surgeon: [name], MD;  Location: [facility] ORS;  Service: General;  Laterality: Bilateral;  . BREAST BIOPSY Bilateral 11/17/2015   Procedure: BREAST BIOPSY WITH NEEDLE LOCALIZATION;  Surgeon: [name], MD;  Location: [facility] ORS;  Service: General;  Laterality: Bilateral;  . BREAST LUMPECTOMY Bilateral 05/2015   01/17 chemo before radiation after  . CATARACT EXTRACTION, BILATERAL Bilateral 2012  . COLONOSCOPY    . PORTACATH PLACEMENT Right 06/10/2015   Procedure: INSERTION PORT-A-CATH;  Surgeon: [name], MD;  Location: [facility] ORS;  Service: General;  Laterality: Right;    Family History  Problem Relation Age of Onset  . Dementia Mother   . Hyperlipidemia Mother   . Hypertension Mother   . Congestive Heart Failure Mother   . Heart disease Mother   . Aneurysm Father        abdominal  .  Fibromyalgia Sister   . Heart disease Sister   . Cancer Maternal Uncle        brain  . Heart disease Maternal Uncle   . Pancreatic cancer Maternal Grandmother   . Cancer Cousin        female cousin with breast cancer and brain cancer  . Ovarian cancer Maternal Aunt   . Colon cancer Maternal Aunt   . Breast cancer Neg Hx     Social History Social History   Tobacco Use  . Smoking status: Never Smoker  . Smokeless tobacco: Never Used  Substance Use Topics  . Alcohol use: Yes    Alcohol/week: 0.0 standard drinks    Comment: 3 glasses wine/week  . Drug use: No    Allergies  Allergen Reactions  . Penicillins Rash    Has patient had a PCN reaction causing immediate rash, facial/tongue/throat swelling, SOB or lightheadedness with hypotension: unsure Has patient had a PCN reaction causing severe rash involving mucus membranes or skin necrosis: no Has patient had a PCN reaction that required hospitalization no Has patient had a PCN reaction occurring within the last 10 years: no If all of the above answers are "NO", then may proceed with Cephalosporin use.    Current Outpatient Medications  Medication Sig Dispense Refill  . acetaminophen (  TYLENOL) 500 MG tablet Take 500 mg by mouth every 6 (six) hours as needed for mild pain.    Marland Kitchen alendronate (FOSAMAX) 70 MG tablet TAKE 1 TABLET EVERY 7 DAYS WITH A FULL GLASS OF WATER ON AN EMPTY STOMACH DO NOT LIE DOWN FOR AT LEAST 30 MIN 4 tablet 11  . azelastine (ASTELIN) 0.1 % nasal spray     . benzonatate (TESSALON) 200 MG capsule     . calcium-vitamin D (CALCIUM 500/D) 500-200 MG-UNIT tablet Take 2 tablets by mouth daily with breakfast.     . letrozole (FEMARA) 2.5 MG tablet TAKE ONE TABLET BY MOUTH EVERY DAY 30 tablet 6  . Multiple Vitamins-Minerals (CENTRUM SILVER PO) Take 1 tablet by mouth daily. Reported on 06/15/2015    . omeprazole (PRILOSEC) 20 MG capsule Take 1 capsule by mouth every morning. Reported on 06/15/2015    . valACYclovir  (VALTREX) 500 MG tablet Take 500 mg by mouth daily as needed (break outs). Reported on 06/15/2015     No current facility-administered medications for this visit.      Review of Systems Full ROS  was asked and was negative except for the information on the HPI  Physical Exam Blood pressure (!) 162/102, pulse 92, temperature (!) 97.5 F (36.4 C), temperature source Temporal, height 5\' 6"  (1.676 m), weight 174 lb 3.2 oz (79 kg), SpO2 95 %. CONSTITUTIONAL: NAD EYES: Pupils are equal, round, and reactive to light, Sclera are non-icteric. EARS, NOSE, MOUTH AND THROAT: The oropharynx is clear. The oral mucosa is pink and moist. Hearing is intact to voice. Neck: Left Temporomandibular joint w pain but no abscess erythema and masses LYMPH NODES:  Lymph nodes in the neck are normal. RESPIRATORY:  Lungs are clear. There is normal respiratory effort, with equal breath sounds bilaterally, and without pathologic use of accessory muscles. CARDIOVASCULAR: Heart is regular without murmurs, gallops, or rubs. Port in place  GI: The abdomen is  soft, nontender, and nondistended. There are no palpable masses. There is no hepatosplenomegaly. There are normal bowel sounds in all quadrants. GU: Rectal deferred.   MUSCULOSKELETAL: Normal muscle strength and tone. No cyanosis or edema.   SKIN: Turgor is good and there are no pathologic skin lesions or ulcers. NEUROLOGIC: Motor and sensation is grossly normal. Cranial nerves are grossly intact. PSYCH:  Oriented to person, place and time. Affect is normal.  Data Reviewed  I have personally reviewed the patient's imaging, laboratory findings and medical records.    Assessment/Plan 83 year old female with a prior history of port placement.  She wishes to have this removed as she completed chemotherapy.  Currently she is got significant temporomandibular joint pain on the left side.  She is about to see a dentist in about 2 to 3 weeks she will like to have a port  removal schedule here in the office.  Procedure discussed with patient detail.  Risk benefit and possible applications.  She understands.  Caroleen Hamman, MD FACS General Surgeon 01/30/2019, 2:35 PM

## 2019-02-11 NOTE — Progress Notes (Signed)
_ Hunt  Telephone:([phone] [phone]  ID: Stephanie Crosby OB: 1935/06/02  MR#: [id number]  GDJ#:[id number]  [name], MD as PCP - General (Family Medicine) [name], MD as Consulting Physician (General Surgery)   CHIEF COMPLAINT:   1. Clinical stage IIb ER positive, PR negative, HER-2 overexpressing invasive lobular carcinoma overlapping sites of the left breast, now pathologic stage Ia (T1c,N0,M0). 2. Clinical stage Ia ER/PR positive, HER-2 negative invasive ductal carcinoma of the upper outer quadrant of right breast, now pathologic stage 0.  INTERVAL HISTORY: Patient returns to clinic today for routine 59-monthevaluation.  She continues to complain of minimal weight gain with letrozole, but otherwise feels well.  She was having increased joint pain, but after discontinuing treatment for 1 month this did not resolve so she received reinitiated letrozole. Her peripheral neuropathy is chronic and unchanged and does not affect her day-to-day activity. She has no other neurologic complaints.  She has increased jaw pain at her TMJ joint.  She denies any recent fevers or illnesses.  She denies any chest pain, shortness of breath, cough, or hemoptysis.  She denies nausea, vomiting, diarrhea, or constipation. She has no urinary complaints.  Patient otherwise feels well and offers no further specific complaints today.  REVIEW OF SYSTEMS:   Review of Systems  Constitutional: Negative.  Negative for fever, malaise/fatigue and weight loss.  HENT: Negative for sore throat.   Eyes: Negative for discharge and redness.  Respiratory: Negative.  Negative for shortness of breath.   Cardiovascular: Negative for chest pain and leg swelling.  Gastrointestinal: Negative for abdominal pain, diarrhea, nausea and vomiting.  Musculoskeletal: Positive for joint pain.  Skin: Negative.  Negative for rash.  Neurological: Positive for tingling  and sensory change (Mild neuropathy ). Negative for weakness and headaches.  Psychiatric/Behavioral: Negative.  The patient is not nervous/anxious and does not have insomnia.    As per HPI. Otherwise, a complete review of systems is negative.   PAST MEDICAL HISTORY: Past Medical History:  Diagnosis Date  . Anemia   . Back injury   [place]   [place] tx's.  . [place]  . Breast cancer in situ 05/2015   Left  . Edema of leg   . GERD (gastroesophageal reflux disease)   . Gonalgia   . Hiatal hernia   . Personal history of chemotherapy   . Personal history of radiation therapy   . Seasonal allergic rhinitis   . Sleep apnea   . Squamous cell skin cancer 2011   resected from left side of nose and Right axilla area.   . Tearing eyes    SINCE TAKING CHEMO    PAST SURGICAL HISTORY: Past Surgical History:  Procedure Laterality Date  . APPENDECTOMY    . AXILLARY SENTINEL NODE BIOPSY Bilateral 12/10/2015   Procedure: BILATERAL SENTINEL NODE BIOPSY, SENTINNEL NODE INJ.;  Surgeon: CClayburn Pert MD;  Location: [facility] ORS;  Service: General;  Laterality: Bilateral;  . BREAST BIOPSY Bilateral 11/17/2015   Procedure: BREAST BIOPSY WITH NEEDLE LOCALIZATION;  Surgeon: CClayburn Pert MD;  Location: [facility] ORS;  Service: General;  Laterality: Bilateral;  . BREAST LUMPECTOMY Bilateral 05/2015   01/17 chemo before radiation after  . CATARACT EXTRACTION, BILATERAL Bilateral 2012  . COLONOSCOPY    . PORTACATH PLACEMENT Right 06/10/2015   Procedure: INSERTION PORT-A-CATH;  Surgeon: CClayburn Pert MD;  Location: [facility] ORS;  Service: General;  Laterality: Right;  FAMILY HISTORY: Patient reports a maternal aunt with breast cancer as well as a female cousin with breast cancer.  Family History  Problem Relation Age of Onset  . Dementia Mother   . Hyperlipidemia Mother   . Hypertension Mother   . Congestive Heart Failure Mother   . Heart disease Mother    . Aneurysm Father        abdominal  . Fibromyalgia Sister   . Heart disease Sister   . Cancer Maternal Uncle        brain  . Heart disease Maternal Uncle   . Pancreatic cancer Maternal Grandmother   . Cancer Cousin        female cousin with breast cancer and brain cancer  . Ovarian cancer Maternal Aunt   . Colon cancer Maternal Aunt   . Breast cancer Neg Hx        ADVANCED DIRECTIVES:    HEALTH MAINTENANCE: Social History   Tobacco Use  . Smoking status: Never Smoker  . Smokeless tobacco: Never Used  Substance Use Topics  . Alcohol use: Yes    Alcohol/week: 0.0 standard drinks    Comment: 3 glasses wine/week  . Drug use: No      Allergies  Allergen Reactions  . Penicillins Rash    Has patient had a PCN reaction causing immediate rash, facial/tongue/throat swelling, SOB or lightheadedness with hypotension: unsure Has patient had a PCN reaction causing severe rash involving mucus membranes or skin necrosis: no Has patient had a PCN reaction that required hospitalization no Has patient had a PCN reaction occurring within the last 10 years: no If all of the above answers are "NO", then may proceed with Cephalosporin use.    Current Outpatient Medications  Medication Sig Dispense Refill  . acetaminophen (TYLENOL) 500 MG tablet Take 500 mg by mouth every 6 (six) hours as needed for mild pain.    Marland Kitchen alendronate (FOSAMAX) 70 MG tablet TAKE 1 TABLET EVERY 7 DAYS WITH A FULL GLASS OF WATER ON AN EMPTY STOMACH DO NOT LIE DOWN FOR AT LEAST 30 MIN 4 tablet 11  . azelastine (ASTELIN) 0.1 % nasal spray     . benzonatate (TESSALON) 200 MG capsule     . calcium-vitamin D (CALCIUM 500/D) 500-200 MG-UNIT tablet Take 2 tablets by mouth daily with breakfast.     . letrozole (FEMARA) 2.5 MG tablet TAKE ONE TABLET BY MOUTH EVERY DAY 30 tablet 6  . Multiple Vitamins-Minerals (CENTRUM SILVER PO) Take 1 tablet by mouth daily. Reported on 06/15/2015    . omeprazole (PRILOSEC) 20 MG capsule  Take 1 capsule by mouth every morning. Reported on 06/15/2015    . valACYclovir (VALTREX) 500 MG tablet Take 500 mg by mouth daily as needed (break outs). Reported on 06/15/2015     No current facility-administered medications for this visit.   1 1  OBJECTIVE: Vitals:   02/12/19 1049 02/12/19 1054  BP: (!) 185/107 (!) 162/106  Pulse: 86 85  Resp: 18   Temp: 98.2 F (36.8 C)      Body mass index is 27.99 kg/m.    ECOG FS:0 - Asymptomatic  General: Well-developed, well-nourished, no acute distress. Eyes: Pink conjunctiva, anicteric sclera. HEENT: Normocephalic, moist mucous membranes. Breast: Patient declined breast exam today. Lungs: Clear to auscultation bilaterally. Heart: Regular rate and rhythm. No rubs, murmurs, or gallops. Abdomen: Soft, nontender, nondistended. No organomegaly noted, normoactive bowel sounds. Musculoskeletal: No edema, cyanosis, or clubbing. Neuro: Alert, answering all questions appropriately. Cranial  nerves grossly intact. Skin: No rashes or petechiae noted. Psych: Normal affect.  LAB RESULTS:  Lab Results  Component Value Date   NA 136 01/09/2017   K 3.9 01/09/2017   CL 105 01/09/2017   CO2 24 01/09/2017   GLUCOSE 101 (H) 01/09/2017   BUN 20 01/09/2017   CREATININE 0.66 01/09/2017   CALCIUM 9.5 01/09/2017   PROT 6.9 01/09/2017   ALBUMIN 3.9 01/09/2017   AST 22 01/09/2017   ALT 18 01/09/2017   ALKPHOS 42 01/09/2017   BILITOT 1.6 (H) 01/09/2017   GFRNONAA >60 01/09/2017   GFRAA >60 01/09/2017    Lab Results  Component Value Date   WBC 4.2 01/17/2018   NEUTROABS 2.1 01/17/2018   HGB 14.2 01/17/2018   HCT 41.2 01/17/2018   MCV 92.6 01/17/2018   PLT 185 01/17/2018   Lab Results  Component Value Date   IRON 122 03/14/2016   TIBC 298 03/14/2016   IRONPCTSAT 41 (H) 03/14/2016   Lab Results  Component Value Date   FERRITIN 61 03/14/2016     STUDIES: No results found.  ASSESSMENT:   1. Clinical stage IIb ER positive, PR  negative, HER-2 overexpressing invasive lobular carcinoma overlapping sites of the left breast, now pathologic stage Ia (T1c,N0,M0). 2. Clinical stage Ia ER/PR positive, HER-2 negative invasive ductal carcinoma of the upper outer quadrant of right breast, now pathologic stage 0. 3. BRCA 1 and 2 negative.  PLAN:    1. Clinical stage IIb ER positive, PR negative, HER-2 overexpressing invasive lobular carcinoma overlapping sites of the left breast, pathologic stage Ia (T1c,N0,M0): Patient noted to have residual stage IA tumor and her left breast with a complete pathologic response in her right breast. Patient completed 6 cycles of chemotherapy in June 2017 and year-long Herceptin maintenance on July 11, 2016.  Continue letrozole for a total of 5 years completing in March 2023.  Her most recent mammogram on July 16, 2018 was reported as BI-RADS 2.  Repeat in March 2021.  Return to clinic in 6 months for routine evaluation. 2. Clinical stage Ia ER/PR positive, HER-2 negative invasive ductal carcinoma of the upper outer quadrant of right breast, pathologic stage 0: Complete pathologic response. Patient has completed XRT.  Continue letrozole as above.  Repeat mammogram in March 2021. 3. Pulmonary nodule: CT scan results from February 03, 2017 reviewed independently with pulmonary lesion that is unchanged. No intervention is needed.  No further imaging is necessary. 4. Osteoporosis: Patient's most recent bone mineral density on July 16, 2018 revealed continue improvement of her T score to -2.1.  Previously her T score was -2.7 in January 2018. Continue Fosamax, calcium, and vitamin D supplementation.  Repeat in March 2021. 5.  TMJ pain: Possibly secondary to increased anxiety.  Continue evaluation by dentistry.  Patient reports she may be evaluated by ENT in the near future. 6.  Weight gain: Mild, monitor.  I spent a total of 30 minutes face-to-face with the patient of which greater than 50% of the visit was  spent in counseling and coordination of care as detailed above.   Patient expressed understanding and was in agreement with this plan. She also understands that She can call clinic at any time with any questions, concerns, or complaints.    Lloyd Huger, MD 02/13/19 7:11 AM

## 2019-02-12 ENCOUNTER — Inpatient Hospital Stay: Payer: Medicare Other | Attending: Oncology | Admitting: Oncology

## 2019-02-12 ENCOUNTER — Encounter: Payer: Self-pay | Admitting: Oncology

## 2019-02-12 ENCOUNTER — Other Ambulatory Visit: Payer: Self-pay

## 2019-02-12 VITALS — BP 162/106 | HR 85 | Temp 98.2°F | Resp 18 | Wt 173.4 lb

## 2019-02-12 DIAGNOSIS — Z9221 Personal history of antineoplastic chemotherapy: Secondary | ICD-10-CM | POA: Insufficient documentation

## 2019-02-12 DIAGNOSIS — Z803 Family history of malignant neoplasm of breast: Secondary | ICD-10-CM | POA: Insufficient documentation

## 2019-02-12 DIAGNOSIS — Z170 Estrogen receptor positive status [ER+]: Secondary | ICD-10-CM | POA: Diagnosis not present

## 2019-02-12 DIAGNOSIS — Z79899 Other long term (current) drug therapy: Secondary | ICD-10-CM | POA: Diagnosis not present

## 2019-02-12 DIAGNOSIS — Z79811 Long term (current) use of aromatase inhibitors: Secondary | ICD-10-CM | POA: Insufficient documentation

## 2019-02-12 DIAGNOSIS — R6884 Jaw pain: Secondary | ICD-10-CM | POA: Diagnosis not present

## 2019-02-12 DIAGNOSIS — R918 Other nonspecific abnormal finding of lung field: Secondary | ICD-10-CM | POA: Insufficient documentation

## 2019-02-12 DIAGNOSIS — Z85828 Personal history of other malignant neoplasm of skin: Secondary | ICD-10-CM | POA: Diagnosis not present

## 2019-02-12 DIAGNOSIS — M810 Age-related osteoporosis without current pathological fracture: Secondary | ICD-10-CM | POA: Insufficient documentation

## 2019-02-12 DIAGNOSIS — Z808 Family history of malignant neoplasm of other organs or systems: Secondary | ICD-10-CM | POA: Diagnosis not present

## 2019-02-12 DIAGNOSIS — C50411 Malignant neoplasm of upper-outer quadrant of right female breast: Secondary | ICD-10-CM | POA: Insufficient documentation

## 2019-02-12 DIAGNOSIS — C50812 Malignant neoplasm of overlapping sites of left female breast: Secondary | ICD-10-CM | POA: Insufficient documentation

## 2019-02-12 DIAGNOSIS — Z809 Family history of malignant neoplasm, unspecified: Secondary | ICD-10-CM | POA: Insufficient documentation

## 2019-02-12 DIAGNOSIS — Z923 Personal history of irradiation: Secondary | ICD-10-CM | POA: Diagnosis not present

## 2019-02-12 NOTE — Progress Notes (Signed)
States is having some occasional pain in jaw but feeling well today. Having more anxiety related to recent jaw issues. Held letrozole for the month of June but did not notice any changes so restarted again in July.

## 2019-02-18 ENCOUNTER — Other Ambulatory Visit: Payer: Self-pay

## 2019-02-18 ENCOUNTER — Ambulatory Visit: Payer: Medicare Other | Admitting: Surgery

## 2019-02-18 ENCOUNTER — Encounter: Payer: Self-pay | Admitting: Surgery

## 2019-02-18 VITALS — BP 142/89 | HR 90 | Temp 97.3°F | Resp 15 | Ht 66.0 in | Wt 173.8 lb

## 2019-02-18 DIAGNOSIS — C50812 Malignant neoplasm of overlapping sites of left female breast: Secondary | ICD-10-CM | POA: Diagnosis not present

## 2019-02-18 DIAGNOSIS — Z170 Estrogen receptor positive status [ER+]: Secondary | ICD-10-CM | POA: Diagnosis not present

## 2019-02-18 NOTE — Patient Instructions (Addendum)
May shower the day after tomorrow on 02/20/19.  May use ice pack as needed for comfort and Tylenol or Ibuprofen if needed. The surgical glue will start to come off in a week or 2.   Follow up here as needed. Call with any questions or problems.

## 2019-02-18 NOTE — Progress Notes (Signed)
Procedure note 1. Removal of Right port-a-cath  Anesthesia: lidocaine 1% epi 10cc  EBL: minimal  The patient was playing about the procedure in detail. Possible complications and a consent was obtained.  She was prepped and draped in the usual sterile fashion local anesthetic was infiltrated.  15 blade knife used to create an incision and made some bowel scissors used to dissect the pocket and cut the Prolene sutures from the chest wall.  After asking the patient to do a Valsalva I remove the Port-A-Cath without any complications.  Pressure was applied.  The wound was closed in a 2 layer fashion with interrupted 3-0 Vicryl's and 4-0 Monocryl for the skin in a subcuticular fashion.  Dermabond was applied

## 2019-03-16 ENCOUNTER — Other Ambulatory Visit: Payer: Self-pay | Admitting: Oncology

## 2019-05-03 DIAGNOSIS — I10 Essential (primary) hypertension: Secondary | ICD-10-CM | POA: Insufficient documentation

## 2019-05-30 ENCOUNTER — Other Ambulatory Visit: Payer: Self-pay

## 2019-05-30 ENCOUNTER — Encounter: Payer: Self-pay | Admitting: Radiation Oncology

## 2019-05-30 ENCOUNTER — Ambulatory Visit
Admission: RE | Admit: 2019-05-30 | Discharge: 2019-05-30 | Disposition: A | Discharge status: Home / Self Care | Payer: Medicare PPO | Source: Ambulatory Visit | Attending: Radiation Oncology | Admitting: Radiation Oncology

## 2019-05-30 VITALS — BP 138/98 | HR 93 | Resp 16 | Wt 174.3 lb

## 2019-05-30 DIAGNOSIS — Z79811 Long term (current) use of aromatase inhibitors: Secondary | ICD-10-CM | POA: Insufficient documentation

## 2019-05-30 DIAGNOSIS — C50812 Malignant neoplasm of overlapping sites of left female breast: Secondary | ICD-10-CM | POA: Insufficient documentation

## 2019-05-30 DIAGNOSIS — C50411 Malignant neoplasm of upper-outer quadrant of right female breast: Secondary | ICD-10-CM | POA: Insufficient documentation

## 2019-05-30 DIAGNOSIS — Z170 Estrogen receptor positive status [ER+]: Secondary | ICD-10-CM | POA: Diagnosis not present

## 2019-05-30 DIAGNOSIS — Z923 Personal history of irradiation: Secondary | ICD-10-CM | POA: Insufficient documentation

## 2019-05-30 NOTE — Progress Notes (Signed)
Radiation Oncology Follow up Note  Name: Stephanie Crosby   Date:   05/30/2019 MRN:  [id number] DOB: 30-Jun-1935    This 84 y.o. female presents to the clinic today for 3-year follow-up status post bilateral breast radiation lobular invasive carcinoma stage Ia of her right breast and ductal carcinoma in situ ER/PR positive the left breast.  REFERRING PROVIDER: Maryland Pink, MD  HPI: Patient is an 84 year old female now seen at 3 years having completed bilateral breast radiation for stage Ia invasive lobular carcinoma the right breast and ductal carcinoma ER/PR positive the left breast.  Seen today in routine follow-up she is doing well.  She specifically denies breast tenderness cough or bone pain.  She is a slight reddish hue to her breast have assured her that is normal..  She is currently on Femara tolerating it well without side effect.  She had mammograms back in March which I have reviewed were BI-RADS 2 benign.  COMPLICATIONS OF TREATMENT: none  FOLLOW UP COMPLIANCE: keeps appointments   PHYSICAL EXAM:  BP (!) 138/98 (BP Location: Left Arm, Patient Position: Sitting)   Pulse 93   Resp 16   Wt 174 lb 4.8 oz (79.1 kg)   BMI 28.13 kg/m  Lungs are clear to A&P cardiac examination essentially unremarkable with regular rate and rhythm. No dominant mass or nodularity is noted in either breast in 2 positions examined. Incision is well-healed. No axillary or supraclavicular adenopathy is appreciated. Cosmetic result is excellent.  Well-developed well-nourished patient in NAD. HEENT reveals PERLA, EOMI, discs not visualized.  Oral cavity is clear. No oral mucosal lesions are identified. Neck is clear without evidence of cervical or supraclavicular adenopathy. Lungs are clear to A&P. Cardiac examination is essentially unremarkable with regular rate and rhythm without murmur rub or thrill. Abdomen is benign with no organomegaly or masses noted. Motor sensory and DTR levels are equal and symmetric  in the upper and lower extremities. Cranial nerves II through XII are grossly intact. Proprioception is intact. No peripheral adenopathy or edema is identified. No motor or sensory levels are noted. Crude visual fields are within normal range.  RADIOLOGY RESULTS: Mammograms reviewed compatible with above-stated findings  PLAN: Present time patient is doing well with no evidence of disease now 3 years out we will see her back in 1 year for follow-up.  Patient continues on Femara without side effect.  She is already scheduled for follow-up mammograms in March.  Patient knows to call with any concerns.  I would like to take this opportunity to thank you for allowing me to participate in the care of your patient.Noreene Filbert, MD

## 2019-07-16 ENCOUNTER — Other Ambulatory Visit: Payer: Self-pay | Admitting: Oncology

## 2019-07-16 DIAGNOSIS — Z170 Estrogen receptor positive status [ER+]: Secondary | ICD-10-CM

## 2019-07-16 DIAGNOSIS — C50411 Malignant neoplasm of upper-outer quadrant of right female breast: Secondary | ICD-10-CM

## 2019-07-17 ENCOUNTER — Ambulatory Visit
Admission: RE | Admit: 2019-07-17 | Discharge: 2019-07-17 | Disposition: A | Discharge status: Home / Self Care | Payer: Medicare PPO | Source: Ambulatory Visit | Attending: Oncology | Admitting: Oncology

## 2019-07-17 ENCOUNTER — Other Ambulatory Visit: Payer: Self-pay | Admitting: Specialist

## 2019-07-17 DIAGNOSIS — Z170 Estrogen receptor positive status [ER+]: Secondary | ICD-10-CM | POA: Insufficient documentation

## 2019-07-17 DIAGNOSIS — Z79811 Long term (current) use of aromatase inhibitors: Secondary | ICD-10-CM | POA: Insufficient documentation

## 2019-07-17 DIAGNOSIS — M8589 Other specified disorders of bone density and structure, multiple sites: Secondary | ICD-10-CM | POA: Diagnosis not present

## 2019-07-17 DIAGNOSIS — J849 Interstitial pulmonary disease, unspecified: Secondary | ICD-10-CM

## 2019-07-17 DIAGNOSIS — C50812 Malignant neoplasm of overlapping sites of left female breast: Secondary | ICD-10-CM | POA: Diagnosis not present

## 2019-07-17 DIAGNOSIS — R0602 Shortness of breath: Secondary | ICD-10-CM

## 2019-07-30 ENCOUNTER — Other Ambulatory Visit: Payer: Self-pay

## 2019-07-30 ENCOUNTER — Ambulatory Visit
Admission: RE | Admit: 2019-07-30 | Discharge: 2019-07-30 | Disposition: A | Discharge status: Home / Self Care | Payer: Medicare PPO | Source: Ambulatory Visit | Attending: Specialist | Admitting: Specialist

## 2019-07-30 DIAGNOSIS — J849 Interstitial pulmonary disease, unspecified: Secondary | ICD-10-CM | POA: Diagnosis not present

## 2019-07-30 DIAGNOSIS — R0602 Shortness of breath: Secondary | ICD-10-CM | POA: Diagnosis present

## 2019-08-09 NOTE — Progress Notes (Signed)
_ Stephanie Crosby  Telephone:([phone] [phone]  ID: Stephanie Crosby OB: 1936/02/22  MR#: [id number]  VOZ#:[id number]  [name], MD as PCP - General (Family Medicine) [name], MD as Consulting Physician (General Surgery)   CHIEF COMPLAINT:   1. Clinical stage IIb ER positive, PR negative, HER-2 overexpressing invasive lobular carcinoma overlapping sites of the left breast, now pathologic stage Ia (T1c,N0,M0). 2. Clinical stage Ia ER/PR positive, HER-2 negative invasive ductal carcinoma of the upper outer quadrant of right breast, now pathologic stage 0.  INTERVAL HISTORY: Patient returns to clinic today for routine 69-monthevaluation.  She recently had a CT scan completed by pulmonology for persistent shortness of breath, but otherwise has felt well.  She continues to have occasional joint pain and swelling. Her peripheral neuropathy is chronic and unchanged and does not affect her day-to-day activity. She has no other neurologic complaints. She denies any recent fevers or illnesses.  She denies any chest pain, shortness of breath, cough, or hemoptysis.  She denies nausea, vomiting, diarrhea, or constipation. She has no urinary complaints.  Patient offers no further specific complaints today.  REVIEW OF SYSTEMS:   Review of Systems  Constitutional: Negative.  Negative for fever, malaise/fatigue and weight loss.  HENT: Negative for sore throat.   Eyes: Negative for discharge and redness.  Respiratory: Positive for shortness of breath. Negative for cough and hemoptysis.   Cardiovascular: Negative.  Negative for chest pain and leg swelling.  Gastrointestinal: Negative for abdominal pain, diarrhea, nausea and vomiting.  Genitourinary: Negative.  Negative for dysuria.  Musculoskeletal: Positive for joint pain.  Skin: Negative.  Negative for rash.  Neurological: Positive for tingling and sensory change (Mild neuropathy ). Negative  for weakness and headaches.  Psychiatric/Behavioral: Negative.  The patient is not nervous/anxious and does not have insomnia.    As per HPI. Otherwise, a complete review of systems is negative.   PAST MEDICAL HISTORY: Past Medical History:  Diagnosis Date  . Anemia   . Back injury   . [place]   Chemo tx's.  . Breast [place]  . Breast cancer in situ 05/2015   Left  . Edema of leg   . GERD (gastroesophageal reflux disease)   . Gonalgia   . Hiatal hernia   . Personal history of chemotherapy   . Personal history of radiation therapy   . Seasonal allergic rhinitis   . Sleep apnea   . Squamous cell skin cancer 2011   resected from left side of nose and Right axilla area.   . Tearing eyes    SINCE TAKING CHEMO    PAST SURGICAL HISTORY: Past Surgical History:  Procedure Laterality Date  . APPENDECTOMY    . AXILLARY SENTINEL NODE BIOPSY Bilateral 12/10/2015   Procedure: BILATERAL SENTINEL NODE BIOPSY, SENTINNEL NODE INJ.;  Surgeon: CClayburn Pert MD;  Location: [facility] ORS;  Service: General;  Laterality: Bilateral;  . BREAST BIOPSY Bilateral 11/17/2015   Procedure: BREAST BIOPSY WITH NEEDLE LOCALIZATION;  Surgeon: CClayburn Pert MD;  Location: [facility] ORS;  Service: General;  Laterality: Bilateral;  . BREAST LUMPECTOMY Bilateral 05/2015   01/17 chemo before radiation after  . CATARACT EXTRACTION, BILATERAL Bilateral 2012  . COLONOSCOPY    . PORTACATH PLACEMENT Right 06/10/2015   Procedure: INSERTION PORT-A-CATH;  Surgeon: CClayburn Pert MD;  Location: [facility] ORS;  Service: General;  Laterality: Right;    FAMILY HISTORY: Patient reports a maternal aunt with breast cancer  as well as a female cousin with breast cancer.  Family History  Problem Relation Age of Onset  . Dementia Mother   . Hyperlipidemia Mother   . Hypertension Mother   . Congestive Heart Failure Mother   . Heart disease Mother   . Aneurysm Father        abdominal  .  Fibromyalgia Sister   . Heart disease Sister   . Cancer Maternal Uncle        brain  . Heart disease Maternal Uncle   . Pancreatic cancer Maternal Grandmother   . Cancer Cousin        female cousin with breast cancer and brain cancer  . Ovarian cancer Maternal Aunt   . Colon cancer Maternal Aunt   . Breast cancer Neg Hx        ADVANCED DIRECTIVES:    HEALTH MAINTENANCE: Social History   Tobacco Use  . Smoking status: Never Smoker  . Smokeless tobacco: Never Used  Substance Use Topics  . Alcohol use: Not Currently    Alcohol/week: 0.0 standard drinks    Comment: 3 glasses wine/week  . Drug use: No      Allergies  Allergen Reactions  . Penicillins Rash    Has patient had a PCN reaction causing immediate rash, facial/tongue/throat swelling, SOB or lightheadedness with hypotension: unsure Has patient had a PCN reaction causing severe rash involving mucus membranes or skin necrosis: no Has patient had a PCN reaction that required hospitalization no Has patient had a PCN reaction occurring within the last 10 years: no If all of the above answers are "NO", then may proceed with Cephalosporin use.    Current Outpatient Medications  Medication Sig Dispense Refill  . acetaminophen (TYLENOL) 500 MG tablet Take 500 mg by mouth every 6 (six) hours as needed for mild pain.    Marland Kitchen alendronate (FOSAMAX) 70 MG tablet TAKE 1 TABLET EVERY 7 DAYS WITH A FULL GLASS OF WATER ON AN EMPTY STOMACH DO NOT LIE DOWN FOR AT LEAST 30 MIN 4 tablet 11  . azelastine (ASTELIN) 0.1 % nasal spray     . benzonatate (TESSALON) 200 MG capsule     . calcium-vitamin D (CALCIUM 500/D) 500-200 MG-UNIT tablet Take 2 tablets by mouth daily with breakfast.     . letrozole (FEMARA) 2.5 MG tablet TAKE 1 TABLET BY MOUTH DAILY 90 tablet 3  . losartan (COZAAR) 50 MG tablet Take 50 mg by mouth daily.    . Multiple Vitamins-Minerals (CENTRUM SILVER PO) Take 1 tablet by mouth daily. Reported on 06/15/2015    . omeprazole  (PRILOSEC) 20 MG capsule Take 1 capsule by mouth every morning. Reported on 06/15/2015    . valACYclovir (VALTREX) 500 MG tablet Take 500 mg by mouth daily as needed (break outs). Reported on 06/15/2015     No current facility-administered medications for this visit.  1 1  OBJECTIVE: Vitals:   08/12/19 1044  BP: 137/89  Pulse: 89  Resp: 18  Temp: (!) 97.2 F (36.2 C)  SpO2: 96%     Body mass index is 27.78 kg/m.    ECOG FS:0 - Asymptomatic  General: Well-developed, well-nourished, no acute distress. Eyes: Pink conjunctiva, anicteric sclera. HEENT: Normocephalic, moist mucous membranes. Breast: Patient declined breast exam today. Lungs: Clear to auscultation bilaterally. Heart: Regular rate and rhythm. No rubs, murmurs, or gallops. Abdomen: Soft, nontender, nondistended. No organomegaly noted, normoactive bowel sounds. Musculoskeletal: No edema, cyanosis, or clubbing. Neuro: Alert, answering all questions appropriately. Cranial  nerves grossly intact. Skin: No rashes or petechiae noted. Psych: Normal affect.  LAB RESULTS:  Lab Results  Component Value Date   NA 136 01/09/2017   K 3.9 01/09/2017   CL 105 01/09/2017   CO2 24 01/09/2017   GLUCOSE 101 (H) 01/09/2017   BUN 20 01/09/2017   CREATININE 0.66 01/09/2017   CALCIUM 9.5 01/09/2017   PROT 6.9 01/09/2017   ALBUMIN 3.9 01/09/2017   AST 22 01/09/2017   ALT 18 01/09/2017   ALKPHOS 42 01/09/2017   BILITOT 1.6 (H) 01/09/2017   GFRNONAA >60 01/09/2017   GFRAA >60 01/09/2017    Lab Results  Component Value Date   WBC 4.2 01/17/2018   NEUTROABS 2.1 01/17/2018   HGB 14.2 01/17/2018   HCT 41.2 01/17/2018   MCV 92.6 01/17/2018   PLT 185 01/17/2018   Lab Results  Component Value Date   IRON 122 03/14/2016   TIBC 298 03/14/2016   IRONPCTSAT 41 (H) 03/14/2016   Lab Results  Component Value Date   FERRITIN 61 03/14/2016     STUDIES: CT Chest High Resolution  Result Date: 07/30/2019 CLINICAL DATA:   Interstitial lung disease, shortness of breath on exertion, history of bilateral breast cancer, s/p chemo radiation EXAM: CT CHEST WITHOUT CONTRAST TECHNIQUE: Multidetector CT imaging of the chest was performed following the standard protocol without intravenous contrast. High resolution imaging of the lungs, as well as inspiratory and expiratory imaging, was performed. COMPARISON:  02/03/2017, 05/20/2015 FINDINGS: Cardiovascular: Aortic atherosclerosis. Normal heart size. Three-vessel coronary artery calcifications. No pericardial effusion. Mediastinum/Nodes: No enlarged mediastinal, hilar, or axillary lymph nodes. Large hiatal hernia with nearly complete intrathoracic position of the stomach. Thyroid gland, trachea, and esophagus demonstrate no significant findings. Lungs/Pleura: There is minimal peripheral ground-glass opacity in the bilateral lung bases, similar in appearance to prior examinations dating back to 2017 and with some non dependent change, particularly in the lingula (series 5, image 151, 193). Chronic compressive atelectasis and/or scarring of the medial lower lobes related to adjacent hernia. No significant air trapping on expiratory phase imaging. Stable, benign small pulmonary nodules, for example a 4 mm nodule of the left lower lobe (series 5, image 184). No pleural effusion or pneumothorax. Upper Abdomen: No acute abnormality. Musculoskeletal: No chest wall mass or suspicious bone lesions identified. IMPRESSION: 1. Minimal peripheral ground-glass opacity in the bilateral lung bases, similar in appearance to prior examinations dating back to 2017 and with some non dependent change, particularly in the lingula. Generally favor bland sequelae of infection or inflammation, and in the lingula, minimal subpleural radiation change given history of breast malignancy. If characterized by ATS pulmonary fibrosis criteria, these findings are in an early "indeterminate for UIP" pattern, and ongoing annual  ILD protocol CT follow-up can be considered to assess for stability of fibrotic findings and pattern depending upon degree of clinical concern for fibrotic interstitial lung disease. Findings are indeterminate for UIP per consensus guidelines: Diagnosis of Idiopathic Pulmonary Fibrosis: An Official ATS/ERS/JRS/ALAT Clinical Practice Guideline. [place], Iss 5, [phone], Dec 31 2016. 2. Large hiatal hernia with nearly complete intrathoracic position of the stomach. 3. Coronary artery disease. Aortic Atherosclerosis (ICD10-I70.0). Electronically Signed   By: Eddie Candle M.D.   On: 07/30/2019 11:25   DG Bone Density  Result Date: 07/17/2019 EXAM: DUAL X-RAY ABSORPTIOMETRY (DXA) FOR BONE MINERAL DENSITY IMPRESSION: Your patient Jennelle Pinkstaff completed a BMD test on 07/17/2019 using the [place] (software version: 14.10) manufactured  by [employer]. The following summarizes the results of our evaluation. Technologist: SCE PATIENT BIOGRAPHICAL: Name: Trudee, Chirino Patient ID: [id number] Birth Date: 08-04-1935 Height: 65.5 in. Gender: Female Exam Date: 07/17/2019 Weight: 173.2 lbs. Indications: Advanced Age, Breast CA, Caucasian, Height Loss, High Risk Meds, History of Fracture (Adult), History of Osteoporosis, Parent Hip Fracture, Postmenopausal, Previous Chemo and Radiation Fractures: Right foot, coccyx Treatments: calcium w/ vit D, Fosamax, Letrozole, Multi-Vitamin with calcium, Omeprazole DENSITOMETRY RESULTS: Site      Region      Measured Date Measured Age WHO Classification Young Adult T-score BMD         %Change vs. Previous Significant Change (*) AP Spine L1-L3 07/17/2019 82.1 Osteopenia -2.2 0.914 g/cm2 1.4% - AP Spine L1-L3 07/16/2018 81.1 Osteopenia -2.3 0.901 g/cm2 2.0% - AP Spine L1-L3 07/13/2017 80.1 Osteopenia -2.4 0.883 g/cm2 6.4% Yes AP Spine L1-L3 06/01/2016 79.0 Osteoporosis -2.9 0.830 g/cm2 - - DualFemur Total Right 07/17/2019 82.1 Osteopenia  -1.9 0.766 g/cm2 2.7% - DualFemur Total Right 07/16/2018 81.1 Osteopenia -2.1 0.746 g/cm2 0.5% - DualFemur Total Right 07/13/2017 80.1 Osteopenia -2.1 0.742 g/cm2 9.3% Yes DualFemur Total Right 06/01/2016 79.0 Osteoporosis -2.6 0.679 g/cm2 - - DualFemur Total Mean 07/17/2019 82.1 Osteopenia -1.8 0.782 g/cm2 0.8% - DualFemur Total Mean 07/16/2018 81.1 Osteopenia -1.8 0.776 g/cm2 1.3% - DualFemur Total Mean 07/13/2017 80.1 Osteopenia -1.9 0.766 g/cm2 6.2% Yes DualFemur Total Mean 06/01/2016 79.0 Osteopenia -2.3 0.721 g/cm2 - - ASSESSMENT: The BMD measured at AP Spine L1-L3 is 0.914 g/cm2 with a T-score of -2.2. This patient is considered osteopenic according to [facility]) criteria. The scan quality is good. Compared with prior study, there has been no significant change in the spine. Compared with prior study, there has been no significant change in the total hip. L4 was excluded due to degenerative changes. Patient is not a candidate for FRAX due to Fosamax. World Pharmacologist [facility]) criteria for post-menopausal, Caucasian Women: Normal:                   T-score at or above -1 SD Osteopenia/low bone mass: T-score between -1 and -2.5 SD Osteoporosis:             T-score at or below -2.5 SD RECOMMENDATIONS: 1. All patients should optimize calcium and vitamin D intake. 2. Consider FDA-approved medical therapies in postmenopausal women and men aged 11 years and older, based on the following: a. A hip or vertebral(clinical or morphometric) fracture b. T-score < -2.5 at the femoral neck or spine after appropriate evaluation to exclude secondary causes c. Low bone mass (T-score between -1.0 and -2.5 at the femoral neck or spine) and a 10-year probability of a hip fracture > 3% or a 10-year probability of a major osteoporosis-related fracture > 20% based on the [place]-adapted WHO algorithm 3. Clinician judgment and/or patient preferences may indicate treatment for people with 10-year fracture  probabilities above or below these levels FOLLOW-UP: People with diagnosed cases of osteoporosis or at high risk for fracture should have regular bone mineral density tests. For patients eligible for Medicare, routine testing is allowed once every 2 years. The testing frequency can be increased to one year for patients who have rapidly progressing disease, those who are receiving or discontinuing medical therapy to restore bone mass, or have additional risk factors. I have reviewed this report, and agree with the above findings. [facility] Electronically Signed   By: Lowella Grip III M.D.   On:  07/17/2019 10:42   MM DIAG BREAST TOMO BILATERAL  Result Date: 07/17/2019 CLINICAL DATA:  84 year old female with history of bilateral breast cancer in 2017 status post lumpectomy and radiation. EXAM: DIGITAL DIAGNOSTIC BILATERAL MAMMOGRAM WITH CAD AND TOMO COMPARISON:  Previous exam(s). ACR Breast Density Category b: There are scattered areas of fibroglandular density. FINDINGS: Right breast: There are stable postsurgical changes from prior lumpectomy in the superior right breast. No suspicious mass, distortion, or microcalcifications are identified to suggest presence of malignancy. Left breast: There are stable postsurgical changes from prior lumpectomy in the superior aspect of the left breast and expected changes of fat necrosis. No suspicious mass, distortion, or microcalcifications are identified to suggest presence of malignancy. Mammographic images were processed with CAD. IMPRESSION: Stable postsurgical changes bilaterally. No mammographic evidence of malignancy. RECOMMENDATION: Diagnostic bilateral mammogram in 1 year. I have discussed the findings and recommendations with the patient. If applicable, a reminder letter will be sent to the patient regarding the next appointment. BI-RADS CATEGORY  2: Benign. Electronically Signed   By: Audie Pinto M.D.   On: 07/17/2019 10:18     ASSESSMENT:   1. Clinical stage IIb ER positive, PR negative, HER-2 overexpressing invasive lobular carcinoma overlapping sites of the left breast, now pathologic stage Ia (T1c,N0,M0). 2. Clinical stage Ia ER/PR positive, HER-2 negative invasive ductal carcinoma of the upper outer quadrant of right breast, now pathologic stage 0. 3. BRCA 1 and 2 negative.  PLAN:    1. Clinical stage IIb ER positive, PR negative, HER-2 overexpressing invasive lobular carcinoma overlapping sites of the left breast, pathologic stage Ia (T1c,N0,M0): Patient noted to have residual stage IA tumor and her left breast with a complete pathologic response in her right breast. Patient completed 6 cycles of chemotherapy in June 2017 and year-long Herceptin maintenance on July 11, 2016.  Continue letrozole for a total of 5 years completing in March 2023.  Her most recent mammogram on July 17, 2019 was reported as BI-RADS 2.  Repeat in March 2022.  Return to clinic in 6 months for routine evaluation.   2. Clinical stage Ia ER/PR positive, HER-2 negative invasive ductal carcinoma of the upper outer quadrant of right breast, pathologic stage 0: Complete pathologic response. Patient has completed XRT.  Letrozole and mammogram as above.   3.  Shortness of breath/history of pulmonary nodule: CT scan results from July 30, 2019 reviewed independently and reported as above with no mention of pulmonary nodule.  Continue follow-up with pulmonology as indicated.   4. Osteoporosis: Patient's most recent bone mineral density on July 17, 2019 reported T score of -2.2 which is essentially unchanged from 1 year prior.  Continue Fosamax, calcium, and vitamin D supplementation.  Repeat in March 2022. 5.  TMJ pain: Patient does not complain of this today.  Patient expressed understanding and was in agreement with this plan. She also understands that She can call clinic at any time with any questions, concerns, or complaints.    Lloyd Huger, MD 08/12/19 11:28 AM

## 2019-08-12 ENCOUNTER — Inpatient Hospital Stay: Payer: Medicare PPO | Attending: Oncology | Admitting: Oncology

## 2019-08-12 ENCOUNTER — Encounter: Payer: Self-pay | Admitting: Oncology

## 2019-08-12 ENCOUNTER — Other Ambulatory Visit: Payer: Self-pay

## 2019-08-12 VITALS — BP 137/89 | HR 89 | Temp 97.2°F | Resp 18 | Wt 172.1 lb

## 2019-08-12 DIAGNOSIS — G4730 Sleep apnea, unspecified: Secondary | ICD-10-CM | POA: Diagnosis not present

## 2019-08-12 DIAGNOSIS — Z79811 Long term (current) use of aromatase inhibitors: Secondary | ICD-10-CM | POA: Diagnosis not present

## 2019-08-12 DIAGNOSIS — Z8041 Family history of malignant neoplasm of ovary: Secondary | ICD-10-CM | POA: Insufficient documentation

## 2019-08-12 DIAGNOSIS — Z923 Personal history of irradiation: Secondary | ICD-10-CM | POA: Diagnosis not present

## 2019-08-12 DIAGNOSIS — Z8349 Family history of other endocrine, nutritional and metabolic diseases: Secondary | ICD-10-CM | POA: Insufficient documentation

## 2019-08-12 DIAGNOSIS — Z9221 Personal history of antineoplastic chemotherapy: Secondary | ICD-10-CM | POA: Insufficient documentation

## 2019-08-12 DIAGNOSIS — C50411 Malignant neoplasm of upper-outer quadrant of right female breast: Secondary | ICD-10-CM

## 2019-08-12 DIAGNOSIS — Z803 Family history of malignant neoplasm of breast: Secondary | ICD-10-CM | POA: Diagnosis not present

## 2019-08-12 DIAGNOSIS — K219 Gastro-esophageal reflux disease without esophagitis: Secondary | ICD-10-CM | POA: Diagnosis not present

## 2019-08-12 DIAGNOSIS — Z800 Family history of malignant neoplasm of digestive organs: Secondary | ICD-10-CM | POA: Diagnosis not present

## 2019-08-12 DIAGNOSIS — Z85828 Personal history of other malignant neoplasm of skin: Secondary | ICD-10-CM | POA: Diagnosis not present

## 2019-08-12 DIAGNOSIS — G629 Polyneuropathy, unspecified: Secondary | ICD-10-CM | POA: Insufficient documentation

## 2019-08-12 DIAGNOSIS — C50812 Malignant neoplasm of overlapping sites of left female breast: Secondary | ICD-10-CM

## 2019-08-12 DIAGNOSIS — Z170 Estrogen receptor positive status [ER+]: Secondary | ICD-10-CM

## 2019-08-12 DIAGNOSIS — Z79899 Other long term (current) drug therapy: Secondary | ICD-10-CM | POA: Diagnosis not present

## 2019-08-12 DIAGNOSIS — Z8249 Family history of ischemic heart disease and other diseases of the circulatory system: Secondary | ICD-10-CM | POA: Insufficient documentation

## 2019-08-12 NOTE — Progress Notes (Signed)
Patient here as a followup. Denies any pain or concerns today.

## 2019-08-15 ENCOUNTER — Ambulatory Visit: Payer: Medicare Other | Admitting: Oncology

## 2020-01-07 ENCOUNTER — Other Ambulatory Visit: Payer: Self-pay

## 2020-01-07 ENCOUNTER — Encounter (INDEPENDENT_AMBULATORY_CARE_PROVIDER_SITE_OTHER): Payer: Self-pay | Admitting: Vascular Surgery

## 2020-01-07 ENCOUNTER — Ambulatory Visit (INDEPENDENT_AMBULATORY_CARE_PROVIDER_SITE_OTHER): Payer: Medicare PPO | Admitting: Vascular Surgery

## 2020-01-07 VITALS — BP 144/86 | HR 79 | Ht 66.0 in | Wt 169.0 lb

## 2020-01-07 DIAGNOSIS — M7989 Other specified soft tissue disorders: Secondary | ICD-10-CM | POA: Diagnosis not present

## 2020-01-07 DIAGNOSIS — M47816 Spondylosis without myelopathy or radiculopathy, lumbar region: Secondary | ICD-10-CM

## 2020-01-07 DIAGNOSIS — I10 Essential (primary) hypertension: Secondary | ICD-10-CM | POA: Diagnosis not present

## 2020-01-07 NOTE — Assessment & Plan Note (Signed)
Back issues could worsen lower extremity symptoms.

## 2020-01-07 NOTE — Patient Instructions (Signed)
Edema  Edema is when you have too much fluid in your body or under your skin. Edema may make your legs, feet, and ankles swell up. Swelling is also common in looser tissues, like around your eyes. This is a common condition. It gets more common as you get older. There are many possible causes of edema. Eating too much salt (sodium) and being on your feet or sitting for a long time can cause edema in your legs, feet, and ankles. Hot weather may make edema worse. Edema is usually painless. Your skin may look swollen or shiny. Follow these instructions at home:  Keep the swollen body part raised (elevated) above the level of your heart when you are sitting or lying down.  Do not sit still or stand for a long time.  Do not wear tight clothes. Do not wear garters on your upper legs.  Exercise your legs. This can help the swelling go down.  Wear elastic bandages or support stockings as told by your doctor.  Eat a low-salt (low-sodium) diet to reduce fluid as told by your doctor.  Depending on the cause of your swelling, you may need to limit how much fluid you drink (fluid restriction).  Take over-the-counter and prescription medicines only as told by your doctor. Contact a doctor if:  Treatment is not working.  You have heart, liver, or kidney disease and have symptoms of edema.  You have sudden and unexplained weight gain. Get help right away if:  You have shortness of breath or chest pain.  You cannot breathe when you lie down.  You have pain, redness, or warmth in the swollen areas.  You have heart, liver, or kidney disease and get edema all of a sudden.  You have a fever and your symptoms get worse all of a sudden. Summary  Edema is when you have too much fluid in your body or under your skin.  Edema may make your legs, feet, and ankles swell up. Swelling is also common in looser tissues, like around your eyes.  Raise (elevate) the swollen body part above the level of your  heart when you are sitting or lying down.  Follow your doctor's instructions about diet and how much fluid you can drink (fluid restriction). This information is not intended to replace advice given to you by your health care provider. Make sure you discuss any questions you have with your health care provider. Document Revised: 04/21/2017 Document Reviewed: 05/06/2016 Elsevier Patient Education  2020 [employer]  

## 2020-01-07 NOTE — Progress Notes (Signed)
Patient ID: Stephanie Crosby, female   DOB: 1935-05-27, 84 y.o.   MRN: [id number]  Chief Complaint  Patient presents with  . Follow-up    LE Edema    HPI Stephanie Crosby is a 84 y.o. female.  I am asked to see the patient by Dr. Kary Kos for evaluation of lower extremity swelling.  This has been a chronic problem for her for quite some time.  There is no clear cause or inciting event that started the symptoms.  It was most severe this summer although the last few weeks it has gotten a little bit better.  The left lower extremity is the more severely affected of the 2 legs.  She does get swelling in the right leg some to the.  No previous history of DVT or superficial thrombophlebitis to her knowledge.  She has had chemotherapy for breast cancer previously.  No fevers or chills.  No ulceration or infection.  Elevating the leg seems to help some.  Standing or sitting for long periods of time seems to make the swelling worse   Past Medical History:  Diagnosis Date  . Anemia   . Back injury   . Bilateral breast cancer ([place]) 05/2015   Chemo tx's.  . Breast cancer ([place]) 2017  . Breast cancer in situ 05/2015   Left  . Edema of leg   . GERD (gastroesophageal reflux disease)   . Gonalgia   . Hiatal hernia   . Personal history of chemotherapy   . Personal history of radiation therapy   . Seasonal allergic rhinitis   . Sleep apnea   . Squamous cell skin cancer 2011   resected from left side of nose and Right axilla area.   . Tearing eyes    SINCE TAKING CHEMO    Past Surgical History:  Procedure Laterality Date  . APPENDECTOMY    . AXILLARY SENTINEL NODE BIOPSY Bilateral 12/10/2015   Procedure: BILATERAL SENTINEL NODE BIOPSY, SENTINNEL NODE INJ.;  Surgeon: [name], MD;  Location: [facility] ORS;  Service: General;  Laterality: Bilateral;  . BREAST BIOPSY Bilateral 11/17/2015   Procedure: BREAST BIOPSY WITH NEEDLE LOCALIZATION;  Surgeon: [name], MD;  Location: [facility] ORS;   Service: General;  Laterality: Bilateral;  . BREAST LUMPECTOMY Bilateral 05/2015   01/17 chemo before radiation after  . CATARACT EXTRACTION, BILATERAL Bilateral 2012  . COLONOSCOPY    . PORTACATH PLACEMENT Right 06/10/2015   Procedure: INSERTION PORT-A-CATH;  Surgeon: [name], MD;  Location: [facility] ORS;  Service: General;  Laterality: Right;     Family History  Problem Relation Age of Onset  . Dementia Mother   . Hyperlipidemia Mother   . Hypertension Mother   . Congestive Heart Failure Mother   . Heart disease Mother   . Aneurysm Father        abdominal  . Fibromyalgia Sister   . Heart disease Sister   . Cancer Maternal Uncle        brain  . Heart disease Maternal Uncle   . Pancreatic cancer Maternal Grandmother   . Cancer Cousin        female cousin with breast cancer and brain cancer  . Ovarian cancer Maternal Aunt   . Colon cancer Maternal Aunt   . Breast cancer Neg Hx      Social History   Tobacco Use  . Smoking status: Never Smoker  . Smokeless tobacco: Never Used  Vaping Use  . Vaping Use: Never used  Substance Use Topics  . Alcohol use: Not Currently    Alcohol/week: 0.0 standard drinks    Comment: 3 glasses wine/week  . Drug use: No     Allergies  Allergen Reactions  . Penicillins Rash    Has patient had a PCN reaction causing immediate rash, facial/tongue/throat swelling, SOB or lightheadedness with hypotension: unsure Has patient had a PCN reaction causing severe rash involving mucus membranes or skin necrosis: no Has patient had a PCN reaction that required hospitalization no Has patient had a PCN reaction occurring within the last 10 years: no If all of the above answers are "NO", then may proceed with Cephalosporin use.    Current Outpatient Medications  Medication Sig Dispense Refill  . acetaminophen (TYLENOL) 500 MG tablet Take 500 mg by mouth every 6 (six) hours as needed for mild pain.    Marland Kitchen alendronate (FOSAMAX) 70 MG tablet TAKE 1  TABLET EVERY 7 DAYS WITH A FULL GLASS OF WATER ON AN EMPTY STOMACH DO NOT LIE DOWN FOR AT LEAST 30 MIN 4 tablet 11  . azelastine (ASTELIN) 0.1 % nasal spray     . benzonatate (TESSALON) 200 MG capsule     . calcium-vitamin D (CALCIUM 500/D) 500-200 MG-UNIT tablet Take 2 tablets by mouth daily with breakfast.     . letrozole (FEMARA) 2.5 MG tablet TAKE 1 TABLET BY MOUTH DAILY 90 tablet 3  . losartan (COZAAR) 50 MG tablet Take 50 mg by mouth daily.    . Multiple Vitamins-Minerals (CENTRUM SILVER PO) Take 1 tablet by mouth daily. Reported on 06/15/2015    . omeprazole (PRILOSEC) 20 MG capsule Take 1 capsule by mouth every morning. Reported on 06/15/2015    . valACYclovir (VALTREX) 500 MG tablet Take 500 mg by mouth daily as needed (break outs). Reported on 06/15/2015     No current facility-administered medications for this visit.      REVIEW OF SYSTEMS (Negative unless checked)  Constitutional: [] Weight loss  [] Fever  [] Chills Cardiac: [] Chest pain   [] Chest pressure   [] Palpitations   [] Shortness of breath when laying flat   [] Shortness of breath at rest   [] Shortness of breath with exertion. Vascular:  [] Pain in legs with walking   [] Pain in legs at rest   [] Pain in legs when laying flat   [] Claudication   [] Pain in feet when walking  [] Pain in feet at rest  [] Pain in feet when laying flat   [] History of DVT   [] Phlebitis   [x] Swelling in legs   [] Varicose veins   [] Non-healing ulcers Pulmonary:   [] Uses home oxygen   [] Productive cough   [] Hemoptysis   [] Wheeze  [] COPD   [] Asthma Neurologic:  [] Dizziness  [] Blackouts   [] Seizures   [] History of stroke   [] History of TIA  [] Aphasia   [] Temporary blindness   [] Dysphagia   [] Weakness or numbness in arms   [x] Weakness or numbness in legs Musculoskeletal:  [x] Arthritis   [] Joint swelling   [x] Joint pain   [] Low back pain Hematologic:  [] Easy bruising  [] Easy bleeding   [] Hypercoagulable state   [] Anemic  [] Hepatitis Gastrointestinal:  [] Blood in  stool   [] Vomiting blood  [] Gastroesophageal reflux/heartburn   [] Abdominal pain Genitourinary:  [] Chronic kidney disease   [] Difficult urination  [] Frequent urination  [] Burning with urination   [] Hematuria Skin:  [] Rashes   [] Ulcers   [] Wounds Psychological:  [] History of anxiety   []  History of major depression.    Physical Exam BP (!) 144/86  Pulse 79   Ht 5\' 6"  (1.676 m)   Wt 169 lb (76.7 kg)   BMI 27.28 kg/m  Gen:  WD/WN, NAD.  Appears younger than stated age Head: [place]/AT, No temporalis wasting.  Ear/Nose/Throat: Hearing grossly intact, nares w/o erythema or drainage, oropharynx w/o Erythema/Exudate Eyes: Conjunctiva clear, sclera non-icteric  Neck: trachea midline.  No JVD.  Pulmonary:  Good air movement, respirations not labored, no use of accessory muscles  Cardiac: RRR, no JVD Vascular:  Vessel Right Left  Radial Palpable Palpable                          DP  2+  2+  PT  1+  trace   Gastrointestinal:. No masses, surgical incisions, or scars. Musculoskeletal: M/S 5/5 throughout.  Extremities without ischemic changes.  No deformity or atrophy.  Trace right lower extremity edema, 1+ left lower extremity edema.  Diffuse actinic keratoses are present throughout both lower legs Neurologic: Sensation grossly intact in extremities.  Symmetrical.  Speech is fluent. Motor exam as listed above. Psychiatric: Judgment intact, Mood & affect appropriate for pt's clinical situation. Dermatologic: No rashes or ulcers noted.  No cellulitis or open wounds.    Radiology No results found.  Labs No results found for this or any previous visit (from the past 2160 hour(s)).  Assessment/Plan:  Hypertension blood pressure control important in reducing the progression of atherosclerotic disease. On appropriate oral medications.   Arthropathy of lumbar facet joint Back issues could worsen lower extremity symptoms.  Swelling of limb I have had a long discussion with the patient  regarding swelling and why it  causes symptoms.  Patient will begin wearing graduated compression stockings class 1 (20-30 mmHg) on a daily basis a prescription was given. The patient will  beginning wearing the stockings first thing in the morning and removing them in the evening. The patient is instructed specifically not to sleep in the stockings.   In addition, behavioral modification will be initiated.  This will include frequent elevation, use of over the counter pain medications and exercise such as walking.  I have reviewed systemic causes for chronic edema such as liver, kidney and cardiac etiologies.  The patient denies problems with these organ systems.    Consideration for a lymph pump will also be made based upon the effectiveness of conservative therapy.  This would help to improve the edema control and prevent sequela such as ulcers and infections   Patient should undergo duplex ultrasound of the venous system to ensure that DVT or reflux is not present.  The patient will follow-up with me after the ultrasound.        Leotis Pain 01/07/2020, 11:42 AM   This note was created with Dragon medical transcription system.  Any errors from dictation are unintentional.

## 2020-01-07 NOTE — Assessment & Plan Note (Signed)
blood pressure control important in reducing the progression of atherosclerotic disease. On appropriate oral medications.  

## 2020-01-07 NOTE — Assessment & Plan Note (Signed)

## 2020-01-16 ENCOUNTER — Ambulatory Visit (INDEPENDENT_AMBULATORY_CARE_PROVIDER_SITE_OTHER): Payer: Medicare PPO | Admitting: Nurse Practitioner

## 2020-01-16 ENCOUNTER — Encounter (INDEPENDENT_AMBULATORY_CARE_PROVIDER_SITE_OTHER): Payer: Medicare PPO

## 2020-01-28 DIAGNOSIS — Z85828 Personal history of other malignant neoplasm of skin: Secondary | ICD-10-CM | POA: Insufficient documentation

## 2020-02-06 NOTE — Progress Notes (Deleted)
_ [facility]  Telephone:([phone] [phone]  ID: Stephanie Crosby OB: March 27, 1936  MR#: [id number]  YBW#:[id number]  [name], MD as PCP - General (Family Medicine) [name], MD as Consulting Physician (General Surgery)   CHIEF COMPLAINT:   1. Clinical stage IIb ER positive, PR negative, HER-2 overexpressing invasive lobular carcinoma overlapping sites of the left breast, now pathologic stage Ia (T1c,N0,M0). 2. Clinical stage Ia ER/PR positive, HER-2 negative invasive ductal carcinoma of the upper outer quadrant of right breast, now pathologic stage 0.  INTERVAL HISTORY: Patient returns to clinic today for routine 75-monthevaluation.  She recently had a CT scan completed by pulmonology for persistent shortness of breath, but otherwise has felt well.  She continues to have occasional joint pain and swelling. Her peripheral neuropathy is chronic and unchanged and does not affect her day-to-day activity. She has no other neurologic complaints. She denies any recent fevers or illnesses.  She denies any chest pain, shortness of breath, cough, or hemoptysis.  She denies nausea, vomiting, diarrhea, or constipation. She has no urinary complaints.  Patient offers no further specific complaints today.  REVIEW OF SYSTEMS:   Review of Systems  Constitutional: Negative.  Negative for fever, malaise/fatigue and weight loss.  HENT: Negative for sore throat.   Eyes: Negative for discharge and redness.  Respiratory: Positive for shortness of breath. Negative for cough and hemoptysis.   Cardiovascular: Negative.  Negative for chest pain and leg swelling.  Gastrointestinal: Negative for abdominal pain, diarrhea, nausea and vomiting.  Genitourinary: Negative.  Negative for dysuria.  Musculoskeletal: Positive for joint pain.  Skin: Negative.  Negative for rash.  Neurological: Positive for tingling and sensory change (Mild neuropathy ). Negative  for weakness and headaches.  Psychiatric/Behavioral: Negative.  The patient is not nervous/anxious and does not have insomnia.    As per HPI. Otherwise, a complete review of systems is negative.   PAST MEDICAL HISTORY: Past Medical History:  Diagnosis Date   Anemia    Back injury    [place] 05[place]   Chemo tx's.   [place] cancer [place]   Breast cancer in situ 05/2015   Left   Edema of leg    GERD (gastroesophageal reflux disease)    Gonalgia    Hiatal hernia    Personal history of chemotherapy    Personal history of radiation therapy    Seasonal allergic rhinitis    Sleep apnea    Squamous cell skin cancer 2011   resected from left side of nose and Right axilla area.    Tearing eyes    SINCE TAKING CHEMO    PAST SURGICAL HISTORY: Past Surgical History:  Procedure Laterality Date   APPENDECTOMY     AXILLARY SENTINEL NODE BIOPSY Bilateral 12/10/2015   Procedure: BILATERAL SENTINEL NODE BIOPSY, SENTINNEL NODE INJ.;  Surgeon: CClayburn Pert MD;  Location: [facility];  Service: General;  Laterality: Bilateral;   BREAST BIOPSY Bilateral 11/17/2015   Procedure: BREAST BIOPSY WITH NEEDLE LOCALIZATION;  Surgeon: CClayburn Pert MD;  Location: [facility];  Service: General;  Laterality: Bilateral;   BREAST LUMPECTOMY Bilateral 05/2015   01/17 chemo before radiation after   CATARACT EXTRACTION, BILATERAL Bilateral 2012   COLONOSCOPY     PORTACATH PLACEMENT Right 06/10/2015   Procedure: INSERTION PORT-A-CATH;  Surgeon: CClayburn Pert MD;  Location: [facility] OR[facility];  Service: General;  Laterality: Right;    FAMILY HISTORY: Patient reports a maternal aunt with breast cancer  as well as a female cousin with breast cancer.  Family History  Problem Relation Age of Onset   Dementia Mother    Hyperlipidemia Mother    Hypertension Mother    Congestive Heart Failure Mother    Heart disease Mother    Aneurysm Father        abdominal    Fibromyalgia Sister    Heart disease Sister    Cancer Maternal Uncle        brain   Heart disease Maternal Uncle    Pancreatic cancer Maternal Grandmother    Cancer Cousin        female cousin with breast cancer and brain cancer   Ovarian cancer Maternal Aunt    Colon cancer Maternal Aunt    Breast cancer Neg Hx        ADVANCED DIRECTIVES:    HEALTH MAINTENANCE: Social History   Tobacco Use   Smoking status: Never Smoker   Smokeless tobacco: Never Used  Scientific laboratory technician Use: Never used  Substance Use Topics   Alcohol use: Not Currently    Alcohol/week: 0.0 standard drinks    Comment: 3 glasses wine/week   Drug use: No      Allergies  Allergen Reactions   Penicillins Rash    Has patient had a PCN reaction causing immediate rash, facial/tongue/throat swelling, SOB or lightheadedness with hypotension: unsure Has patient had a PCN reaction causing severe rash involving mucus membranes or skin necrosis: no Has patient had a PCN reaction that required hospitalization no Has patient had a PCN reaction occurring within the last 10 years: no If all of the above answers are "NO", then may proceed with Cephalosporin use.    Current Outpatient Medications  Medication Sig Dispense Refill   acetaminophen (TYLENOL) 500 MG tablet Take 500 mg by mouth every 6 (six) hours as needed for mild pain.     alendronate (FOSAMAX) 70 MG tablet TAKE 1 TABLET EVERY 7 DAYS WITH A FULL GLASS OF WATER ON AN EMPTY STOMACH DO NOT LIE DOWN FOR AT LEAST 30 MIN 4 tablet 11   azelastine (ASTELIN) 0.1 % nasal spray      benzonatate (TESSALON) 200 MG capsule      calcium-vitamin D (CALCIUM 500/D) 500-200 MG-UNIT tablet Take 2 tablets by mouth daily with breakfast.      letrozole (FEMARA) 2.5 MG tablet TAKE 1 TABLET BY MOUTH DAILY 90 tablet 3   losartan (COZAAR) 50 MG tablet Take 50 mg by mouth daily.     Multiple Vitamins-Minerals (CENTRUM SILVER PO) Take 1 tablet by mouth daily.  Reported on 06/15/2015     omeprazole (PRILOSEC) 20 MG capsule Take 1 capsule by mouth every morning. Reported on 06/15/2015     valACYclovir (VALTREX) 500 MG tablet Take 500 mg by mouth daily as needed (break outs). Reported on 06/15/2015     No current facility-administered medications for this visit.  1 1  OBJECTIVE: There were no vitals filed for this visit.   There is no height or weight on file to calculate BMI.    ECOG FS:0 - Asymptomatic  General: Well-developed, well-nourished, no acute distress. Eyes: Pink conjunctiva, anicteric sclera. HEENT: Normocephalic, moist mucous membranes. Breast: Patient declined breast exam today. Lungs: Clear to auscultation bilaterally. Heart: Regular rate and rhythm. No rubs, murmurs, or gallops. Abdomen: Soft, nontender, nondistended. No organomegaly noted, normoactive bowel sounds. Musculoskeletal: No edema, cyanosis, or clubbing. Neuro: Alert, answering all questions appropriately. Cranial nerves grossly intact. Skin:  No rashes or petechiae noted. Psych: Normal affect.  LAB RESULTS:  Lab Results  Component Value Date   NA 136 01/09/2017   K 3.9 01/09/2017   CL 105 01/09/2017   CO2 24 01/09/2017   GLUCOSE 101 (H) 01/09/2017   BUN 20 01/09/2017   CREATININE 0.66 01/09/2017   CALCIUM 9.5 01/09/2017   PROT 6.9 01/09/2017   ALBUMIN 3.9 01/09/2017   AST 22 01/09/2017   ALT 18 01/09/2017   ALKPHOS 42 01/09/2017   BILITOT 1.6 (H) 01/09/2017   GFRNONAA >60 01/09/2017   GFRAA >60 01/09/2017    Lab Results  Component Value Date   WBC 4.2 01/17/2018   NEUTROABS 2.1 01/17/2018   HGB 14.2 01/17/2018   HCT 41.2 01/17/2018   MCV 92.6 01/17/2018   PLT 185 01/17/2018   Lab Results  Component Value Date   IRON 122 03/14/2016   TIBC 298 03/14/2016   IRONPCTSAT 41 (H) 03/14/2016   Lab Results  Component Value Date   FERRITIN 61 03/14/2016     STUDIES: No results found.  ASSESSMENT:   1. Clinical stage IIb ER positive, PR  negative, HER-2 overexpressing invasive lobular carcinoma overlapping sites of the left breast, now pathologic stage Ia (T1c,N0,M0). 2. Clinical stage Ia ER/PR positive, HER-2 negative invasive ductal carcinoma of the upper outer quadrant of right breast, now pathologic stage 0. 3. BRCA 1 and 2 negative.  PLAN:    1. Clinical stage IIb ER positive, PR negative, HER-2 overexpressing invasive lobular carcinoma overlapping sites of the left breast, pathologic stage Ia (T1c,N0,M0): Patient noted to have residual stage IA tumor and her left breast with a complete pathologic response in her right breast. Patient completed 6 cycles of chemotherapy in June 2017 and year-long Herceptin maintenance on July 11, 2016.  Continue letrozole for a total of 5 years completing in March 2023.  Her most recent mammogram on July 17, 2019 was reported as BI-RADS 2.  Repeat in March 2022.  Return to clinic in 6 months for routine evaluation.   2. Clinical stage Ia ER/PR positive, HER-2 negative invasive ductal carcinoma of the upper outer quadrant of right breast, pathologic stage 0: Complete pathologic response. Patient has completed XRT.  Letrozole and mammogram as above.   3.  Shortness of breath/history of pulmonary nodule: CT scan results from July 30, 2019 reviewed independently and reported as above with no mention of pulmonary nodule.  Continue follow-up with pulmonology as indicated.   4. Osteoporosis: Patient's most recent bone mineral density on July 17, 2019 reported T score of -2.2 which is essentially unchanged from 1 year prior.  Continue Fosamax, calcium, and vitamin D supplementation.  Repeat in March 2022. 5.  TMJ pain: Patient does not complain of this today.  Patient expressed understanding and was in agreement with this plan. She also understands that She can call clinic at any time with any questions, concerns, or complaints.    Lloyd Huger, MD 02/06/20 4:10 PM

## 2020-02-13 ENCOUNTER — Inpatient Hospital Stay: Payer: Medicare PPO | Admitting: Oncology

## 2020-02-14 NOTE — Progress Notes (Signed)
_ [place]  Telephone:([phone] [phone]  ID: Stephanie Crosby OB: 1936/01/30  MR#: [id number]  EXN#:[id number]  [name], MD as PCP - General (Family Medicine) [name], MD as Consulting Physician (General Surgery)   CHIEF COMPLAINT:   1. Clinical stage IIb ER positive, PR negative, HER-2 overexpressing invasive lobular carcinoma overlapping sites of the left breast, now pathologic stage Ia (T1c,N0,M0). 2. Clinical stage Ia ER/PR positive, HER-2 negative invasive ductal carcinoma of the upper outer quadrant of right breast, now pathologic stage 0.  INTERVAL HISTORY: Patient returns to clinic today for routine 64-monthevaluation.  She continues to have multiple medical complaints including dyspnea exertion, multiple skin lesions being treated by dermatology, and vascular issues of her lower extremities.  She is tolerating letrozole without significant side effects. Her peripheral neuropathy is chronic and unchanged and does not affect her day-to-day activity. She has no other neurologic complaints. She denies any recent fevers or illnesses.  She denies any chest pain, cough, or hemoptysis.  She denies nausea, vomiting, diarrhea, or constipation. She has no urinary complaints.  Patient offers no further specific complaints today.  REVIEW OF SYSTEMS:   Review of Systems  Constitutional: Negative.  Negative for fever, malaise/fatigue and weight loss.  HENT: Negative for sore throat.   Eyes: Negative for discharge and redness.  Respiratory: Positive for shortness of breath. Negative for cough and hemoptysis.   Cardiovascular: Negative.  Negative for chest pain and leg swelling.  Gastrointestinal: Negative for abdominal pain, diarrhea, nausea and vomiting.  Genitourinary: Negative.  Negative for dysuria.  Musculoskeletal: Positive for joint pain.  Skin: Negative.  Negative for rash.  Neurological: Positive for tingling and  sensory change (Mild neuropathy ). Negative for weakness and headaches.  Psychiatric/Behavioral: Negative.  The patient is not nervous/anxious and does not have insomnia.    As per HPI. Otherwise, a complete review of systems is negative.   PAST MEDICAL HISTORY: Past Medical History:  Diagnosis Date  . Anemia   . Back injury   . Bilateral breast cancer ([place] 05/2015   Chemo tx's.  . Breast cancer ([facility] 2017  . Breast cancer in situ 05/2015   Left  . Edema of leg   . GERD (gastroesophageal reflux disease)   . Gonalgia   . Hiatal hernia   . Personal history of chemotherapy   . Personal history of radiation therapy   . Seasonal allergic rhinitis   . Sleep apnea   . Squamous cell skin cancer 2011   resected from left side of nose and Right axilla area.   . Tearing eyes    SINCE TAKING CHEMO    PAST SURGICAL HISTORY: Past Surgical History:  Procedure Laterality Date  . APPENDECTOMY    . AXILLARY SENTINEL NODE BIOPSY Bilateral 12/10/2015   Procedure: BILATERAL SENTINEL NODE BIOPSY, SENTINNEL NODE INJ.;  Surgeon: CClayburn Pert MD;  Location: [facility] ORS;  Service: General;  Laterality: Bilateral;  . BREAST BIOPSY Bilateral 11/17/2015   Procedure: BREAST BIOPSY WITH NEEDLE LOCALIZATION;  Surgeon: CClayburn Pert MD;  Location: [facility] ORS;  Service: General;  Laterality: Bilateral;  . BREAST LUMPECTOMY Bilateral 05/2015   01/17 chemo before radiation after  . CATARACT EXTRACTION, BILATERAL Bilateral 2012  . COLONOSCOPY    . PORTACATH PLACEMENT Right 06/10/2015   Procedure: INSERTION PORT-A-CATH;  Surgeon: CClayburn Pert MD;  Location: [facility] ORS;  Service: General;  Laterality: Right;    FAMILY HISTORY: Patient reports a maternal aunt with breast  cancer as well as a female cousin with breast cancer.  Family History  Problem Relation Age of Onset  . Dementia Mother   . Hyperlipidemia Mother   . Hypertension Mother   . Congestive Heart Failure Mother   . Heart disease Mother   .  Aneurysm Father        abdominal  . Fibromyalgia Sister   . Heart disease Sister   . Cancer Maternal Uncle        brain  . Heart disease Maternal Uncle   . Pancreatic cancer Maternal Grandmother   . Cancer Cousin        female cousin with breast cancer and brain cancer  . Ovarian cancer Maternal Aunt   . Colon cancer Maternal Aunt   . Breast cancer Neg Hx        ADVANCED DIRECTIVES:    HEALTH MAINTENANCE: Social History   Tobacco Use  . Smoking status: Never Smoker  . Smokeless tobacco: Never Used  Vaping Use  . Vaping Use: Never used  Substance Use Topics  . Alcohol use: Not Currently    Alcohol/week: 0.0 standard drinks    Comment: 3 glasses wine/week  . Drug use: No      Allergies  Allergen Reactions  . Penicillins Rash    Has patient had a PCN reaction causing immediate rash, facial/tongue/throat swelling, SOB or lightheadedness with hypotension: unsure Has patient had a PCN reaction causing severe rash involving mucus membranes or skin necrosis: no Has patient had a PCN reaction that required hospitalization no Has patient had a PCN reaction occurring within the last 10 years: no If all of the above answers are "NO", then may proceed with Cephalosporin use.    Current Outpatient Medications  Medication Sig Dispense Refill  . acetaminophen (TYLENOL) 500 MG tablet Take 500 mg by mouth every 6 (six) hours as needed for mild pain.    Marland Kitchen alendronate (FOSAMAX) 70 MG tablet TAKE 1 TABLET EVERY 7 DAYS WITH A FULL GLASS OF WATER ON AN EMPTY STOMACH DO NOT LIE DOWN FOR AT LEAST 30 MIN 4 tablet 11  . azelastine (ASTELIN) 0.1 % nasal spray     . calcium-vitamin D (CALCIUM 500/D) 500-200 MG-UNIT tablet Take 2 tablets by mouth daily with breakfast.     . letrozole (FEMARA) 2.5 MG tablet TAKE 1 TABLET BY MOUTH DAILY 90 tablet 3  . losartan (COZAAR) 50 MG tablet Take 50 mg by mouth daily.    . Multiple Vitamins-Minerals (CENTRUM SILVER PO) Take 1 tablet by mouth daily.  Reported on 06/15/2015    . omeprazole (PRILOSEC) 20 MG capsule Take 1 capsule by mouth every morning. Reported on 06/15/2015    . valACYclovir (VALTREX) 500 MG tablet Take 500 mg by mouth daily as needed (break outs). Reported on 06/15/2015    . benzonatate (TESSALON) 200 MG capsule  (Patient not taking: Reported on 02/21/2020)     No current facility-administered medications for this visit.  1 1  OBJECTIVE: Vitals:   02/21/20 1030  BP: (!) 166/109  Pulse: 76  Resp: 16  Temp: (!) 97.1 F (36.2 C)  SpO2: 98%     Body mass index is 27.81 kg/m.    ECOG FS:0 - Asymptomatic  General: Well-developed, well-nourished, no acute distress. Eyes: Pink conjunctiva, anicteric sclera. HEENT: Normocephalic, moist mucous membranes. Breast: Patient deferred exam today. Lungs: No audible wheezing or coughing. Heart: Regular rate and rhythm. Abdomen: Soft, nontender, no obvious distention. Musculoskeletal: No edema, cyanosis, or  clubbing. Neuro: Alert, answering all questions appropriately. Cranial nerves grossly intact. Skin: No rashes or petechiae noted. Psych: Normal affect.   LAB RESULTS:  Lab Results  Component Value Date   NA 136 01/09/2017   K 3.9 01/09/2017   CL 105 01/09/2017   CO2 24 01/09/2017   GLUCOSE 101 (H) 01/09/2017   BUN 20 01/09/2017   CREATININE 0.66 01/09/2017   CALCIUM 9.5 01/09/2017   PROT 6.9 01/09/2017   ALBUMIN 3.9 01/09/2017   AST 22 01/09/2017   ALT 18 01/09/2017   ALKPHOS 42 01/09/2017   BILITOT 1.6 (H) 01/09/2017   GFRNONAA >60 01/09/2017   GFRAA >60 01/09/2017    Lab Results  Component Value Date   WBC 4.2 01/17/2018   NEUTROABS 2.1 01/17/2018   HGB 14.2 01/17/2018   HCT 41.2 01/17/2018   MCV 92.6 01/17/2018   PLT 185 01/17/2018   Lab Results  Component Value Date   IRON 122 03/14/2016   TIBC 298 03/14/2016   IRONPCTSAT 41 (H) 03/14/2016   Lab Results  Component Value Date   FERRITIN 61 03/14/2016     STUDIES: No results  found.  ASSESSMENT:   1. Clinical stage IIb ER positive, PR negative, HER-2 overexpressing invasive lobular carcinoma overlapping sites of the left breast, now pathologic stage Ia (T1c,N0,M0). 2. Clinical stage Ia ER/PR positive, HER-2 negative invasive ductal carcinoma of the upper outer quadrant of right breast, now pathologic stage 0. 3. BRCA 1 and 2 negative.  PLAN:    1. Clinical stage IIb ER positive, PR negative, HER-2 overexpressing invasive lobular carcinoma overlapping sites of the left breast, pathologic stage Ia (T1c,N0,M0): Patient noted to have residual stage IA tumor and her left breast with a complete pathologic response in her right breast. Patient completed 6 cycles of chemotherapy in June 2017 and year-long Herceptin maintenance on July 11, 2016.  Continue letrozole for a total of 5 years completing treatment in March 2023. Her most recent mammogram on July 17, 2019 was reported as BI-RADS 2.  Repeat in March 2022.  Return to clinic in 6 months for routine evaluation.   2. Clinical stage Ia ER/PR positive, HER-2 negative invasive ductal carcinoma of the upper outer quadrant of right breast, pathologic stage 0: Complete pathologic response. Patient has completed XRT.  Letrozole mammogram as above. 3.  Shortness of breath/history of pulmonary nodule: CT scan results from July 30, 2019 reviewed independently and reported as above with no mention of pulmonary nodule.  Continue follow-up with pulmonology as indicated.   4. Osteoporosis: Patient's most recent bone mineral density on July 17, 2019 reported T score of -2.2 which is essentially unchanged from 1 year prior.  Continue Fosamax, calcium, and vitamin D supplementation.  Repeat in March 2022 along with mammogram as above. 5.  Tooth pain: Patient had tooth extraction approximately a week and a half ago and reports her pain is slowly improving.  Patient expressed understanding and was in agreement with this plan. She also  understands that She can call clinic at any time with any questions, concerns, or complaints.    Lloyd Huger, MD 02/21/20 12:20 PM

## 2020-02-18 ENCOUNTER — Ambulatory Visit (INDEPENDENT_AMBULATORY_CARE_PROVIDER_SITE_OTHER): Payer: Medicare PPO | Admitting: Nurse Practitioner

## 2020-02-18 ENCOUNTER — Encounter (INDEPENDENT_AMBULATORY_CARE_PROVIDER_SITE_OTHER): Payer: Medicare PPO

## 2020-02-21 ENCOUNTER — Inpatient Hospital Stay: Payer: Medicare PPO | Attending: Oncology | Admitting: Oncology

## 2020-02-21 ENCOUNTER — Other Ambulatory Visit: Payer: Self-pay

## 2020-02-21 ENCOUNTER — Encounter: Payer: Self-pay | Admitting: Oncology

## 2020-02-21 VITALS — BP 166/109 | HR 76 | Temp 97.1°F | Resp 16 | Wt 172.3 lb

## 2020-02-21 DIAGNOSIS — Z79811 Long term (current) use of aromatase inhibitors: Secondary | ICD-10-CM | POA: Insufficient documentation

## 2020-02-21 DIAGNOSIS — Z803 Family history of malignant neoplasm of breast: Secondary | ICD-10-CM | POA: Diagnosis not present

## 2020-02-21 DIAGNOSIS — G4730 Sleep apnea, unspecified: Secondary | ICD-10-CM | POA: Diagnosis not present

## 2020-02-21 DIAGNOSIS — Z923 Personal history of irradiation: Secondary | ICD-10-CM | POA: Diagnosis not present

## 2020-02-21 DIAGNOSIS — Z85828 Personal history of other malignant neoplasm of skin: Secondary | ICD-10-CM | POA: Diagnosis not present

## 2020-02-21 DIAGNOSIS — K219 Gastro-esophageal reflux disease without esophagitis: Secondary | ICD-10-CM | POA: Insufficient documentation

## 2020-02-21 DIAGNOSIS — C50411 Malignant neoplasm of upper-outer quadrant of right female breast: Secondary | ICD-10-CM | POA: Diagnosis not present

## 2020-02-21 DIAGNOSIS — Z8249 Family history of ischemic heart disease and other diseases of the circulatory system: Secondary | ICD-10-CM | POA: Diagnosis not present

## 2020-02-21 DIAGNOSIS — C50812 Malignant neoplasm of overlapping sites of left female breast: Secondary | ICD-10-CM

## 2020-02-21 DIAGNOSIS — Z8349 Family history of other endocrine, nutritional and metabolic diseases: Secondary | ICD-10-CM | POA: Diagnosis not present

## 2020-02-21 DIAGNOSIS — Z170 Estrogen receptor positive status [ER+]: Secondary | ICD-10-CM | POA: Diagnosis not present

## 2020-02-21 DIAGNOSIS — Z79899 Other long term (current) drug therapy: Secondary | ICD-10-CM | POA: Diagnosis not present

## 2020-02-21 DIAGNOSIS — G629 Polyneuropathy, unspecified: Secondary | ICD-10-CM | POA: Insufficient documentation

## 2020-02-21 DIAGNOSIS — Z9221 Personal history of antineoplastic chemotherapy: Secondary | ICD-10-CM | POA: Diagnosis not present

## 2020-02-21 DIAGNOSIS — Z8041 Family history of malignant neoplasm of ovary: Secondary | ICD-10-CM | POA: Insufficient documentation

## 2020-02-21 DIAGNOSIS — Z800 Family history of malignant neoplasm of digestive organs: Secondary | ICD-10-CM | POA: Diagnosis not present

## 2020-02-21 NOTE — Progress Notes (Signed)
Patient here for oncology follow-up appointment, expresses concerns of elevated BP, headaches and shortness of breath.

## 2020-02-23 ENCOUNTER — Emergency Department
Admission: EM | Admit: 2020-02-23 | Discharge: 2020-02-23 | Disposition: A | Discharge status: Home / Self Care | Payer: Medicare PPO | Attending: Emergency Medicine | Admitting: Emergency Medicine

## 2020-02-23 ENCOUNTER — Other Ambulatory Visit: Payer: Self-pay

## 2020-02-23 ENCOUNTER — Encounter: Payer: Self-pay | Admitting: Emergency Medicine

## 2020-02-23 ENCOUNTER — Emergency Department: Payer: Medicare PPO

## 2020-02-23 DIAGNOSIS — Z79899 Other long term (current) drug therapy: Secondary | ICD-10-CM | POA: Insufficient documentation

## 2020-02-23 DIAGNOSIS — I10 Essential (primary) hypertension: Secondary | ICD-10-CM | POA: Insufficient documentation

## 2020-02-23 DIAGNOSIS — S20212A Contusion of left front wall of thorax, initial encounter: Secondary | ICD-10-CM | POA: Insufficient documentation

## 2020-02-23 DIAGNOSIS — W010XXA Fall on same level from slipping, tripping and stumbling without subsequent striking against object, initial encounter: Secondary | ICD-10-CM | POA: Insufficient documentation

## 2020-02-23 DIAGNOSIS — Z85828 Personal history of other malignant neoplasm of skin: Secondary | ICD-10-CM | POA: Diagnosis not present

## 2020-02-23 DIAGNOSIS — Z853 Personal history of malignant neoplasm of breast: Secondary | ICD-10-CM | POA: Diagnosis not present

## 2020-02-23 DIAGNOSIS — S299XXA Unspecified injury of thorax, initial encounter: Secondary | ICD-10-CM | POA: Diagnosis present

## 2020-02-23 MED ORDER — OXYCODONE-ACETAMINOPHEN 5-325 MG PO TABS: 1.0000 | ORAL_TABLET | Freq: Four times a day (QID) | ORAL | 0 refills | Status: DC | PRN

## 2020-02-23 MED ORDER — HYDROCODONE-ACETAMINOPHEN 5-325 MG PO TABS
1.0000 | ORAL_TABLET | Freq: Once | ORAL | Status: AC
  Administered 2020-02-23: 1 via ORAL
  Filled 2020-02-23: qty 1

## 2020-02-23 NOTE — ED Triage Notes (Signed)
Pt in via EMS from home with c/o fall. Pt slipped on her slippers and fell hitting her chest on the corner of a rounded desk. No LOC, no obvious injuries. Pt is allergic to PCN

## 2020-02-23 NOTE — ED Provider Notes (Signed)
[facility] Emergency Department Provider Note   ____________________________________________    I have reviewed the triage vital signs and the nursing notes.   HISTORY  Chief Complaint Fall     HPI Stephanie Crosby is a 84 y.o. female who presents status post fall.  Patient reports she tripped fell forward and struck her left chest against the corner of a desk.  This happened this morning.  She denies other injuries.  She reports that she struck the desk dyspnea through her left breast.  She reports pain in that area when she takes a deep breath and when she moves her left arm.  No head injury.  No extremity injuries.  Took Tylenol with little improvement  Past Medical History:  Diagnosis Date  . Anemia   . Back injury   . Bilateral breast cancer ([place]) 05/2015   Chemo tx's.  . Breast cancer ([place]) 2017  . Breast cancer in situ 05/2015   Left  . Edema of leg   . GERD (gastroesophageal reflux disease)   . Gonalgia   . Hiatal hernia   . Personal history of chemotherapy   . Personal history of radiation therapy   . Seasonal allergic rhinitis   . Sleep apnea   . Squamous cell skin cancer 2011   resected from left side of nose and Right axilla area.   . Tearing eyes    SINCE TAKING CHEMO    Patient Active Problem List   Diagnosis Date Noted  . Swelling of limb 01/07/2020  . Hypertension 05/03/2019  . Hiatal hernia with gastroesophageal reflux 01/28/2019  . Cancer of right breast ([place]) 01/28/2019  . Dyspnea on exertion 04/02/2018  . Malignant neoplasm of overlapping sites of left breast in female, estrogen receptor positive ([place]) 03/20/2018  . Breast cancer of upper-outer quadrant of right female breast ([place])   . Benign breast lumps 06/01/2015  . Anemia, iron deficiency 06/01/2015  . Left knee pain 06/01/2015  . Allergic rhinitis, seasonal 06/01/2015  . Apnea, sleep 06/01/2015  . Cancer of overlapping sites of left female breast ([place])  05/30/2015  . Arthropathy of lumbar facet joint 10/14/2014  . Thoracic back sprain 10/14/2014    Past Surgical History:  Procedure Laterality Date  . APPENDECTOMY    . AXILLARY SENTINEL NODE BIOPSY Bilateral 12/10/2015   Procedure: BILATERAL SENTINEL NODE BIOPSY, SENTINNEL NODE INJ.;  Surgeon: [name], MD;  Location: [facility] ORS;  Service: General;  Laterality: Bilateral;  . BREAST BIOPSY Bilateral 11/17/2015   Procedure: BREAST BIOPSY WITH NEEDLE LOCALIZATION;  Surgeon: [name], MD;  Location: [facility] ORS;  Service: General;  Laterality: Bilateral;  . BREAST LUMPECTOMY Bilateral 05/2015   01/17 chemo before radiation after  . CATARACT EXTRACTION, BILATERAL Bilateral 2012  . COLONOSCOPY    . PORTACATH PLACEMENT Right 06/10/2015   Procedure: INSERTION PORT-A-CATH;  Surgeon: [name], MD;  Location: [facility] ORS;  Service: General;  Laterality: Right;    Prior to Admission medications   Medication Sig Start Date End Date Taking? Authorizing Provider  acetaminophen (TYLENOL) 500 MG tablet Take 500 mg by mouth every 6 (six) hours as needed for mild pain.   Yes [provider]  alendronate (FOSAMAX) 70 MG tablet TAKE 1 TABLET EVERY 7 DAYS WITH A FULL GLASS OF WATER ON AN EMPTY STOMACH DO NOT LIE DOWN FOR AT LEAST 30 MIN Patient taking differently: Take 70 mg by mouth once a week.  07/16/19  Yes Lloyd Huger, MD  azelastine (ASTELIN) 0.1 % nasal spray Place 1 spray into both nostrils 2 (two) times daily.  05/12/18  Yes [provider]  calcium-vitamin D (CALCIUM 500/D) 500-200 MG-UNIT tablet Take 2 tablets by mouth daily with breakfast.    Yes [provider]  letrozole ([place]) 2.5 MG tablet TAKE 1 TABLET BY MOUTH DAILY Patient taking differently: Take 2.5 mg by mouth daily.  03/18/19  Yes Lloyd Huger, MD  losartan (COZAAR) 50 MG tablet Take 50 mg by mouth daily. 07/24/19  Yes [provider]  Multiple Vitamins-Minerals (CENTRUM SILVER  PO) Take 1 tablet by mouth daily. Reported on 06/15/2015   Yes [provider]  omeprazole (PRILOSEC) 20 MG capsule Take 20 mg by mouth daily.    Yes [provider]  valACYclovir (VALTREX) 500 MG tablet Take 500 mg by mouth daily as needed (break outs).    Yes [provider]  oxyCODONE-acetaminophen (PERCOCET) 5-325 MG tablet Take 1 tablet by mouth every 6 (six) hours as needed for severe pain. 02/23/20 02/22/21  Lavonia Drafts, MD     Allergies Penicillins  Family History  Problem Relation Age of Onset  . Dementia Mother   . Hyperlipidemia Mother   . Hypertension Mother   . Congestive Heart Failure Mother   . Heart disease Mother   . Aneurysm Father        abdominal  . Fibromyalgia Sister   . Heart disease Sister   . Cancer Maternal Uncle        brain  . Heart disease Maternal Uncle   . Pancreatic cancer Maternal Grandmother   . Cancer Cousin        female cousin with breast cancer and brain cancer  . Ovarian cancer Maternal Aunt   . Colon cancer Maternal Aunt   . Breast cancer Neg Hx     Social History Social History   Tobacco Use  . Smoking status: Never Smoker  . Smokeless tobacco: Never Used  Vaping Use  . Vaping Use: Never used  Substance Use Topics  . Alcohol use: Not Currently    Alcohol/week: 0.0 standard drinks    Comment: 3 glasses wine/week  . Drug use: No    Review of Systems  Constitutional: No dizziness Eyes: No visual changes.  ENT: No neck pain Cardiovascular: As above Respiratory: Denies shortness of breath. Gastrointestinal: No abdominal pain.  No nausea, no vomiting.   Genitourinary: Negative for dysuria. Musculoskeletal: Negative for back pain. Skin: Negative for laceration or abrasion Neurological: Negative for headaches   ____________________________________________   PHYSICAL EXAM:  VITAL SIGNS: ED Triage Vitals  Enc Vitals Group     BP 02/23/20 1119 (S) (!) 198/108     Pulse Rate 02/23/20 1119 86       Resp 02/23/20 1119 16     Temp 02/23/20 1119 98.1 F (36.7 C)     Temp Source 02/23/20 1119 Oral     SpO2 02/23/20 1119 96 %     Weight 02/23/20 1121 77.1 kg (170 lb)     Height 02/23/20 1121 1.676 m (5\' 6" )     Head Circumference --      Peak Flow --      Pain Score 02/23/20 1120 8     Pain Loc --      Pain Edu? --      Excl. in [place]? --     Constitutional: Alert and oriented. No acute distress. Pleasant and interactive  Nose: No congestion/rhinnorhea. Mouth/Throat: Mucous  membranes are moist.   Neck:  Painless ROM Cardiovascular: Normal rate, regular rhythm. Grossly normal heart sounds.  Good peripheral circulation.  Mild chest wall tenderness lateral to the lower sternum, no bony normalities palpated, no bruising Respiratory: Normal respiratory effort.  No retractions.  Gastrointestinal: Soft and nontender. No distention.  No CVA tenderness.  Musculoskeletal: No lower extremity tenderness nor edema.  Warm and well perfused Neurologic:  Normal speech and language. No gross focal neurologic deficits are appreciated.  Skin:  Skin is warm, dry and intact. No rash noted. Psychiatric: Mood and affect are normal. Speech and behavior are normal.  ____________________________________________   LABS (all labs ordered are listed, but only abnormal results are displayed)  Labs Reviewed - No data to display ____________________________________________  EKG  ED ECG REPORT I, Lavonia Drafts, the attending physician, personally viewed and interpreted this ECG.  Date: 02/23/2020  Rhythm: normal sinus rhythm QRS Axis: normal Intervals: normal ST/T Wave abnormalities: normal Narrative Interpretation: no evidence of acute ischemia  ____________________________________________  RADIOLOGY  Chest x-ray viewed by me, no rib fractures noted, confirmed by radiology CT chest reviewed by me, no rib factors noted, pending radiology  review ____________________________________________   PROCEDURES  Procedure(s) performed: No  Procedures   Critical Care performed: No ____________________________________________   INITIAL IMPRESSION / ASSESSMENT AND PLAN / ED COURSE  Pertinent labs & imaging results that were available during my care of the patient were reviewed by me and considered in my medical decision making (see chart for details).  Patient presents with chest wall pain after fall today.  Differential includes chest wall contusion, rib fracture.  X-ray is overall reassuring but given her tenderness and pain with any movement and history of osteopenia will send for CT chest  Pain treated with p.o. Vicodin with improvement.  CT scan reviewed by me, no acute fractures, confirmed by radiology.  Will treat for chest wall contusion, analgesics provided, recommend ice, breathing exercises.    ____________________________________________   FINAL CLINICAL IMPRESSION(S) / ED DIAGNOSES  Final diagnoses:  Chest wall contusion, left, initial encounter        Note:  This document was prepared using Dragon voice recognition software and may include unintentional dictation errors.   Lavonia Drafts, MD 02/23/20 1452

## 2020-02-23 NOTE — ED Triage Notes (Signed)
Pt to ED via POV, pt states that she tripped this morning and hit her chest on the corner of a large desk. Pt denies hitting head or LOC. Pt states that it hurts for her to take a deep breath. Pt is in NAD.

## 2020-05-04 ENCOUNTER — Other Ambulatory Visit: Payer: Self-pay | Admitting: Oncology

## 2020-06-08 ENCOUNTER — Ambulatory Visit
Admission: RE | Admit: 2020-06-08 | Discharge: 2020-06-08 | Disposition: A | Discharge status: Home / Self Care | Payer: Medicare PPO | Source: Ambulatory Visit | Attending: Radiation Oncology | Admitting: Radiation Oncology

## 2020-06-08 ENCOUNTER — Encounter: Payer: Self-pay | Admitting: Radiation Oncology

## 2020-06-08 VITALS — BP 152/93 | HR 92 | Temp 97.4°F | Wt 173.0 lb

## 2020-06-08 DIAGNOSIS — Z170 Estrogen receptor positive status [ER+]: Secondary | ICD-10-CM | POA: Diagnosis not present

## 2020-06-08 DIAGNOSIS — Z79811 Long term (current) use of aromatase inhibitors: Secondary | ICD-10-CM | POA: Diagnosis not present

## 2020-06-08 DIAGNOSIS — C50411 Malignant neoplasm of upper-outer quadrant of right female breast: Secondary | ICD-10-CM | POA: Insufficient documentation

## 2020-06-08 DIAGNOSIS — C50812 Malignant neoplasm of overlapping sites of left female breast: Secondary | ICD-10-CM

## 2020-06-08 DIAGNOSIS — Z923 Personal history of irradiation: Secondary | ICD-10-CM | POA: Insufficient documentation

## 2020-06-08 NOTE — Progress Notes (Signed)
Radiation Oncology Follow up Note  Name: Stephanie Crosby   Date:   06/08/2020 MRN:  [id number] DOB: 1935/06/29    This 85 y.o. female presents to the clinic today for 4-year follow-up status post bilateral breast radiation for lobular invasive carcinoma stage Ia of her right breast and DCIS ER/PR positive the left breast.  REFERRING PROVIDER: Maryland Pink, MD  HPI: Patient is an 85 year old female now out over 4 years having completed bilateral breast radiation. She is seen today in routine follow-up is doing well from a breast standpoint. She had a traumatic fall and his had some pain in her chest and some swelling over her sternum although she had a CT scan which I have reviewed. No acute findings within the chest no evidence of rib fracture. She had mammograms back in March which were fine she also has new mammograms next month. She is currently on Femara tolerating it well without side effect.  COMPLICATIONS OF TREATMENT: none  FOLLOW UP COMPLIANCE: keeps appointments   PHYSICAL EXAM:  BP (!) 152/93   Pulse 92   Temp (!) 97.4 F (36.3 C) (Tympanic)   Wt 173 lb (78.5 kg)   BMI 27.92 kg/m  Lungs are clear to A&P cardiac examination essentially unremarkable with regular rate and rhythm. No dominant mass or nodularity is noted in either breast in 2 positions examined. Incision is well-healed. No axillary or supraclavicular adenopathy is appreciated. Cosmetic result is excellent. She does have some prominence over the sternum although it is nontender in this area and CT scan was within normal limits. Well-developed well-nourished patient in NAD. HEENT reveals PERLA, EOMI, discs not visualized.  Oral cavity is clear. No oral mucosal lesions are identified. Neck is clear without evidence of cervical or supraclavicular adenopathy. Lungs are clear to A&P. Cardiac examination is essentially unremarkable with regular rate and rhythm without murmur rub or thrill. Abdomen is benign with no  organomegaly or masses noted. Motor sensory and DTR levels are equal and symmetric in the upper and lower extremities. Cranial nerves II through XII are grossly intact. Proprioception is intact. No peripheral adenopathy or edema is identified. No motor or sensory levels are noted. Crude visual fields are within normal range.  RADIOLOGY RESULTS: CT scans of the chest reviewed  PLAN: Present time from a breath standpoint she is doing well she is net of out over 4 years I am could turn follow-up care over to medical oncology and her other providers. I be happy to reevaluate her at any time should that be indicated. Again do not see any pathology on her CT scan from her recent fall. She continues on Femara without side effect. Patient knows to call with any concerns.  I would like to take this opportunity to thank you for allowing me to participate in the care of your patient.Noreene Filbert, MD

## 2020-07-21 ENCOUNTER — Other Ambulatory Visit: Payer: Self-pay

## 2020-07-21 ENCOUNTER — Ambulatory Visit
Admission: RE | Admit: 2020-07-21 | Discharge: 2020-07-21 | Disposition: A | Discharge status: Home / Self Care | Payer: Medicare PPO | Source: Ambulatory Visit | Attending: Oncology | Admitting: Oncology

## 2020-07-21 DIAGNOSIS — Z923 Personal history of irradiation: Secondary | ICD-10-CM | POA: Insufficient documentation

## 2020-07-21 DIAGNOSIS — C50812 Malignant neoplasm of overlapping sites of left female breast: Secondary | ICD-10-CM | POA: Insufficient documentation

## 2020-07-21 DIAGNOSIS — Z780 Asymptomatic menopausal state: Secondary | ICD-10-CM | POA: Insufficient documentation

## 2020-07-21 DIAGNOSIS — M8589 Other specified disorders of bone density and structure, multiple sites: Secondary | ICD-10-CM | POA: Insufficient documentation

## 2020-07-21 DIAGNOSIS — Z9221 Personal history of antineoplastic chemotherapy: Secondary | ICD-10-CM | POA: Insufficient documentation

## 2020-07-21 DIAGNOSIS — Z13820 Encounter for screening for osteoporosis: Secondary | ICD-10-CM | POA: Insufficient documentation

## 2020-07-21 DIAGNOSIS — Z853 Personal history of malignant neoplasm of breast: Secondary | ICD-10-CM | POA: Insufficient documentation

## 2020-07-21 DIAGNOSIS — Z170 Estrogen receptor positive status [ER+]: Secondary | ICD-10-CM

## 2020-07-23 ENCOUNTER — Other Ambulatory Visit: Payer: Self-pay | Admitting: Thoracic Surgery (Cardiothoracic Vascular Surgery)

## 2020-07-23 DIAGNOSIS — K219 Gastro-esophageal reflux disease without esophagitis: Secondary | ICD-10-CM

## 2020-07-23 DIAGNOSIS — K449 Diaphragmatic hernia without obstruction or gangrene: Secondary | ICD-10-CM

## 2020-07-24 ENCOUNTER — Institutional Professional Consult (permissible substitution) (INDEPENDENT_AMBULATORY_CARE_PROVIDER_SITE_OTHER): Payer: Medicare PPO | Admitting: Thoracic Surgery (Cardiothoracic Vascular Surgery)

## 2020-07-24 ENCOUNTER — Other Ambulatory Visit: Payer: Self-pay | Admitting: Thoracic Surgery (Cardiothoracic Vascular Surgery)

## 2020-07-24 ENCOUNTER — Other Ambulatory Visit: Payer: Self-pay

## 2020-07-24 ENCOUNTER — Ambulatory Visit
Admission: RE | Admit: 2020-07-24 | Discharge: 2020-07-24 | Disposition: A | Discharge status: Home / Self Care | Payer: Medicare PPO | Source: Ambulatory Visit | Attending: Thoracic Surgery (Cardiothoracic Vascular Surgery) | Admitting: Thoracic Surgery (Cardiothoracic Vascular Surgery)

## 2020-07-24 ENCOUNTER — Encounter: Payer: Self-pay | Admitting: Thoracic Surgery (Cardiothoracic Vascular Surgery)

## 2020-07-24 VITALS — BP 170/96 | HR 92 | Resp 20 | Ht 66.0 in | Wt 174.0 lb

## 2020-07-24 DIAGNOSIS — K449 Diaphragmatic hernia without obstruction or gangrene: Secondary | ICD-10-CM

## 2020-07-24 DIAGNOSIS — K219 Gastro-esophageal reflux disease without esophagitis: Secondary | ICD-10-CM | POA: Diagnosis not present

## 2020-07-24 DIAGNOSIS — R0782 Intercostal pain: Secondary | ICD-10-CM

## 2020-07-24 DIAGNOSIS — R0789 Other chest pain: Secondary | ICD-10-CM

## 2020-07-24 NOTE — Progress Notes (Signed)
[address]       [place], [address]             [phone]                    Danikah H Scherman [place] Medical Record #[id number] Date of Birth: 07/12/1935  Referring: Maryland Pink, MD Primary Care: Maryland Pink, MD Primary Cardiologist: No primary care provider on file.  Chief Complaint:    Chief Complaint  Patient presents with  . Chest wall pain    Surgical consult, Chest CT 02/23/20,CXR 2/9/22[facility]  . Hiatal Hernia    History of Present Illness:    LELAINA OATIS 85 y.o. female presents for surgical evaluation of of sternal fracture that occurred in October of last year.  I originally thought that she was coming in to discuss management for hiatal hernia, but she has been advised not to undergo any surgical repair for this given her age.  Regards to the sternal fracture this occurred after a ground-level fall in October of last year, and over the last several weeks it has become increasingly painful whenever she coughs and sneezes.  She has also noticed a protrusion and the anterior portion of her chest.  In regards to the hiatal hernia she does have significant reflux, and also has significant shortness of breath for which she is seeing a pulmonologist for.  She denies any dysphagia or odontophagia.  She denies any chronic coughs or concerns for aspiration.    Past Medical History:  Diagnosis Date  . Anemia   . Back injury   . Bilateral breast cancer ([place]) 05/2015   Chemo tx's.  . Breast cancer ([place]) 2017  . Breast cancer in situ 05/2015   Left  . Edema of leg   . GERD (gastroesophageal reflux disease)   . Gonalgia   . Hiatal hernia   . Personal history of chemotherapy   . Personal history of radiation therapy   . Seasonal allergic rhinitis   . Sleep apnea   . Squamous cell skin cancer 2011   resected from left side of nose and Right axilla area.   . Tearing eyes    SINCE TAKING CHEMO    Past Surgical History:   Procedure Laterality Date  . APPENDECTOMY    . AXILLARY SENTINEL NODE BIOPSY Bilateral 12/10/2015   Procedure: BILATERAL SENTINEL NODE BIOPSY, SENTINNEL NODE INJ.;  Surgeon: [name], MD;  Location: [facility] ORS;  Service: General;  Laterality: Bilateral;  . BREAST BIOPSY Bilateral 11/17/2015   Procedure: BREAST BIOPSY WITH NEEDLE LOCALIZATION;  Surgeon: [name], MD;  Location: [facility] ORS;  Service: General;  Laterality: Bilateral;  . BREAST LUMPECTOMY Bilateral 05/2015   01/17 chemo before radiation after  . CATARACT EXTRACTION, BILATERAL Bilateral 2012  . COLONOSCOPY    . PORTACATH PLACEMENT Right 06/10/2015   Procedure: INSERTION PORT-A-CATH;  Surgeon: [name], MD;  Location: [facility] ORS;  Service: General;  Laterality: Right;    Family History  Problem Relation Age of Onset  . Dementia Mother   . Hyperlipidemia Mother   . Hypertension Mother   . Congestive Heart Failure Mother   . Heart disease Mother   . Aneurysm Father        abdominal  . Fibromyalgia Sister   . Heart disease Sister   . Cancer Maternal Uncle        brain  . Heart disease Maternal Uncle   .  Pancreatic cancer Maternal Grandmother   . Cancer Cousin        female cousin with breast cancer and brain cancer  . Ovarian cancer Maternal Aunt   . Colon cancer Maternal Aunt   . Breast cancer Neg Hx      Social History   Tobacco Use  Smoking Status Never Smoker  Smokeless Tobacco Never Used    Social History   Substance and Sexual Activity  Alcohol Use Not Currently  . Alcohol/week: 0.0 standard drinks   Comment: 3 glasses wine/week     Allergies  Allergen Reactions  . Penicillins Rash    Has patient had a PCN reaction causing immediate rash, facial/tongue/throat swelling, SOB or lightheadedness with hypotension: unsure Has patient had a PCN reaction causing severe rash involving mucus membranes or skin necrosis: no Has patient had a PCN reaction that required hospitalization no Has  patient had a PCN reaction occurring within the last 10 years: no If all of the above answers are "NO", then may proceed with Cephalosporin use.    Current Outpatient Medications  Medication Sig Dispense Refill  . acetaminophen (TYLENOL) 500 MG tablet Take 500 mg by mouth every 6 (six) hours as needed for mild pain.    Marland Kitchen azelastine (ASTELIN) 0.1 % nasal spray Place 1 spray into both nostrils 2 (two) times daily.     . calcium-vitamin D (OSCAL WITH D) 500-200 MG-UNIT tablet Take 2 tablets by mouth daily with breakfast.     . letrozole (FEMARA) 2.5 MG tablet TAKE 1 TABLET BY MOUTH DAILY 90 tablet 3  . losartan (COZAAR) 50 MG tablet Take 50 mg by mouth daily.    . Multiple Vitamins-Minerals (CENTRUM SILVER PO) Take 1 tablet by mouth daily. Reported on 06/15/2015    . omeprazole (PRILOSEC) 20 MG capsule Take 20 mg by mouth daily.     . valACYclovir (VALTREX) 500 MG tablet Take 500 mg by mouth daily as needed (break outs).     Marland Kitchen alendronate (FOSAMAX) 70 MG tablet TAKE 1 TABLET EVERY 7 DAYS WITH A FULL GLASS OF WATER ON AN EMPTY STOMACH DO NOT LIE DOWN FOR AT LEAST 30 MIN (Patient not taking: Reported on 07/24/2020) 4 tablet 11  . oxyCODONE-acetaminophen (PERCOCET) 5-325 MG tablet Take 1 tablet by mouth every 6 (six) hours as needed for severe pain. (Patient not taking: Reported on 07/24/2020) 20 tablet 0   No current facility-administered medications for this visit.    Review of Systems  Respiratory: Positive for shortness of breath.   Cardiovascular: Positive for chest pain.  Gastrointestinal: Positive for heartburn. Negative for abdominal pain, nausea and vomiting.    PHYSICAL EXAMINATION: BP (!) 170/96   Pulse 92   Resp 20   Ht 5\' 6"  (1.676 m)   Wt 174 lb (78.9 kg)   SpO2 96% Comment: RA  BMI 28.08 kg/m   Physical Exam Constitutional:      General: She is not in acute distress.    Appearance: Normal appearance. She is normal weight. She is not ill-appearing.  Pulmonary:      Effort: Pulmonary effort is normal. No respiratory distress.  Chest:       Comments: Nontender protrusion over the body of her sternum. Abdominal:     General: Abdomen is flat. There is no distension.  Musculoskeletal:     Cervical back: Normal range of motion.  Neurological:     General: No focal deficit present.     Mental Status: She is alert  and oriented to person, place, and time.  Psychiatric:        Mood and Affect: Mood normal.      Diagnostic Studies & Laboratory data:     Recent Radiology Findings:   CT CHEST WO CONTRAST  Result Date: 07/24/2020 CLINICAL DATA:  Sternal pain.  Fall in October of 2021 EXAM: CT CHEST WITHOUT CONTRAST TECHNIQUE: Multidetector CT imaging of the chest was performed following the standard protocol without IV contrast. COMPARISON:  02/23/2020 FINDINGS: Cardiovascular: Heart size is within normal limits. No pericardial effusion. Thoracic aorta is nonaneurysmal. Atherosclerotic calcifications of the aorta and coronary arteries. Main pulmonary trunk is nondilated. Mediastinum/Nodes: No axillary, hilar, or mediastinal lymphadenopathy. No mediastinal fluid collection or hematoma. Thyroid and trachea within normal limits. Large hiatal hernia with near-complete intrathoracic position of the stomach. Stomach is not dilated. Lungs/Pleura: Chronic compressive scarring or atelectasis adjacent to the hiatal hernia within the right lower lobe. Scattered ground-glass attenuation within the periphery of the right lower lobe and lingula. Lungs are otherwise clear. No pleural effusion or pneumothorax. Upper Abdomen: No acute findings. Musculoskeletal: Subacute to chronic appearing fracture of the mid sternal body with anterior displacement and slight anterior apex angulation. Sclerosis and callus formation traverse the fracture site. No acute or healing rib fracture. Exaggerated thoracic kyphosis. IMPRESSION: 1. Subacute-to-chronic appearing fracture of the mid sternal body  with anterior displacement and slight anterior apex angulation. Sclerosis and callus formation traverse the fracture site. 2. Scattered ground-glass attenuation within the periphery of the right lower lobe and lingula, which may represent atelectasis versus atypical or viral infection. 3. Large hiatal hernia with near-complete intrathoracic position of the stomach. 4. Aortic and coronary artery atherosclerosis (ICD10-I70.0). Electronically Signed   By: Davina Poke D.O.   On: 07/24/2020 14:17   DG Bone Density  Result Date: 07/21/2020 EXAM: DUAL X-RAY ABSORPTIOMETRY (DXA) FOR BONE MINERAL DENSITY IMPRESSION: Your patient Adlai Nieblas completed a BMD test on 07/21/2020 using the [place] (software version: 14.10) manufactured by [employer]. The following summarizes the results of our evaluation. Technologist: ecj PATIENT BIOGRAPHICAL: Name: Eliannah, Hinde Patient ID: [id number] Birth Date: [birth year] Height: 66.0 in. Gender: Female Exam Date: 07/21/2020 Weight: 173.0 lbs. Indications: History of Osteoporosis, Previous Chemo and Radiation, Caucasian, Height Loss, Parent Hip Fracture, High Risk Meds, Advanced Age, Breast CA, Postmenopausal, History of Fracture (Adult) Fractures: coccyx, Right foot, Sternum. Treatments: Fosamax, Omeprazole, calcium w/ vit D, Letrozole, Multi-Vitamin with calcium DENSITOMETRY RESULTS: Site      Region     Measured Date Measured Age WHO Classification Young Adult T-score BMD         %Change vs. Previous Significant Change (*) AP Spine L1-L3 07/21/[age] 83.1 Oste[age] -2.3 0.901 g/cm2 -1.4% - AP Spine L1-L3 07/17/2019 82.1 Osteopenia -2.2 0.914 g/cm2 1.4% - AP Spine L1-L3 07/16/2018 81.1 Osteopenia -2.3 0.901 g/cm2 2.0% - AP Spine L1-L3 07/13/2017 80.1 Osteopenia -2.4 0.883 g/cm2 6.4% Yes AP Spine L1-L3 06/01/2016 79.0 Osteoporosis -2.9 0.830 g/cm2 - - DualFemur Neck Right 07/21/2020 83.1 Osteopenia -2.0 0.762 g/cm2 -0.7% - DualFemur Neck Right  07/17/2019 82.1 Osteopenia -1.9 0.767 g/cm2 -1.0% - DualFemur Neck Right 07/16/2018 81.1 Osteopenia -1.9 0.775 g/cm2 -1.9% - DualFemur Neck Right 07/13/2017 80.1 Osteopenia -1.8 0.790 g/cm2 4.8% - DualFemur Neck Right 06/01/2016 79.0 Osteopenia -2.0 0.754 g/cm2 - - DualFemur Total Mean 07/21/2020 83.1 Osteopenia -1.6 0.803 g/cm2 2.7% Yes DualFemur Total Mean 07/17/2019 82.1 Osteopenia -1.8 0.782 g/cm2 0.8% - DualFemur Total Mean 07/16/2018 81.1 Osteopenia -  1.8 0.776 g/cm2 1.3% - DualFemur Total Mean 07/13/2017 80.1 Osteopenia -1.9 0.766 g/cm2 6.2% Yes DualFemur Total Mean 06/01/2016 79.0 Osteopenia -2.3 0.721 g/cm2 - - ASSESSMENT: The BMD measured at AP Spine L1-L3 is 0.901 g/cm2 with a T-score of -2.3. This patient is considered OSTEOPENIC according to [facility]) criteria. Compared with prior study, there has been no significant change in the spine. Compared with prior study, there has been no significant change in the total hip. The scan quality is good. L-4 was excluded due to degenerative changes. [facility]) criteria for post-menopausal, Caucasian Women: Normal:                   T-score at or above -1 SD Osteopenia/low bone mass: T-score between -1 and -2.5 SD Osteoporosis:             T-score at or below -2.5 SD RECOMMENDATIONS: 1. All patients should optimize calcium and vitamin D intake. 2. Consider FDA-approved medical therapies in postmenopausal women and men aged 36 years and older, based on the following: a. A hip or vertebral(clinical or morphometric) fracture b. T-score < -2.5 at the femoral neck or spine after appropriate evaluation to exclude secondary causes c. Low bone mass (T-score between -1.0 and -2.5 at the femoral neck or spine) and a 10-year probability of a hip fracture > 3% or a 10-year probability of a major osteoporosis-related fracture > 20% based on the [place]-adapted WHO algorithm 3. Clinician judgment and/or patient preferences may indicate  treatment for people with 10-year fracture probabilities above or below these levels FOLLOW-UP: People with diagnosed cases of osteoporosis or at high risk for fracture should have regular bone mineral density tests. For patients eligible for Medicare, routine testing is allowed once every 2 years. The testing frequency can be increased to one year for patients who have rapidly progressing disease, those who are receiving or discontinuing medical therapy to restore bone mass, or have additional risk factors. I have reviewed this report, and agree with the above findings. Mark A. Thornton Papas, M.D. [facility] Electronically Signed   By: Lavonia Dana M.D.   On: 07/21/2020 14:43       I have independently reviewed the above radiology studies  and reviewed the findings with the patient.   Recent Lab Findings: Lab Results  Component Value Date   WBC 4.2 01/17/2018   HGB 14.2 01/17/2018   HCT 41.2 01/17/2018   PLT 185 01/17/2018   GLUCOSE 101 (H) 01/09/2017   ALT 18 01/09/2017   AST 22 01/09/2017   NA 136 01/09/2017   K 3.9 01/09/2017   CL 105 01/09/2017   CREATININE 0.66 01/09/2017   BUN 20 01/09/2017   CO2 24 01/09/2017   INR 1.0 05/06/2011       Problem List: Displaced sternal fracture. Large paraesophageal hernia. Shortness of breath.  Assessment / Plan:   This is an 85 year old female with a displaced sternal fracture.  I reviewed her cross-sectional imaging from October 2021.  At the time she only had a hairline fracture at the body of her sternum.  I obtained a new CT scan today and there definitely is a displaced fracture in the same area where she had the original hairline fracture.  This is likely related to her osteoporosis.  After her CT scan she did not return to clinic and has not answered her phone so we have been unable to discuss plans, but on my view she would require  sternal reconstruction.  Once she answers her phone we will discuss this in further  detail.  Regards to the paraesophageal hernia it is quite large and I think it is contributing significantly to her shortness of breath.  She has not had any abdominal operations and says she would be a candidate for a robotic assisted paraesophageal hernia which even at her advanced age low risk.  She had several questions about this but given that her main reason for coming in clinic was her sternal fracture we focused most of our attention on this.      Lajuana Matte 07/24/2020 2:22 PM

## 2020-07-29 ENCOUNTER — Encounter: Payer: Self-pay | Admitting: Family Medicine

## 2020-07-29 ENCOUNTER — Encounter: Payer: Self-pay | Admitting: Thoracic Surgery (Cardiothoracic Vascular Surgery)

## 2020-07-29 NOTE — Progress Notes (Signed)
     [address]       [place] [address]             [phone]        Stephanie Crosby,  I have been trying to reach you about the results of your CT scan.  Please give my office a call at your convenience.  Kind Regards,  Lajuana Matte

## 2020-08-12 ENCOUNTER — Ambulatory Visit
Admission: RE | Admit: 2020-08-12 | Discharge: 2020-08-12 | Disposition: A | Discharge status: Home / Self Care | Payer: Medicare PPO | Source: Ambulatory Visit | Attending: Oncology | Admitting: Oncology

## 2020-08-12 ENCOUNTER — Other Ambulatory Visit: Payer: Self-pay

## 2020-08-12 DIAGNOSIS — C50812 Malignant neoplasm of overlapping sites of left female breast: Secondary | ICD-10-CM | POA: Insufficient documentation

## 2020-08-12 DIAGNOSIS — Z170 Estrogen receptor positive status [ER+]: Secondary | ICD-10-CM | POA: Diagnosis present

## 2020-08-21 NOTE — Progress Notes (Signed)
_ [place]  Telephone:([phone] [phone]  ID: [name] OB: 1935-11-23  MR#: [id number]  HKV#:[id number]  [name], MD as PCP - General (Family Medicine) [name], MD as Consulting Physician (General Surgery)   CHIEF COMPLAINT:   1. Clinical stage IIb ER positive, PR negative, HER-2 overexpressing invasive lobular carcinoma overlapping sites of the left breast, now pathologic stage Ia (T1c,N0,M0). 2. Clinical stage Ia ER/PR positive, HER-2 negative invasive ductal carcinoma of the upper outer quadrant of right breast, now pathologic stage 0.  INTERVAL HISTORY: Patient returns to clinic today for routine 54-monthevaluation.  She continues to have multiple medical complaints that are all chronic and unchanged including dyspnea on exertion, multiple skin lesions treated by dermatology, and vascular insufficiency.  She continues to tolerate letrozole well without significant side effects.  Her peripheral neuropathy is chronic and unchanged and does not affect her day-to-day activity. She has no other neurologic complaints. She denies any recent fevers or illnesses.  She denies any chest pain, cough, or hemoptysis.  She denies nausea, vomiting, diarrhea, or constipation. She has no urinary complaints.  Patient offers no further specific complaints today.  REVIEW OF SYSTEMS:   Review of Systems  Constitutional: Negative.  Negative for fever, malaise/fatigue and weight loss.  HENT: Negative for sore throat.   Eyes: Negative for discharge and redness.  Respiratory: Positive for shortness of breath. Negative for cough and hemoptysis.   Cardiovascular: Negative.  Negative for chest pain and leg swelling.  Gastrointestinal: Negative for abdominal pain, diarrhea, nausea and vomiting.  Genitourinary: Negative.  Negative for dysuria.  Musculoskeletal: Positive for joint pain.  Skin: Negative.  Negative for rash.  Neurological:  Positive for tingling and sensory change (Mild neuropathy). Negative for weakness and headaches.  Psychiatric/Behavioral: Negative.  The patient is not nervous/anxious and does not have insomnia.    As per HPI. Otherwise, a complete review of systems is negative.   PAST MEDICAL HISTORY: Past Medical History:  Diagnosis Date  . Anemia   . Back injury   . [place]   Chemo tx's.  . [place]  . Breast cancer in situ 05/2015   Left  . Edema of leg   . GERD (gastroesophageal reflux disease)   . Gonalgia   . Hiatal hernia   . Personal history of chemotherapy   . Personal history of radiation therapy   . Seasonal allergic rhinitis   . Sleep apnea   . Squamous cell skin cancer 2011   resected from left side of nose and Right axilla area.   . Tearing eyes    SINCE TAKING CHEMO    PAST SURGICAL HISTORY: Past Surgical History:  Procedure Laterality Date  . APPENDECTOMY    . AXILLARY SENTINEL NODE BIOPSY Bilateral 12/10/2015   Procedure: BILATERAL SENTINEL NODE BIOPSY, SENTINNEL NODE INJ.;  Surgeon: CClayburn Pert MD;  Location: [facility] ORS;  Service: General;  Laterality: Bilateral;  . BREAST BIOPSY Bilateral 11/17/2015   Procedure: BREAST BIOPSY WITH NEEDLE LOCALIZATION;  Surgeon: CClayburn Pert MD;  Location: [facility] ORS;  Service: General;  Laterality: Bilateral;  . BREAST LUMPECTOMY Bilateral 05/2015   01/17 chemo before radiation after  . CATARACT EXTRACTION, BILATERAL Bilateral 2012  . COLONOSCOPY    . PORTACATH PLACEMENT Right 06/10/2015   Procedure: INSERTION PORT-A-CATH;  Surgeon: CClayburn Pert MD;  Location: [facility] ORS;  Service: General;  Laterality: Right;    FAMILY HISTORY: Patient reports a  maternal aunt with breast cancer as well as a female cousin with breast cancer.  Family History  Problem Relation Age of Onset  . Dementia Mother   . Hyperlipidemia Mother   . Hypertension Mother   . Congestive Heart Failure Mother   .  Heart disease Mother   . Aneurysm Father        abdominal  . Fibromyalgia Sister   . Heart disease Sister   . Cancer Maternal Uncle        brain  . Heart disease Maternal Uncle   . Pancreatic cancer Maternal Grandmother   . Cancer Cousin        female cousin with breast cancer and brain cancer  . Ovarian cancer Maternal Aunt   . Colon cancer Maternal Aunt   . Breast cancer Neg Hx        ADVANCED DIRECTIVES:    HEALTH MAINTENANCE: Social History   Tobacco Use  . Smoking status: Never Smoker  . Smokeless tobacco: Never Used  Vaping Use  . Vaping Use: Never used  Substance Use Topics  . Alcohol use: Not Currently    Alcohol/week: 0.0 standard drinks    Comment: 3 glasses wine/week  . Drug use: No      Allergies  Allergen Reactions  . Penicillins Rash    Has patient had a PCN reaction causing immediate rash, facial/tongue/throat swelling, SOB or lightheadedness with hypotension: unsure Has patient had a PCN reaction causing severe rash involving mucus membranes or skin necrosis: no Has patient had a PCN reaction that required hospitalization no Has patient had a PCN reaction occurring within the last 10 years: no If all of the above answers are "NO", then may proceed with Cephalosporin use.    Current Outpatient Medications  Medication Sig Dispense Refill  . acetaminophen (TYLENOL) 500 MG tablet Take 500 mg by mouth every 6 (six) hours as needed for mild pain.    Marland Kitchen alendronate (FOSAMAX) 70 MG tablet TAKE 1 TABLET EVERY 7 DAYS WITH A FULL GLASS OF WATER ON AN EMPTY STOMACH DO NOT LIE DOWN FOR AT LEAST 30 MIN 4 tablet 11  . azelastine (ASTELIN) 0.1 % nasal spray Place 1 spray into both nostrils 2 (two) times daily.     . calcium-vitamin D (OSCAL WITH D) 500-200 MG-UNIT tablet Take 2 tablets by mouth daily with breakfast.     . letrozole (FEMARA) 2.5 MG tablet TAKE 1 TABLET BY MOUTH DAILY 90 tablet 3  . losartan (COZAAR) 50 MG tablet Take 50 mg by mouth daily.    .  Multiple Vitamins-Minerals (CENTRUM SILVER PO) Take 1 tablet by mouth daily. Reported on 06/15/2015    . omeprazole (PRILOSEC) 20 MG capsule Take 20 mg by mouth daily.     . valACYclovir (VALTREX) 500 MG tablet Take 500 mg by mouth daily as needed (break outs).      No current facility-administered medications for this visit.  1 1  OBJECTIVE: Vitals:   08/25/20 1121  BP: (!) 151/96  Pulse: 83  Resp: 17  Temp: 97.6 F (36.4 C)  SpO2: 97%     Body mass index is 27.7 kg/m.    ECOG FS:0 - Asymptomatic  General: Well-developed, well-nourished, no acute distress. Eyes: Pink conjunctiva, anicteric sclera. HEENT: Normocephalic, moist mucous membranes. Lungs: No audible wheezing or coughing. Heart: Regular rate and rhythm. Abdomen: Soft, nontender, no obvious distention. Musculoskeletal: No edema, cyanosis, or clubbing. Neuro: Alert, answering all questions appropriately. Cranial nerves grossly intact. Skin:  No rashes or petechiae noted. Psych: Normal affect.   LAB RESULTS:  Lab Results  Component Value Date   NA 136 01/09/2017   K 3.9 01/09/2017   CL 105 01/09/2017   CO2 24 01/09/2017   GLUCOSE 101 (H) 01/09/2017   BUN 20 01/09/2017   CREATININE 0.66 01/09/2017   CALCIUM 9.5 01/09/2017   PROT 6.9 01/09/2017   ALBUMIN 3.9 01/09/2017   AST 22 01/09/2017   ALT 18 01/09/2017   ALKPHOS 42 01/09/2017   BILITOT 1.6 (H) 01/09/2017   GFRNONAA >60 01/09/2017   GFRAA >60 01/09/2017    Lab Results  Component Value Date   WBC 4.2 01/17/2018   NEUTROABS 2.1 01/17/2018   HGB 14.2 01/17/2018   HCT 41.2 01/17/2018   MCV 92.6 01/17/2018   PLT 185 01/17/2018   Lab Results  Component Value Date   IRON 122 03/14/2016   TIBC 298 03/14/2016   IRONPCTSAT 41 (H) 03/14/2016   Lab Results  Component Value Date   FERRITIN 61 03/14/2016     STUDIES: MM DIAG BREAST TOMO BILATERAL  Result Date: 08/12/2020 CLINICAL DATA:  History of bilateral breast cancer status post lumpectomies  in 2017. EXAM: DIGITAL DIAGNOSTIC BILATERAL MAMMOGRAM WITH TOMOSYNTHESIS AND CAD TECHNIQUE: Bilateral digital diagnostic mammography and breast tomosynthesis was performed. The images were evaluated with computer-aided detection. COMPARISON:  Previous exam(s). ACR Breast Density Category b: There are scattered areas of fibroglandular density. FINDINGS: Stable lumpectomy changes are seen in both breast. No suspicious mass or malignant type microcalcifications identified in either breast. IMPRESSION: No evidence of malignancy in either breast. RECOMMENDATION: Bilateral diagnostic mammogram in 1 year is recommended. Per protocol, as the patient is now 2 or more years status post lumpectomy, she may return to annual screening mammography in 1 year. However, given the history of breast cancer, the patient remains eligible for annual diagnostic mammography if preferred. I have discussed the findings and recommendations with the patient. If applicable, a reminder letter will be sent to the patient regarding the next appointment. BI-RADS CATEGORY  2: Benign. Electronically Signed   By: Lillia Mountain M.D.   On: 08/12/2020 15:15    ASSESSMENT:   1. Clinical stage IIb ER positive, PR negative, HER-2 overexpressing invasive lobular carcinoma overlapping sites of the left breast, now pathologic stage Ia (T1c,N0,M0). 2. Clinical stage Ia ER/PR positive, HER-2 negative invasive ductal carcinoma of the upper outer quadrant of right breast, now pathologic stage 0. 3. BRCA 1 and 2 negative.  PLAN:    1. Clinical stage IIb ER positive, PR negative, HER-2 overexpressing invasive lobular carcinoma overlapping sites of the left breast, pathologic stage Ia (T1c,N0,M0): Patient noted to have residual stage IA tumor and her left breast with a complete pathologic response in her right breast. Patient completed 6 cycles of chemotherapy in June 2017 and year-long Herceptin maintenance on July 11, 2016.  Continue letrozole for total 5  years completing treatment in March 2023.  Her most recent mammogram on August 12, 2020 was reported as BI-RADS 2.  Repeat in April 2023.  Return to clinic in 6 months for routine evaluation.    2. Clinical stage Ia ER/PR positive, HER-2 negative invasive ductal carcinoma of the upper outer quadrant of right breast, pathologic stage 0: Complete pathologic response. Patient has completed XRT.  Letrozole mammogram as above. 3.  Shortness of breath/history of pulmonary nodule: Patient's most recent chest imaging on July 24, 2020 was chest CT without contrast revealed a fracture of the  sternum from her recent fall but no mention of additional pulmonary nodules other than scattered groundglass opacities.   4. Osteoporosis: Patient's most recent bone mineral density on July 21, 2020 revealed a T score of -2.3 which is essentially unchanged for several years.  Continue Fosamax, calcium, and vitamin D supplementation.  Repeat in April 2023 along with mammogram. 5.  Sternal pain: Patient recently had a fall with a fractured sternum.  Continue symptomatic treatment.  Patient expressed understanding and was in agreement with this plan. She also understands that She can call clinic at any time with any questions, concerns, or complaints.    Lloyd Huger, MD 08/26/20 6:11 AM

## 2020-08-25 ENCOUNTER — Encounter: Payer: Self-pay | Admitting: Oncology

## 2020-08-25 ENCOUNTER — Inpatient Hospital Stay: Payer: Medicare PPO | Attending: Oncology | Admitting: Oncology

## 2020-08-25 ENCOUNTER — Other Ambulatory Visit: Payer: Self-pay

## 2020-08-25 VITALS — BP 151/96 | HR 83 | Temp 97.6°F | Resp 17 | Ht 66.0 in | Wt 171.6 lb

## 2020-08-25 DIAGNOSIS — Z923 Personal history of irradiation: Secondary | ICD-10-CM | POA: Insufficient documentation

## 2020-08-25 DIAGNOSIS — Z79899 Other long term (current) drug therapy: Secondary | ICD-10-CM | POA: Insufficient documentation

## 2020-08-25 DIAGNOSIS — Z8041 Family history of malignant neoplasm of ovary: Secondary | ICD-10-CM | POA: Insufficient documentation

## 2020-08-25 DIAGNOSIS — Z880 Allergy status to penicillin: Secondary | ICD-10-CM | POA: Insufficient documentation

## 2020-08-25 DIAGNOSIS — C50411 Malignant neoplasm of upper-outer quadrant of right female breast: Secondary | ICD-10-CM | POA: Insufficient documentation

## 2020-08-25 DIAGNOSIS — Z818 Family history of other mental and behavioral disorders: Secondary | ICD-10-CM | POA: Insufficient documentation

## 2020-08-25 DIAGNOSIS — Z8349 Family history of other endocrine, nutritional and metabolic diseases: Secondary | ICD-10-CM | POA: Diagnosis not present

## 2020-08-25 DIAGNOSIS — Z8269 Family history of other diseases of the musculoskeletal system and connective tissue: Secondary | ICD-10-CM | POA: Insufficient documentation

## 2020-08-25 DIAGNOSIS — Z800 Family history of malignant neoplasm of digestive organs: Secondary | ICD-10-CM | POA: Diagnosis not present

## 2020-08-25 DIAGNOSIS — R0602 Shortness of breath: Secondary | ICD-10-CM | POA: Insufficient documentation

## 2020-08-25 DIAGNOSIS — C50812 Malignant neoplasm of overlapping sites of left female breast: Secondary | ICD-10-CM | POA: Diagnosis not present

## 2020-08-25 DIAGNOSIS — R0609 Other forms of dyspnea: Secondary | ICD-10-CM | POA: Diagnosis not present

## 2020-08-25 DIAGNOSIS — M2550 Pain in unspecified joint: Secondary | ICD-10-CM | POA: Insufficient documentation

## 2020-08-25 DIAGNOSIS — Z8249 Family history of ischemic heart disease and other diseases of the circulatory system: Secondary | ICD-10-CM | POA: Insufficient documentation

## 2020-08-25 DIAGNOSIS — Z9049 Acquired absence of other specified parts of digestive tract: Secondary | ICD-10-CM | POA: Insufficient documentation

## 2020-08-25 DIAGNOSIS — G629 Polyneuropathy, unspecified: Secondary | ICD-10-CM | POA: Insufficient documentation

## 2020-08-25 DIAGNOSIS — Z9221 Personal history of antineoplastic chemotherapy: Secondary | ICD-10-CM | POA: Insufficient documentation

## 2020-08-25 DIAGNOSIS — Z803 Family history of malignant neoplasm of breast: Secondary | ICD-10-CM | POA: Insufficient documentation

## 2020-08-25 DIAGNOSIS — Z79811 Long term (current) use of aromatase inhibitors: Secondary | ICD-10-CM | POA: Diagnosis not present

## 2020-08-25 DIAGNOSIS — Z170 Estrogen receptor positive status [ER+]: Secondary | ICD-10-CM | POA: Insufficient documentation

## 2020-09-01 ENCOUNTER — Other Ambulatory Visit: Payer: Self-pay | Admitting: Oncology

## 2020-09-01 DIAGNOSIS — C50411 Malignant neoplasm of upper-outer quadrant of right female breast: Secondary | ICD-10-CM

## 2020-09-01 DIAGNOSIS — Z170 Estrogen receptor positive status [ER+]: Secondary | ICD-10-CM

## 2020-12-08 DIAGNOSIS — E119 Type 2 diabetes mellitus without complications: Secondary | ICD-10-CM | POA: Insufficient documentation

## 2021-02-01 ENCOUNTER — Other Ambulatory Visit: Payer: Self-pay | Admitting: Oncology

## 2021-02-22 ENCOUNTER — Other Ambulatory Visit: Payer: Self-pay

## 2021-02-22 ENCOUNTER — Inpatient Hospital Stay: Payer: Medicare PPO | Attending: Oncology | Admitting: Oncology

## 2021-02-22 VITALS — BP 151/97 | HR 82 | Temp 97.1°F | Resp 16 | Wt 165.0 lb

## 2021-02-22 DIAGNOSIS — Z170 Estrogen receptor positive status [ER+]: Secondary | ICD-10-CM | POA: Insufficient documentation

## 2021-02-22 DIAGNOSIS — M8580 Other specified disorders of bone density and structure, unspecified site: Secondary | ICD-10-CM | POA: Insufficient documentation

## 2021-02-22 DIAGNOSIS — Z79899 Other long term (current) drug therapy: Secondary | ICD-10-CM | POA: Diagnosis not present

## 2021-02-22 DIAGNOSIS — Z923 Personal history of irradiation: Secondary | ICD-10-CM | POA: Insufficient documentation

## 2021-02-22 DIAGNOSIS — C50411 Malignant neoplasm of upper-outer quadrant of right female breast: Secondary | ICD-10-CM | POA: Insufficient documentation

## 2021-02-22 DIAGNOSIS — Z79811 Long term (current) use of aromatase inhibitors: Secondary | ICD-10-CM | POA: Diagnosis not present

## 2021-02-22 DIAGNOSIS — C50812 Malignant neoplasm of overlapping sites of left female breast: Secondary | ICD-10-CM | POA: Diagnosis not present

## 2021-02-22 NOTE — Progress Notes (Signed)
_ [place]  Telephone:([phone] [phone]  ID: [name] OB: 09-25-1935  MR#: [id number]  GMW#:[id number]  [name], MD as PCP - General (Family Medicine) [name], MD as Consulting Physician (General Surgery)   CHIEF COMPLAINT: 1. Clinical stage IIb ER positive, PR negative, HER-2 overexpressing invasive lobular carcinoma overlapping sites of the left breast, now pathologic stage Ia (T1c,N0,M0). 2. Clinical stage Ia ER/PR positive, HER-2 negative invasive ductal carcinoma of the upper outer quadrant of right breast, now pathologic stage 0.  INTERVAL HISTORY: Patient presents to clinic for routine follow-up for breast cancer.  She is on letrozole and Fosamax with good toleration.  Denies any unwanted side effects.  She has stable chronic joint pain and receives injections every few months.  She sees her PCP Dr. Kary Kos every few months and recently has been diagnosed with prediabetes.  She is currently working on appetite and increasing her activity to avoid starting any medication.  She continues to have sternal pain secondary to fall back in October 2021.  States she did fracture her sternum but unfortunately was not a great candidate for repair due to age and recovery time.  She has been seen by a Psychologist, sport and exercise.   She has chronic shortness of breath due to a large hiatal hernia that cannot be repaired due to age and comorbidities.  This is stable.  REVIEW OF SYSTEMS:   Review of Systems  Constitutional:  Positive for malaise/fatigue. Negative for chills, fever and weight loss.  HENT:  Negative for congestion, ear pain and tinnitus.   Eyes: Negative.  Negative for blurred vision and double vision.  Respiratory:  Positive for shortness of breath. Negative for cough and sputum production.   Cardiovascular: Negative.  Negative for chest pain, palpitations and leg swelling.  Gastrointestinal: Negative.  Negative for  abdominal pain, constipation, diarrhea, nausea and vomiting.  Genitourinary:  Negative for dysuria, frequency and urgency.  Musculoskeletal:  Positive for joint pain. Negative for back pain and falls.  Skin: Negative.  Negative for rash.  Neurological: Negative.  Negative for weakness and headaches.  Endo/Heme/Allergies: Negative.  Does not bruise/bleed easily.  Psychiatric/Behavioral: Negative.  Negative for depression. The patient is not nervous/anxious and does not have insomnia.   As per HPI. Otherwise, a complete review of systems is negative.   PAST MEDICAL HISTORY: Past Medical History:  Diagnosis Date   Anemia    Back injury    Bilateral breast cancer ([place]) 05/2015   Chemo tx's.   Breast cancer ([place]) 2017   Breast cancer in situ 05/2015   Left   Edema of leg    GERD (gastroesophageal reflux disease)    Gonalgia    Hiatal hernia    Personal history of chemotherapy    Personal history of radiation therapy    Seasonal allergic rhinitis    Sleep apnea    Squamous cell skin cancer 2011   resected from left side of nose and Right axilla area.    Tearing eyes    SINCE TAKING CHEMO    PAST SURGICAL HISTORY: Past Surgical History:  Procedure Laterality Date   APPENDECTOMY     AXILLARY SENTINEL NODE BIOPSY Bilateral 12/10/2015   Procedure: BILATERAL SENTINEL NODE BIOPSY, SENTINNEL NODE INJ.;  Surgeon: [name], MD;  Location: [facility] ORS;  Service: General;  Laterality: Bilateral;   BREAST BIOPSY Bilateral 11/17/2015   Procedure: BREAST BIOPSY WITH NEEDLE LOCALIZATION;  Surgeon: [name], MD;  Location: [facility] ORS;  Service: General;  Laterality: Bilateral;   BREAST LUMPECTOMY Bilateral 05/2015   01/17 chemo before radiation after   CATARACT EXTRACTION, BILATERAL Bilateral 2012   COLONOSCOPY     PORTACATH PLACEMENT Right 06/10/2015   Procedure: INSERTION PORT-A-CATH;  Surgeon: [name], MD;  Location: [facility] ORS;  Service: General;  Laterality: Right;     FAMILY HISTORY: Patient reports a maternal aunt with breast cancer as well as a female cousin with breast cancer.  Family History  Problem Relation Age of Onset   Dementia Mother    Hyperlipidemia Mother    Hypertension Mother    Congestive Heart Failure Mother    Heart disease Mother    Aneurysm Father        abdominal   Fibromyalgia Sister    Heart disease Sister    Cancer Maternal Uncle        brain   Heart disease Maternal Uncle    Pancreatic cancer Maternal Grandmother    Cancer Cousin        female cousin with breast cancer and brain cancer   Ovarian cancer Maternal Aunt    Colon cancer Maternal Aunt    Breast cancer Neg Hx        ADVANCED DIRECTIVES:    HEALTH MAINTENANCE: Social History   Tobacco Use   Smoking status: Never   Smokeless tobacco: Never  Vaping Use   Vaping Use: Never used  Substance Use Topics   Alcohol use: Not Currently    Alcohol/week: 0.0 standard drinks    Comment: 3 glasses wine/week   Drug use: No      Allergies  Allergen Reactions   Penicillins Rash    Has patient had a PCN reaction causing immediate rash, facial/tongue/throat swelling, SOB or lightheadedness with hypotension: unsure Has patient had a PCN reaction causing severe rash involving mucus membranes or skin necrosis: no Has patient had a PCN reaction that required hospitalization no Has patient had a PCN reaction occurring within the last 10 years: no If all of the above answers are "NO", then may proceed with Cephalosporin use.    Current Outpatient Medications  Medication Sig Dispense Refill   acetaminophen (TYLENOL) 500 MG tablet Take 500 mg by mouth every 6 (six) hours as needed for mild pain.     alendronate (FOSAMAX) 70 MG tablet TAKE 1 TABLET EVERY 7 DAYS WITH A FULL GLASS OF WATER ON AN EMPTY STOMACH DO NOT LIE DOWN FOR AT LEAST 30 MIN 4 tablet 11   azelastine (ASTELIN) 0.1 % nasal spray Place 1 spray into both nostrils 2 (two) times daily.       calcium-vitamin D (OSCAL WITH D) 500-200 MG-UNIT tablet Take 2 tablets by mouth daily with breakfast.      fluorometholone (FML) 0.1 % ophthalmic suspension SMARTSIG:In Eye(s)     letrozole (FEMARA) 2.5 MG tablet TAKE 1 TABLET BY MOUTH DAILY 90 tablet 1   losartan (COZAAR) 50 MG tablet Take 1 tablet by mouth daily.     Multiple Vitamins-Minerals (CENTRUM SILVER PO) Take 1 tablet by mouth daily. Reported on 06/15/2015     omeprazole (PRILOSEC) 20 MG capsule Take 20 mg by mouth daily.      valACYclovir (VALTREX) 500 MG tablet Take 500 mg by mouth daily as needed (break outs).      No current facility-administered medications for this visit.  1 1  OBJECTIVE: There were no vitals filed for this visit.    There is no height or weight on  file to calculate BMI.    ECOG FS:0 - Asymptomatic  Physical Exam Constitutional:      Appearance: Normal appearance.  HENT:     Head: Normocephalic and atraumatic.  Eyes:     Pupils: Pupils are equal, round, and reactive to light.  Cardiovascular:     Rate and Rhythm: Normal rate and regular rhythm.     Heart sounds: Normal heart sounds. No murmur heard. Pulmonary:     Effort: Pulmonary effort is normal.     Breath sounds: Normal breath sounds. No wheezing.  Abdominal:     General: Bowel sounds are normal. There is no distension.     Palpations: Abdomen is soft.     Tenderness: There is no abdominal tenderness.  Musculoskeletal:        General: Normal range of motion.     Cervical back: Normal range of motion.  Skin:    General: Skin is warm and dry.     Findings: No rash.  Neurological:     Mental Status: She is alert and oriented to person, place, and time.  Psychiatric:        Judgment: Judgment normal.     LAB RESULTS:  Lab Results  Component Value Date   NA 136 01/09/2017   K 3.9 01/09/2017   CL 105 01/09/2017   CO2 24 01/09/2017   GLUCOSE 101 (H) 01/09/2017   BUN 20 01/09/2017   CREATININE 0.66 01/09/2017   CALCIUM 9.5  01/09/2017   PROT 6.9 01/09/2017   ALBUMIN 3.9 01/09/2017   AST 22 01/09/2017   ALT 18 01/09/2017   ALKPHOS 42 01/09/2017   BILITOT 1.6 (H) 01/09/2017   GFRNONAA >60 01/09/2017   GFRAA >60 01/09/2017    Lab Results  Component Value Date   WBC 4.2 01/17/2018   NEUTROABS 2.1 01/17/2018   HGB 14.2 01/17/2018   HCT 41.2 01/17/2018   MCV 92.6 01/17/2018   PLT 185 01/17/2018   Lab Results  Component Value Date   IRON 122 03/14/2016   TIBC 298 03/14/2016   IRONPCTSAT 41 (H) 03/14/2016   Lab Results  Component Value Date   FERRITIN 61 03/14/2016     STUDIES: No results found.   ASSESSMENT: Mrs. Seger is an 85 year old female who presents for routine 69-monthfollow-up.  PLAN:    1.  Left breast cancer- She completed 6 cycles of chemotherapy in June 2017 and 1 year of Herceptin on 07/11/2016.  She is currently on letrozole and will complete treatment in March 2023.  Most recent mammogram is from August 12, 2020 and was reported as BI-RADS Category 2.  She will repeat in 2023.  Return to clinic after mammogram to see Dr. FGrayland Ormondto review results and discuss discontinuing letrozole.  2.  Shortness of breath-reports a large hiatal hernia that is certainly contributing.  She states she is a poor candidate for surgery based on comorbidities and age.  3.  Osteopenia-most recent bone survey showed T score of -2.3.  She is currently on Fosamax, calcium and vitamin D.  Denies any unwanted side effects.  Repeat bone density in April 2023 prior to her next visit.  4.  Sternal pain-secondary to fall back in October 2021.  She has been seen by a thoracic surgeon who does not recommend repair given her age and comorbidities.  5.  Osteoarthritis-mainly in her back and shoulders.  She is seen by Dr. MRudene Christiansand orthopedic.  She receives intermittent injections with some relief.  I  spent 25 minutes dedicated to the care of this patient (face-to-face and non-face-to-face) on the date of the  encounter to include what is described in the assessment and plan.  Patient expressed understanding and was in agreement with this plan. She also understands that She can call clinic at any time with any questions, concerns, or complaints.    Jacquelin Hawking, NP 02/22/21 10:33 AM

## 2021-02-22 NOTE — Progress Notes (Signed)
Pt reports having Covid the first week in August. Pt has no complaints/concerns at this time

## 2021-08-11 ENCOUNTER — Other Ambulatory Visit: Payer: Self-pay | Admitting: Oncology

## 2021-08-11 NOTE — Telephone Encounter (Signed)
PLAN:   ?  ?1.  Left breast cancer- She completed 6 cycles of chemotherapy in June 2017 and 1 year of Herceptin on 07/11/2016.  She is currently on letrozole and will complete treatment in March 2023.  Most recent mammogram is from August 12, 2020 and was reported as BI-RADS Category 2.  She will repeat in 2023.  Return to clinic after mammogram to see Dr. Grayland Ormond to review results and discuss discontinuing letrozole ?

## 2021-08-16 ENCOUNTER — Ambulatory Visit
Admission: RE | Admit: 2021-08-16 | Discharge: 2021-08-16 | Disposition: A | Discharge status: Home / Self Care | Payer: Medicare PPO | Source: Ambulatory Visit | Attending: Oncology | Admitting: Oncology

## 2021-08-16 DIAGNOSIS — Z1231 Encounter for screening mammogram for malignant neoplasm of breast: Secondary | ICD-10-CM | POA: Diagnosis not present

## 2021-08-16 DIAGNOSIS — Z13820 Encounter for screening for osteoporosis: Secondary | ICD-10-CM | POA: Diagnosis not present

## 2021-08-16 DIAGNOSIS — M8589 Other specified disorders of bone density and structure, multiple sites: Secondary | ICD-10-CM | POA: Diagnosis not present

## 2021-08-16 DIAGNOSIS — Z853 Personal history of malignant neoplasm of breast: Secondary | ICD-10-CM | POA: Diagnosis not present

## 2021-08-16 DIAGNOSIS — M8580 Other specified disorders of bone density and structure, unspecified site: Secondary | ICD-10-CM

## 2021-08-16 DIAGNOSIS — Z170 Estrogen receptor positive status [ER+]: Secondary | ICD-10-CM | POA: Insufficient documentation

## 2021-08-16 DIAGNOSIS — C50812 Malignant neoplasm of overlapping sites of left female breast: Secondary | ICD-10-CM

## 2021-08-16 NOTE — Progress Notes (Signed)
_ ?[facility] [place]  ?[phone] [phone] ? ?ID: Stephanie Crosby OB: 14-Apr-1936  MR#: [id number]  QZE#:[id number] ? ?Patient Care Team: ?[place] Pink, MD as PCP - General (Family Medicine) ?[name], MD as Consulting Physician (General Surgery) ? ? ?CHIEF COMPLAINT:  ? ?1. Clinical stage IIb ER positive, PR negative, HER-2 overexpressing invasive lobular carcinoma overlapping sites of the left breast, now pathologic stage Ia (T1c,N0,M0). ?2. Clinical stage Ia ER/PR positive, HER-2 negative invasive ductal carcinoma of the upper outer quadrant of right breast, now pathologic stage 0. ? ?INTERVAL HISTORY: Patient returns to clinic today for routine 34-monthevaluation.  She currently feels well and is asymptomatic.  She is tolerating letrozole and Fosamax without significant side effects. Her peripheral neuropathy is chronic and unchanged and does not affect her day-to-day activity. She has no other neurologic complaints. She denies any recent fevers or illnesses.  She denies any chest pain, shortness of breath, cough, or hemoptysis.  She denies nausea, vomiting, diarrhea, or constipation. She has no urinary complaints.  Patient offers no further specific complaints today. ? ?REVIEW OF SYSTEMS:   ?Review of Systems  ?Constitutional: Negative.  Negative for fever, malaise/fatigue and weight loss.  ?HENT:  Negative for sore throat.   ?Eyes:  Negative for discharge and redness.  ?Respiratory: Negative.  Negative for cough, hemoptysis and shortness of breath.   ?Cardiovascular: Negative.  Negative for chest pain and leg swelling.  ?Gastrointestinal:  Negative for abdominal pain, diarrhea, nausea and vomiting.  ?Genitourinary: Negative.  Negative for dysuria.  ?Musculoskeletal:  Positive for joint pain.  ?Skin: Negative.  Negative for rash.  ?Neurological:  Positive for tingling and sensory change (Mild neuropathy). Negative for weakness and headaches.  ?Psychiatric/Behavioral:  Negative.  The patient is not nervous/anxious and does not have insomnia.   ?As per HPI. Otherwise, a complete review of systems is negative. ? ? ?PAST MEDICAL HISTORY: ?Past Medical History:  ?Diagnosis Date  ? Anemia   ? Back injury   ? [place]  ? Chemo tx's.  ? [place]  ? Breast cancer in situ 05/2015  ? Left  ? Edema of leg   ? GERD (gastroesophageal reflux disease)   ? Gonalgia   ? Hiatal hernia   ? Personal history of chemotherapy   ? Personal history of radiation therapy   ? Seasonal allergic rhinitis   ? Sleep apnea   ? Squamous cell skin cancer 2011  ? resected from left side of nose and Right axilla area.   ? Tearing eyes   ? SINCE TAKING CHEMO  ? ? ?PAST SURGICAL HISTORY: ?Past Surgical History:  ?Procedure Laterality Date  ? APPENDECTOMY    ? AXILLARY SENTINEL NODE BIOPSY Bilateral 12/10/2015  ? Procedure: BILATERAL SENTINEL NODE BIOPSY, SENTINNEL NODE INJ.;  Surgeon: CClayburn Pert MD;  Location: [facility] ORS;  Service: General;  Laterality: Bilateral;  ? BREAST BIOPSY Bilateral 11/17/2015  ? Procedure: BREAST BIOPSY WITH NEEDLE LOCALIZATION;  Surgeon: CClayburn Pert MD;  Location: [facility] ORS;  Service: General;  Laterality: Bilateral;  ? BREAST LUMPECTOMY Bilateral 05/2015  ? 01/17 chemo before radiation after  ? CATARACT EXTRACTION, BILATERAL Bilateral 2012  ? COLONOSCOPY    ? PORTACATH PLACEMENT Right 06/10/2015  ? Procedure: INSERTION PORT-A-CATH;  Surgeon: CClayburn Pert MD;  Location: [facility] ORS;  Service: General;  Laterality: Right;  ? ? ?FAMILY HISTORY: Patient reports a maternal aunt with breast cancer as well as a female cousin with breast  cancer. ? ?Family History  ?Problem Relation Age of Onset  ? Dementia Mother   ? Hyperlipidemia Mother   ? Hypertension Mother   ? Congestive Heart Failure Mother   ? Heart disease Mother   ? Aneurysm Father   ?     abdominal  ? Fibromyalgia Sister   ? Heart disease Sister   ? Cancer Maternal Uncle   ?     brain  ?  Heart disease Maternal Uncle   ? Pancreatic cancer Maternal Grandmother   ? Cancer Cousin   ?     female cousin with breast cancer and brain cancer  ? Ovarian cancer Maternal Aunt   ? Colon cancer Maternal Aunt   ? Breast cancer Neg Hx   ? ? ?  ? ADVANCED DIRECTIVES:  ? ? ?HEALTH MAINTENANCE: ?Social History  ? ?Tobacco Use  ? Smoking status: Never  ? Smokeless tobacco: Never  ?Vaping Use  ? Vaping Use: Never used  ?Substance Use Topics  ? Alcohol use: Not Currently  ?  Alcohol/week: 0.0 standard drinks  ?  Comment: 3 glasses wine/week  ? Drug use: No  ? ? ? ? ?Allergies  ?Allergen Reactions  ? Penicillins Rash  ?  Has patient had a PCN reaction causing immediate rash, facial/tongue/throat swelling, SOB or lightheadedness with hypotension: unsure ?Has patient had a PCN reaction causing severe rash involving mucus membranes or skin necrosis: no ?Has patient had a PCN reaction that required hospitalization no ?Has patient had a PCN reaction occurring within the last 10 years: no ?If all of the above answers are "NO", then may proceed with Cephalosporin use.  ? ? ?Current Outpatient Medications  ?Medication Sig Dispense Refill  ? acetaminophen (TYLENOL) 500 MG tablet Take 500 mg by mouth every 6 (six) hours as needed for mild pain.    ? alendronate (FOSAMAX) 70 MG tablet TAKE 1 TABLET EVERY 7 DAYS WITH A FULL GLASS OF WATER ON AN EMPTY STOMACH DO NOT LIE DOWN FOR AT LEAST 30 MIN 4 tablet 11  ? azelastine (ASTELIN) 0.1 % nasal spray Place 1 spray into both nostrils 2 (two) times daily.     ? calcium-vitamin D (OSCAL WITH D) 500-200 MG-UNIT tablet Take 2 tablets by mouth daily with breakfast.     ? fluorometholone (FML) 0.1 % ophthalmic suspension SMARTSIG:In Eye(s)    ? letrozole (FEMARA) 2.5 MG tablet TAKE 1 TABLET BY MOUTH DAILY 90 tablet 1  ? losartan (COZAAR) 50 MG tablet Take 1 tablet by mouth daily.    ? Multiple Vitamins-Minerals (CENTRUM SILVER PO) Take 1 tablet by mouth daily. Reported on 06/15/2015    ? NON  FORMULARY Take 1 capsule by mouth daily. Otc vitamins for macular degeneration    ? omeprazole (PRILOSEC) 20 MG capsule Take 20 mg by mouth daily.     ? valACYclovir (VALTREX) 500 MG tablet Take 500 mg by mouth daily as needed (break outs).  (Patient not taking: Reported on 08/19/2021)    ? ?No current facility-administered medications for this visit.  ?1 1 ? ?OBJECTIVE: ?Vitals:  ? 08/19/21 1040  ?BP: 140/82  ?Pulse: 84  ?Resp: 17  ?Temp: (!) 97.5 ?F (36.4 ?C)  ?SpO2: 99%  ?   Body mass index is 27.2 kg/m?Marland Kitchen    ECOG FS:0 - Asymptomatic ? ?General: Well-developed, well-nourished, no acute distress. ?Eyes: Pink conjunctiva, anicteric sclera. ?HEENT: Normocephalic, moist mucous membranes. ?Breast: Exam deferred today. ?Lungs: No audible wheezing or coughing. ?Heart: Regular rate and  rhythm. ?Abdomen: Soft, nontender, no obvious distention. ?Musculoskeletal: No edema, cyanosis, or clubbing. ?Neuro: Alert, answering all questions appropriately. Cranial nerves grossly intact. ?Skin: No rashes or petechiae noted. ?Psych: Normal affect. ? ? ? ?LAB RESULTS: ? ?Lab Results  ?Component Value Date  ? NA 136 01/09/2017  ? K 3.9 01/09/2017  ? CL 105 01/09/2017  ? CO2 24 01/09/2017  ? GLUCOSE 101 (H) 01/09/2017  ? BUN 20 01/09/2017  ? CREATININE 0.66 01/09/2017  ? CALCIUM 9.5 01/09/2017  ? PROT 6.9 01/09/2017  ? ALBUMIN 3.9 01/09/2017  ? AST 22 01/09/2017  ? ALT 18 01/09/2017  ? ALKPHOS 42 01/09/2017  ? BILITOT 1.6 (H) 01/09/2017  ? GFRNONAA >60 01/09/2017  ? GFRAA >60 01/09/2017  ? ? ?Lab Results  ?Component Value Date  ? WBC 4.2 01/17/2018  ? NEUTROABS 2.1 01/17/2018  ? HGB 14.2 01/17/2018  ? HCT 41.2 01/17/2018  ? MCV 92.6 01/17/2018  ? PLT 185 01/17/2018  ? ?Lab Results  ?Component Value Date  ? IRON 122 03/14/2016  ? TIBC 298 03/14/2016  ? IRONPCTSAT 41 (H) 03/14/2016  ? ?Lab Results  ?Component Value Date  ? FERRITIN 61 03/14/2016  ? ? ? ?STUDIES: ?DG Bone Density ? ?Result Date: 08/16/2021 ?EXAM: DUAL X-RAY ABSORPTIOMETRY  (DXA) FOR BONE MINERAL DENSITY IMPRESSION: Your patient Aavya Shafer completed a BMD test on 08/16/2021 using the [place] (software version: 14.10) manufactured by [employer]. The f

## 2021-08-19 ENCOUNTER — Inpatient Hospital Stay: Payer: Medicare PPO | Attending: Oncology | Admitting: Oncology

## 2021-08-19 VITALS — BP 140/82 | HR 84 | Temp 97.5°F | Resp 17 | Ht 66.0 in | Wt 168.5 lb

## 2021-08-19 DIAGNOSIS — Z79811 Long term (current) use of aromatase inhibitors: Secondary | ICD-10-CM | POA: Diagnosis not present

## 2021-08-19 DIAGNOSIS — C50411 Malignant neoplasm of upper-outer quadrant of right female breast: Secondary | ICD-10-CM | POA: Insufficient documentation

## 2021-08-19 DIAGNOSIS — Z9221 Personal history of antineoplastic chemotherapy: Secondary | ICD-10-CM | POA: Diagnosis not present

## 2021-08-19 DIAGNOSIS — Z79899 Other long term (current) drug therapy: Secondary | ICD-10-CM | POA: Diagnosis not present

## 2021-08-19 DIAGNOSIS — M8580 Other specified disorders of bone density and structure, unspecified site: Secondary | ICD-10-CM

## 2021-08-19 DIAGNOSIS — Z170 Estrogen receptor positive status [ER+]: Secondary | ICD-10-CM | POA: Diagnosis not present

## 2021-08-19 DIAGNOSIS — C50812 Malignant neoplasm of overlapping sites of left female breast: Secondary | ICD-10-CM | POA: Diagnosis present

## 2021-08-19 DIAGNOSIS — Z923 Personal history of irradiation: Secondary | ICD-10-CM | POA: Diagnosis not present

## 2021-08-19 DIAGNOSIS — Z800 Family history of malignant neoplasm of digestive organs: Secondary | ICD-10-CM | POA: Diagnosis not present

## 2021-08-19 DIAGNOSIS — G629 Polyneuropathy, unspecified: Secondary | ICD-10-CM | POA: Insufficient documentation

## 2021-08-19 DIAGNOSIS — M810 Age-related osteoporosis without current pathological fracture: Secondary | ICD-10-CM | POA: Insufficient documentation

## 2021-12-02 ENCOUNTER — Ambulatory Visit: Payer: Medicare PPO | Admitting: Dermatology

## 2021-12-02 DIAGNOSIS — L570 Actinic keratosis: Secondary | ICD-10-CM

## 2021-12-02 DIAGNOSIS — L565 Disseminated superficial actinic porokeratosis (DSAP): Secondary | ICD-10-CM

## 2021-12-02 DIAGNOSIS — L820 Inflamed seborrheic keratosis: Secondary | ICD-10-CM

## 2021-12-02 DIAGNOSIS — Z85828 Personal history of other malignant neoplasm of skin: Secondary | ICD-10-CM

## 2021-12-02 DIAGNOSIS — L578 Other skin changes due to chronic exposure to nonionizing radiation: Secondary | ICD-10-CM

## 2021-12-02 NOTE — Patient Instructions (Addendum)
Cryotherapy Aftercare  Wash gently with soap and water everyday.   Apply Vaseline and Band-Aid daily until healed.     Due to recent changes in healthcare laws, you may see results of your pathology and/or laboratory studies on MyChart before the doctors have had a chance to review them. We understand that in some cases there may be results that are confusing or concerning to you. Please understand that not all results are received at the same time and often the doctors may need to interpret multiple results in order to provide you with the best plan of care or course of treatment. Therefore, we ask that you please give us 2 business days to thoroughly review all your results before contacting the office for clarification. Should we see a critical lab result, you will be contacted sooner.   If You Need Anything After Your Visit  If you have any questions or concerns for your doctor, please call our main line at [phone] and press option 4 to reach your doctor's medical assistant. If no one answers, please leave a voicemail as directed and we will return your call as soon as possible. Messages left after 4 pm will be answered the following business day.   You may also send us a message via MyChart. We typically respond to MyChart messages within 1-2 business days.  For prescription refills, please ask your pharmacy to contact our office. Our fax number is [phone].  If you have an urgent issue when the clinic is closed that cannot wait until the next business day, you can page your doctor at the number below.    Please note that while we do our best to be available for urgent issues outside of office hours, we are not available 24/7.   If you have an urgent issue and are unable to reach us, you may choose to seek medical care at your doctor's office, retail clinic, urgent care center, or emergency room.  If you have a medical emergency, please immediately call 911 or go to the  emergency department.  Pager Numbers  - Dr. Kowalski: [phone]  - Dr. Moye: [phone]  - Dr. Stewart: [phone]  In the event of inclement weather, please call our main line at [phone] for an update on the status of any delays or closures.  Dermatology Medication Tips: Please keep the boxes that topical medications come in in order to help keep track of the instructions about where and how to use these. Pharmacies typically print the medication instructions only on the boxes and not directly on the medication tubes.   If your medication is too expensive, please contact our office at [phone] option 4 or send us a message through MyChart.   We are unable to tell what your co-pay for medications will be in advance as this is different depending on your insurance coverage. However, we may be able to find a substitute medication at lower cost or fill out paperwork to get insurance to cover a needed medication.   If a prior authorization is required to get your medication covered by your insurance company, please allow us 1-2 business days to complete this process.  Drug prices often vary depending on where the prescription is filled and some pharmacies may offer cheaper prices.  The website www.goodrx.com contains coupons for medications through different pharmacies. The prices here do not account for what the cost may be with help from insurance (it may be cheaper with your insurance), but the website can   give you the price if you did not use any insurance.  - You can print the associated coupon and take it with your prescription to the pharmacy.  - You may also stop by our office during regular business hours and pick up a GoodRx coupon card.  - If you need your prescription sent electronically to a different pharmacy, notify our office through [facility] MyChart or by phone at [phone] option 4.     Si Usted Necesita Algo Despus de Su Visita  Tambin puede  enviarnos un mensaje a travs de MyChart. Por lo general respondemos a los mensajes de MyChart en el transcurso de 1 a 2 das hbiles.  Para renovar recetas, por favor pida a su farmacia que se ponga en contacto con nuestra oficina. Nuestro nmero de fax es el [phone].  Si tiene un asunto urgente cuando la clnica est cerrada y que no puede esperar hasta el siguiente da hbil, puede llamar/localizar a su doctor(a) al nmero que aparece a continuacin.   Por favor, tenga en cuenta que aunque hacemos todo lo posible para estar disponibles para asuntos urgentes fuera del horario de oficina, no estamos disponibles las 24 horas del da, los 7 das de la semana.   Si tiene un problema urgente y no puede comunicarse con nosotros, puede optar por buscar atencin mdica  en el consultorio de su doctor(a), en una clnica privada, en un centro de atencin urgente o en una sala de emergencias.  Si tiene una emergencia mdica, por favor llame inmediatamente al 911 o vaya a la sala de emergencias.  Nmeros de bper  - Dr. Kowalski: [phone]  - Dra. Moye: [phone]  - Dra. Stewart: [phone]  En caso de inclemencias del tiempo, por favor llame a nuestra lnea principal al [phone] para una actualizacin sobre el estado de cualquier retraso o cierre.  Consejos para la medicacin en dermatologa: Por favor, guarde las cajas en las que vienen los medicamentos de uso tpico para ayudarle a seguir las instrucciones sobre dnde y cmo usarlos. Las farmacias generalmente imprimen las instrucciones del medicamento slo en las cajas y no directamente en los tubos del medicamento.   Si su medicamento es muy caro, por favor, pngase en contacto con nuestra oficina llamando al [phone] y presione la opcin 4 o envenos un mensaje a travs de MyChart.   No podemos decirle cul ser su copago por los medicamentos por adelantado ya que esto es diferente dependiendo de la cobertura de su seguro.  Sin embargo, es posible que podamos encontrar un medicamento sustituto a menor costo o llenar un formulario para que el seguro cubra el medicamento que se considera necesario.   Si se requiere una autorizacin previa para que su compaa de seguros cubra su medicamento, por favor permtanos de 1 a 2 das hbiles para completar este proceso.  Los precios de los medicamentos varan con frecuencia dependiendo del lugar de dnde se surte la receta y alguna farmacias pueden ofrecer precios ms baratos.  El sitio web www.goodrx.com tiene cupones para medicamentos de diferentes farmacias. Los precios aqu no tienen en cuenta lo que podra costar con la ayuda del seguro (puede ser ms barato con su seguro), pero el sitio web puede darle el precio si no utiliz ningn seguro.  - Puede imprimir el cupn correspondiente y llevarlo con su receta a la farmacia.  - Tambin puede pasar por nuestra oficina durante el horario de atencin regular y recoger una tarjeta de cupones de GoodRx.  -   Si necesita que su receta se enve electrnicamente a una farmacia diferente, informe a nuestra oficina a travs de MyChart de [facility] o por telfono llamando al [phone] y presione la opcin 4.  

## 2021-12-02 NOTE — Progress Notes (Signed)
   New Patient Visit  Subjective  Stephanie Crosby is a 86 y.o. female who presents for the following: Other (Spots of arms and legs. She has been going to [facility] in [place] and he rdr has left the practice.She has a history of SCC of the left face that was treated in past by Dr. Nicole Kindred.). The patient has spots, moles and lesions to be evaluated, some may be new or changing and the patient has concerns that these could be cancer.  The following portions of the chart were reviewed this encounter and updated as appropriate:   Tobacco  Allergies  Meds  Problems  Med Hx  Surg Hx  Fam Hx     Review of Systems:  No other skin or systemic complaints except as noted in HPI or Assessment and Plan.  Objective  Well appearing patient in no apparent distress; mood and affect are within normal limits.  A focused examination was performed including arms, legs, face. Relevant physical exam findings are noted in the Assessment and Plan.  Arms and Legs   Arms and Legs (21) Erythematous thin papules/macules with gritty scale.   Left Lower Leg x 1, left cheek x 1 (2) Erythematous stuck-on, waxy papule or plaque   Assessment & Plan   History of SCC -Will request pathology results from [facility] and [facility]  DSAP (disseminated superficial actinic porokeratosis) Arms and Legs DSAP is a chronic inherited condition of sun-exposed skin, most commonly affecting the arms and legs.  It is difficult to treat.  Recommend photoprotection and regular use of spf 30 or higher sunscreen to prevent worsening of condition and precancerous changes.  AK (actinic keratosis) (21) Arms and Legs Destruction of lesion - Arms and Legs Complexity: simple   Destruction method: cryotherapy   Informed consent: discussed and consent obtained   Timeout:  patient name, date of birth, surgical site, and procedure verified Lesion destroyed using liquid nitrogen: Yes   Region frozen  until ice ball extended beyond lesion: Yes   Outcome: patient tolerated procedure well with no complications   Post-procedure details: wound care instructions given    Inflamed seborrheic keratosis Left Lower Leg x 1, left cheek x 1 Destruction of lesion - Left Lower Leg x 1, left cheek x 1 Complexity: simple   Destruction method: cryotherapy   Informed consent: discussed and consent obtained   Timeout:  patient name, date of birth, surgical site, and procedure verified Lesion destroyed using liquid nitrogen: Yes   Region frozen until ice ball extended beyond lesion: Yes   Outcome: patient tolerated procedure well with no complications   Post-procedure details: wound care instructions given    Actinic Damage - chronic, secondary to cumulative UV radiation exposure/sun exposure over time - diffuse scaly erythematous macules with underlying dyspigmentation - Recommend daily broad spectrum sunscreen SPF 30+ to sun-exposed areas, reapply every 2 hours as needed.  - Recommend staying in the shade or wearing long sleeves, sun glasses (UVA+UVB protection) and wide brim hats (4-inch brim around the entire circumference of the hat). - Call for new or changing lesions.  Return in about 3 months (around 03/04/2022) for AK follow up.  I, Ashok Cordia, CMA, am acting as scribe for Sarina Ser, MD . Documentation: I have reviewed the above documentation for accuracy and completeness, and I agree with the above.  Sarina Ser, MD

## 2021-12-04 ENCOUNTER — Encounter: Payer: Self-pay | Admitting: Dermatology

## 2022-02-28 ENCOUNTER — Encounter (INDEPENDENT_AMBULATORY_CARE_PROVIDER_SITE_OTHER): Payer: Self-pay

## 2022-03-03 ENCOUNTER — Ambulatory Visit: Payer: Medicare PPO | Admitting: Dermatology

## 2022-03-11 ENCOUNTER — Ambulatory Visit: Payer: Medicare PPO | Admitting: Dermatology

## 2022-03-11 DIAGNOSIS — L570 Actinic keratosis: Secondary | ICD-10-CM

## 2022-03-11 DIAGNOSIS — L821 Other seborrheic keratosis: Secondary | ICD-10-CM | POA: Diagnosis not present

## 2022-03-11 DIAGNOSIS — L565 Disseminated superficial actinic porokeratosis (DSAP): Secondary | ICD-10-CM | POA: Diagnosis not present

## 2022-03-11 DIAGNOSIS — Z5111 Encounter for antineoplastic chemotherapy: Secondary | ICD-10-CM | POA: Diagnosis not present

## 2022-03-11 DIAGNOSIS — Z79899 Other long term (current) drug therapy: Secondary | ICD-10-CM

## 2022-03-11 DIAGNOSIS — L578 Other skin changes due to chronic exposure to nonionizing radiation: Secondary | ICD-10-CM | POA: Diagnosis not present

## 2022-03-11 DIAGNOSIS — L820 Inflamed seborrheic keratosis: Secondary | ICD-10-CM | POA: Diagnosis not present

## 2022-03-11 NOTE — Patient Instructions (Signed)
Due to recent changes in healthcare laws, you may see results of your pathology and/or laboratory studies on MyChart before the doctors have had a chance to review them. We understand that in some cases there may be results that are confusing or concerning to you. Please understand that not all results are received at the same time and often the doctors may need to interpret multiple results in order to provide you with the best plan of care or course of treatment. Therefore, we ask that you please give us 2 business days to thoroughly review all your results before contacting the office for clarification. Should we see a critical lab result, you will be contacted sooner.   If You Need Anything After Your Visit  If you have any questions or concerns for your doctor, please call our main line at [phone] and press option 4 to reach your doctor's medical assistant. If no one answers, please leave a voicemail as directed and we will return your call as soon as possible. Messages left after 4 pm will be answered the following business day.   You may also send us a message via MyChart. We typically respond to MyChart messages within 1-2 business days.  For prescription refills, please ask your pharmacy to contact our office. Our fax number is [phone].  If you have an urgent issue when the clinic is closed that cannot wait until the next business day, you can page your doctor at the number below.    Please note that while we do our best to be available for urgent issues outside of office hours, we are not available 24/7.   If you have an urgent issue and are unable to reach us, you may choose to seek medical care at your doctor's office, retail clinic, urgent care center, or emergency room.  If you have a medical emergency, please immediately call 911 or go to the emergency department.  Pager Numbers  - Dr. Kowalski: [phone]  - Dr. Moye: [phone]  - Dr. Stewart:  [phone]  In the event of inclement weather, please call our main line at [phone] for an update on the status of any delays or closures.  Dermatology Medication Tips: Please keep the boxes that topical medications come in in order to help keep track of the instructions about where and how to use these. Pharmacies typically print the medication instructions only on the boxes and not directly on the medication tubes.   If your medication is too expensive, please contact our office at [phone] option 4 or send us a message through MyChart.   We are unable to tell what your co-pay for medications will be in advance as this is different depending on your insurance coverage. However, we may be able to find a substitute medication at lower cost or fill out paperwork to get insurance to cover a needed medication.   If a prior authorization is required to get your medication covered by your insurance company, please allow us 1-2 business days to complete this process.  Drug prices often vary depending on where the prescription is filled and some pharmacies may offer cheaper prices.  The website www.goodrx.com contains coupons for medications through different pharmacies. The prices here do not account for what the cost may be with help from insurance (it may be cheaper with your insurance), but the website can give you the price if you did not use any insurance.  - You can print the associated coupon and take it with   your prescription to the pharmacy.  - You may also stop by our office during regular business hours and pick up a GoodRx coupon card.  - If you need your prescription sent electronically to a different pharmacy, notify our office through [place] MyChart or by phone at [phone] option 4.     Si Usted Necesita Algo Despus de Su Visita  Tambin puede enviarnos un mensaje a travs de MyChart. Por lo general respondemos a los mensajes de MyChart en el transcurso de 1 a 2  das hbiles.  Para renovar recetas, por favor pida a su farmacia que se ponga en contacto con nuestra oficina. Nuestro nmero de fax es el [phone].  Si tiene un asunto urgente cuando la clnica est cerrada y que no puede esperar hasta el siguiente da hbil, puede llamar/localizar a su doctor(a) al nmero que aparece a continuacin.   Por favor, tenga en cuenta que aunque hacemos todo lo posible para estar disponibles para asuntos urgentes fuera del horario de oficina, no estamos disponibles las 24 horas del da, los 7 das de la semana.   Si tiene un problema urgente y no puede comunicarse con nosotros, puede optar por buscar atencin mdica  en el consultorio de su doctor(a), en una clnica privada, en un centro de atencin urgente o en una sala de emergencias.  Si tiene una emergencia mdica, por favor llame inmediatamente al 911 o vaya a la sala de emergencias.  Nmeros de bper  - Dr. Kowalski: [phone]  - Dra. Moye: [phone]  - Dra. Stewart: [phone]  En caso de inclemencias del tiempo, por favor llame a nuestra lnea principal al [phone] para una actualizacin sobre el estado de cualquier retraso o cierre.  Consejos para la medicacin en dermatologa: Por favor, guarde las cajas en las que vienen los medicamentos de uso tpico para ayudarle a seguir las instrucciones sobre dnde y cmo usarlos. Las farmacias generalmente imprimen las instrucciones del medicamento slo en las cajas y no directamente en los tubos del medicamento.   Si su medicamento es muy caro, por favor, pngase en contacto con nuestra oficina llamando al [phone] y presione la opcin 4 o envenos un mensaje a travs de MyChart.   No podemos decirle cul ser su copago por los medicamentos por adelantado ya que esto es diferente dependiendo de la cobertura de su seguro. Sin embargo, es posible que podamos encontrar un medicamento sustituto a menor costo o llenar un formulario para que el  seguro cubra el medicamento que se considera necesario.   Si se requiere una autorizacin previa para que su compaa de seguros cubra su medicamento, por favor permtanos de 1 a 2 das hbiles para completar este proceso.  Los precios de los medicamentos varan con frecuencia dependiendo del lugar de dnde se surte la receta y alguna farmacias pueden ofrecer precios ms baratos.  El sitio web www.goodrx.com tiene cupones para medicamentos de diferentes farmacias. Los precios aqu no tienen en cuenta lo que podra costar con la ayuda del seguro (puede ser ms barato con su seguro), pero el sitio web puede darle el precio si no utiliz ningn seguro.  - Puede imprimir el cupn correspondiente y llevarlo con su receta a la farmacia.  - Tambin puede pasar por nuestra oficina durante el horario de atencin regular y recoger una tarjeta de cupones de GoodRx.  - Si necesita que su receta se enve electrnicamente a una farmacia diferente, informe a nuestra oficina a travs de MyChart de [place]   o por telfono llamando al [phone] y presione la opcin 4.  

## 2022-03-11 NOTE — Progress Notes (Signed)
Follow-Up Visit   Subjective  Stephanie Crosby is a 86 y.o. female who presents for the following: Actinic Keratosis (Arms and legs - previously tx with LN2, patient here today to recheck skin for precancerous skin lesions). The patient has spots, moles and lesions to be evaluated, some may be new or changing and the patient has concerns that these could be cancer.  The following portions of the chart were reviewed this encounter and updated as appropriate:   Tobacco  Allergies  Meds  Problems  Med Hx  Surg Hx  Fam Hx     Review of Systems:  No other skin or systemic complaints except as noted in HPI or Assessment and Plan.  Objective  Well appearing patient in no apparent distress; mood and affect are within normal limits.  A focused examination was performed including the face, arms, and legs. Relevant physical exam findings are noted in the Assessment and Plan.  Arms, legs, hands x >20 (20) Erythematous thin papules/macules with gritty scale.   Arms x 5 Erythematous stuck-on, waxy papule or plaque   Assessment & Plan  AK (actinic keratosis) (20) Arms, legs, hands x >20 Destruction of lesion - Arms, legs, hands x >20 Complexity: simple   Destruction method: cryotherapy   Informed consent: discussed and consent obtained   Timeout:  patient name, date of birth, surgical site, and procedure verified Lesion destroyed using liquid nitrogen: Yes   Region frozen until ice ball extended beyond lesion: Yes   Outcome: patient tolerated procedure well with no complications   Post-procedure details: wound care instructions given    DSAP (disseminated superficial actinic porokeratosis) Arms and legs Will consider treatment with PDT/red light treatment on the arms and hands if covered by insurance.  Inflamed seborrheic keratosis Arms x 5 Symptomatic, irritating, patient would like treated. Destruction of lesion - Arms x 5 Complexity: simple   Destruction method:  cryotherapy   Informed consent: discussed and consent obtained   Timeout:  patient name, date of birth, surgical site, and procedure verified Lesion destroyed using liquid nitrogen: Yes   Region frozen until ice ball extended beyond lesion: Yes   Outcome: patient tolerated procedure well with no complications   Post-procedure details: wound care instructions given    Actinic Damage with PreCancerous Actinic Keratoses Counseling for Topical Chemotherapy Management: Patient exhibits: - Severe, confluent actinic changes with pre-cancerous actinic keratoses that is secondary to cumulative UV radiation exposure over time - Condition that is severe; chronic, not at goal. - diffuse scaly erythematous macules and papules with underlying dyspigmentation - Discussed Prescription "Field Treatment" topical Chemotherapy for Severe, Chronic Confluent Actinic Changes with Pre-Cancerous Actinic Keratoses Field treatment involves treatment of an entire area of skin that has confluent Actinic Changes (Sun/ Ultraviolet light damage) and PreCancerous Actinic Keratoses by method of PhotoDynamic Therapy (PDT) and/or prescription Topical Chemotherapy agents such as 5-fluorouracil, 5-fluorouracil/calcipotriene, and/or imiquimod.  The purpose is to decrease the number of clinically evident and subclinical PreCancerous lesions to prevent progression to development of skin cancer by chemically destroying early precancer changes that may or may not be visible.  It has been shown to reduce the risk of developing skin cancer in the treated area. As a result of treatment, redness, scaling, crusting, and open sores may occur during treatment course. One or more than one of these methods may be used and may have to be used several times to control, suppress and eliminate the PreCancerous changes. Discussed treatment course, expected reaction, and possible  side effects. - Recommend daily broad spectrum sunscreen SPF 30+ to  sun-exposed areas, reapply every 2 hours as needed.  - Staying in the shade or wearing long sleeves, sun glasses (UVA+UVB protection) and wide brim hats (4-inch brim around the entire circumference of the hat) are also recommended. - Call for new or changing lesions. - Patient to schedule red light treatment on the arms and hands.  Seborrheic Keratoses - Stuck-on, waxy, tan-brown papules and/or plaques  - Benign-appearing - Discussed benign etiology and prognosis. - Observe - Call for any changes  Return for 5-6 weeks red light PDT of the arms and hands, follow up with Dr. Raliegh Ip in 3 mths.  Luther Redo, CMA, am acting as scribe for Sarina Ser, MD . Documentation: I have reviewed the above documentation for accuracy and completeness, and I agree with the above.  Sarina Ser, MD

## 2022-03-19 ENCOUNTER — Encounter: Payer: Self-pay | Admitting: Dermatology

## 2022-03-29 ENCOUNTER — Telehealth: Payer: Self-pay

## 2022-03-29 NOTE — Telephone Encounter (Signed)
Dr. Nehemiah Massed wanted patient scheduled just for redlight on the hands. "Lets just start with hands only - both hands at one time - even if you just can put light on one hand at a time.  Then we will re-evaluate.  Make pt appt for about 6-12 weeks after her hands done. Please take photos the day of treatment. Thanks"      Called patient today to advise her of information per Dr. Nehemiah Massed and she has declined scheduling PDT treatments at this time.

## 2022-05-10 ENCOUNTER — Ambulatory Visit: Payer: Medicare PPO

## 2022-06-20 ENCOUNTER — Ambulatory Visit (INDEPENDENT_AMBULATORY_CARE_PROVIDER_SITE_OTHER): Payer: Medicare PPO | Admitting: Dermatology

## 2022-06-20 VITALS — BP 134/83 | HR 98

## 2022-06-20 DIAGNOSIS — L578 Other skin changes due to chronic exposure to nonionizing radiation: Secondary | ICD-10-CM | POA: Diagnosis not present

## 2022-06-20 DIAGNOSIS — Z7189 Other specified counseling: Secondary | ICD-10-CM | POA: Diagnosis not present

## 2022-06-20 DIAGNOSIS — L570 Actinic keratosis: Secondary | ICD-10-CM | POA: Diagnosis not present

## 2022-06-20 NOTE — Patient Instructions (Addendum)
Photodynamic Therapy/Red Light Therapy  Actinic keratoses are the dry, red scaly spots on the skin caused by sun damage. A portion of these spots can turn into skin cancer with time, and treating them can help prevent development of skin cancer.   Treatment of these spots requires removal of the defective skin cells. There are various ways to remove actinic keratoses, including freezing with liquid nitrogen, treatment with creams, or treatment with a blue light procedure in the office.   Photodynamic Therapy (PDT), also known as "Red light therapy" is an in office procedure used to treat actinic keratoses. It works by targeting precancerous cells. After treatment, these cells peel off and are replaced by healthy ones.   For your phototherapy appointment, you will have two appointments on the day of your treatment. The first appointment will be to apply a cream to the treatment area. You will leave this cream on for 1-2 hours depending on the area being treated. The second appointment will be to shine a blue light on the area for 16 minutes to kill off the precancer cells. It is common to experience a burning sensation during the treatment.  After your treatment, it will be important to keep the treated areas of skin out of the sun completely for 48-72 hours (2-3 days) to prevent having a reaction.   Common side effects include: - Burning or stinging, which may be severe and can last up to 24-72 hours after your treatment - Scaling and crusting which may last up to 2 weeks - Redness, swelling and/or peeling which can last up to 4 weeks  To Care for Your Skin After PDT/Red Light Therapy: - Wash with soap, water and shampoo as normal. - If needed, you can use cold compresses (e.g. ice packs) for comfort - If okay with your primary care doctor, you may use analgesics such as acetaminophen (tylenol) every 4-6 hours, not to exceed recommended dose - You may apply Cerave Healing Ointment, Vaseline or  Aquaphor as needed - If you have a lot of swelling you may take a Benadryl to help with this (this may cause drowsiness), not to exceed recommended dose. This may increase the risk of falls in people over 65 and may slow reaction time while driving, so it is not recommended to take before driving or operating machinery. - Sun Precautions - Wear a wide brim hat for the next week if outside  - Wear a sunblock with zinc or titanium dioxide at least SPF 50 daily  If you have any questions or concerns, please call the office and ask to speak with a nurse.   --------------------------------------------------------------------------------------------------------------   Cryotherapy Aftercare  Wash gently with soap and water everyday.   Apply Vaseline and Band-Aid daily until healed.     Actinic keratoses are precancerous spots that appear secondary to cumulative UV radiation exposure/sun exposure over time. They are chronic with expected duration over 1 year. A portion of actinic keratoses will progress to squamous cell carcinoma of the skin. It is not possible to reliably predict which spots will progress to skin cancer and so treatment is recommended to prevent development of skin cancer.  Recommend daily broad spectrum sunscreen SPF 30+ to sun-exposed areas, reapply every 2 hours as needed.  Recommend staying in the shade or wearing long sleeves, sun glasses (UVA+UVB protection) and wide brim hats (4-inch brim around the entire circumference of the hat). Call for new or changing lesions.       Due to recent  changes in healthcare laws, you may see results of your pathology and/or laboratory studies on MyChart before the doctors have had a chance to review them. We understand that in some cases there may be results that are confusing or concerning to you. Please understand that not all results are received at the same time and often the doctors may need to interpret multiple results in order to  provide you with the best plan of care or course of treatment. Therefore, we ask that you please give [place] 2 business days to thoroughly review all your results before contacting the office for clarification. Should we see a critical lab result, you will be contacted sooner.   If You Need Anything After Your Visit  If you have any questions or concerns for your doctor, please call our main line at [phone] and press option 4 to reach your doctor's medical assistant. If no one answers, please leave a voicemail as directed and we will return your call as soon as possible. Messages left after 4 pm will be answered the following business day.   You may also send [place] a message via [place]. We typically respond to MyChart messages within 1-2 business days.  For prescription refills, please ask your pharmacy to contact our office. Our fax number is [phone].  If you have an urgent issue when the clinic is closed that cannot wait until the next business day, you can page your doctor at the number below.    Please note that while we do our best to be available for urgent issues outside of office hours, we are not available 24/7.   If you have an urgent issue and are unable to reach [place], you may choose to seek medical care at your doctor's office, retail clinic, urgent care center, or emergency room.  If you have a medical emergency, please immediately call 911 or go to the emergency department.  Pager Numbers  - Dr. Nehemiah Massed: [phone]  - Dr. Laurence Ferrari: [phone]  - Dr. Nicole Kindred: [phone]  In the event of inclement weather, please call our main line at [phone] for an update on the status of any delays or closures.  Dermatology Medication Tips: Please keep the boxes that topical medications come in in order to help keep track of the instructions about where and how to use these. Pharmacies typically print the medication instructions only on the boxes and not directly on the  medication tubes.   If your medication is too expensive, please contact our office at [phone] option 4 or send [place] a message through [place].   We are unable to tell what your co-pay for medications will be in advance as this is different depending on your insurance coverage. However, we may be able to find a substitute medication at lower cost or fill out paperwork to get insurance to cover a needed medication.   If a prior authorization is required to get your medication covered by your insurance company, please allow [place] 1-2 business days to complete this process.  Drug prices often vary depending on where the prescription is filled and some pharmacies may offer cheaper prices.  The website www.goodrx.com contains coupons for medications through different pharmacies. The prices here do not account for what the cost may be with help from insurance (it may be cheaper with your insurance), but the website can give you the price if you did not use any insurance.  - You can print the associated coupon and take it with your prescription  to the pharmacy.  - You may also stop by our office during regular business hours and pick up a GoodRx coupon card.  - If you need your prescription sent electronically to a different pharmacy, notify our office through [facility] or by phone at [phone] option 4.     Si Usted Necesita Algo Despus de Su Visita  Tambin puede enviarnos un mensaje a travs de Pharmacist, community. Por lo general respondemos a los mensajes de MyChart en el transcurso de 1 a 2 das hbiles.  Para renovar recetas, por favor pida a su farmacia que se ponga en contacto con nuestra oficina. Harland Dingwall de fax es [place] [phone].  Si tiene un asunto urgente cuando la clnica est cerrada y que no puede esperar hasta el siguiente da hbil, puede llamar/localizar a su doctor(a) al nmero que aparece a continuacin.   Por favor, tenga en cuenta que aunque hacemos todo lo posible  para estar disponibles para asuntos urgentes fuera del horario de [place], no estamos disponibles las 24 horas del da, los 7 das de la [place].   Si tiene un problema urgente y no puede comunicarse con nosotros, puede optar por buscar atencin mdica  en el consultorio de su doctor(a), en una clnica privada, en un centro de atencin urgente o en una sala de emergencias.  Si tiene Engineering geologist, por favor llame inmediatamente al 911 o vaya a la sala de emergencias.  Nmeros de bper  - Dr. Nehemiah Massed: [phone]  - Dra. Moye: [phone]  - Dra. Nicole Kindred: [phone]  En caso de inclemencias del [place], por favor llame a Johnsie Kindred principal al [phone] para una actualizacin sobre el [place] de cualquier retraso o cierre.  Consejos para la medicacin en dermatologa: Por favor, guarde las cajas en las que vienen los medicamentos de uso tpico para ayudarle a seguir las instrucciones sobre dnde y cmo usarlos. Las farmacias generalmente imprimen las instrucciones del medicamento slo en las cajas y no directamente en los tubos del [place].   Si su medicamento es muy caro, por favor, pngase en contacto con Zigmund Daniel llamando al [phone] y presione la opcin 4 o envenos un mensaje a travs de Pharmacist, community.   No podemos decirle cul ser su copago por los medicamentos por adelantado ya que esto es diferente dependiendo de la cobertura de su seguro. Sin embargo, es posible que podamos encontrar un medicamento sustituto a Electrical engineer un formulario para que el seguro cubra el medicamento que se considera necesario.   Si se requiere una autorizacin previa para que su compaa de seguros [place] su medicamento, por favor permtanos de 1 a 2 das hbiles para completar este proceso.  Los precios de los medicamentos varan con frecuencia dependiendo del Environmental consultant de dnde se surte la receta y alguna farmacias pueden ofrecer precios ms baratos.  El sitio web  www.goodrx.com tiene cupones para medicamentos de Airline pilot. Los precios aqu no tienen en cuenta lo que podra costar con la ayuda del seguro (puede ser ms barato con su seguro), pero el sitio web puede darle el precio si no utiliz Research scientist (physical sciences).  - Puede imprimir el cupn correspondiente y llevarlo con su receta a la farmacia.  - Tambin puede pasar por nuestra oficina durante el horario de atencin regular y Charity fundraiser una tarjeta de cupones de GoodRx.  - Si necesita que su receta se enve electrnicamente a Chiropodist, informe a nuestra oficina a travs de MyChart de [employer] o por  telfono llamando al [phone] y presione la opcin 4.

## 2022-06-20 NOTE — Progress Notes (Unsigned)
   Follow-Up Visit   Subjective  Stephanie DUSHAJ is a 87 y.o. female who presents for the following: No chief complaint on file..  The patient has spots, moles and lesions to be evaluated, some may be new or changing and the patient has concerns that these could be cancer.   The following portions of the chart were reviewed this encounter and updated as appropriate:      Review of Systems: No other skin or systemic complaints except as noted in HPI or Assessment and Plan.   Objective  Well appearing patient in no apparent distress; mood and affect are within normal limits.  [id number] full examination was performed including scalp, head, eyes, ears, nose, lips, neck, chest, axillae, abdomen, back, buttocks, bilateral upper extremities, bilateral lower extremities, hands, feet, fingers, toes, fingernails, and toenails. All findings within normal limits unless otherwise noted below."}   Assessment & Plan  Actinic Damage with PreCancerous Actinic Keratoses Counseling for Topical Chemotherapy Management: Patient exhibits: - Severe, confluent actinic changes with pre-cancerous actinic keratoses that is secondary to cumulative UV radiation exposure over time - Condition that is severe; chronic, not at goal. - diffuse scaly erythematous macules and papules with underlying dyspigmentation - Discussed Prescription "Field Treatment" topical Chemotherapy for Severe, Chronic Confluent Actinic Changes with Pre-Cancerous Actinic Keratoses Field treatment involves treatment of an entire area of skin that has confluent Actinic Changes (Sun/ Ultraviolet light damage) and PreCancerous Actinic Keratoses by method of PhotoDynamic Therapy (PDT) and/or prescription Topical Chemotherapy agents such as 5-fluorouracil, 5-fluorouracil/calcipotriene, and/or imiquimod.  The purpose is to decrease the number of clinically evident and subclinical PreCancerous lesions to prevent progression to development of  skin cancer by chemically destroying early precancer changes that may or may not be visible.  It has been shown to reduce the risk of developing skin cancer in the treated area. As a result of treatment, redness, scaling, crusting, and open sores may occur during treatment course. One or more than one of these methods may be used and may have to be used several times to control, suppress and eliminate the PreCancerous changes. Discussed treatment course, expected reaction, and possible side effects. - Recommend daily broad spectrum sunscreen SPF 30+ to sun-exposed areas, reapply every 2 hours as needed.  - Staying in the shade or wearing long sleeves, sun glasses (UVA+UVB protection) and wide brim hats (4-inch brim around the entire circumference of the hat) are also recommended. - Call for new or changing lesions.  - Will schedule Red light photodynamic therapy to the arms and hands with debridement, if not covered by insurance will schedule blue light treatment for arms and hands  No follow-ups on file.  IRuthell Rummage, CMA, am acting as scribe for Sarina Ser, MD.

## 2022-06-22 ENCOUNTER — Encounter: Payer: Self-pay | Admitting: Dermatology

## 2022-06-30 ENCOUNTER — Other Ambulatory Visit: Payer: Self-pay | Admitting: *Deleted

## 2022-06-30 DIAGNOSIS — C50812 Malignant neoplasm of overlapping sites of left female breast: Secondary | ICD-10-CM

## 2022-06-30 DIAGNOSIS — M8580 Other specified disorders of bone density and structure, unspecified site: Secondary | ICD-10-CM

## 2022-08-03 ENCOUNTER — Ambulatory Visit: Payer: Medicare PPO

## 2022-08-17 ENCOUNTER — Ambulatory Visit: Payer: Medicare PPO | Admitting: Dermatology

## 2022-08-17 VITALS — BP 134/84 | HR 65

## 2022-08-17 DIAGNOSIS — D489 Neoplasm of uncertain behavior, unspecified: Secondary | ICD-10-CM

## 2022-08-17 DIAGNOSIS — L578 Other skin changes due to chronic exposure to nonionizing radiation: Secondary | ICD-10-CM | POA: Diagnosis not present

## 2022-08-17 DIAGNOSIS — L570 Actinic keratosis: Secondary | ICD-10-CM

## 2022-08-17 DIAGNOSIS — L821 Other seborrheic keratosis: Secondary | ICD-10-CM | POA: Diagnosis not present

## 2022-08-17 DIAGNOSIS — D0472 Carcinoma in situ of skin of left lower limb, including hip: Secondary | ICD-10-CM

## 2022-08-17 DIAGNOSIS — L565 Disseminated superficial actinic porokeratosis (DSAP): Secondary | ICD-10-CM

## 2022-08-17 DIAGNOSIS — C4492 Squamous cell carcinoma of skin, unspecified: Secondary | ICD-10-CM

## 2022-08-17 HISTORY — DX: Squamous cell carcinoma of skin, unspecified: C44.92

## 2022-08-17 NOTE — Patient Instructions (Addendum)
Instructions for [facility] Medications  One or more of your medications was sent to the [facility] mail order compounding pharmacy. You will receive an email from them and can purchase the medicine through that link. It will then be mailed to your home at the address you confirmed. If for any reason you do not receive an email from them, please check your spam folder. If you still do not find the email, please let us know. [facility] phone number is [phone].  Marland Kitchen    Biopsy Wound Care Instructions  Leave the original bandage on for 24 hours if possible.  If the bandage becomes soaked or soiled before that time, it is OK to remove it and examine the wound.  A small amount of post-operative bleeding is normal.  If excessive bleeding occurs, remove the bandage, place gauze over the site and apply continuous pressure (no peeking) over the area for 30 minutes. If this does not work, please call our clinic as soon as possible or page your doctor if it is after hours.   Once a day, cleanse the wound with soap and water. It is fine to shower. If a thick crust develops you may use a Q-tip dipped into dilute hydrogen peroxide (mix 1:1 with water) to dissolve it.  Hydrogen peroxide can slow the healing process, so use it only as needed.    After washing, apply petroleum jelly (Vaseline) or an antibiotic ointment if your doctor prescribed one for you, followed by a bandage.    For best healing, the wound should be covered with a layer of ointment at all times. If you are not able to keep the area covered with a bandage to hold the ointment in place, this may mean re-applying the ointment several times a day.  Continue this wound care until the wound has healed and is no longer open.   Itching and mild discomfort is normal during the healing process. However, if you develop pain or severe itching, please call our office.   If you have any discomfort, you can take Tylenol (acetaminophen) or  ibuprofen as directed on the bottle. (Please do not take these if you have an allergy to them or cannot take them for another reason).  Some redness, tenderness and white or yellow material in the wound is normal healing.  If the area becomes very sore and red, or develops a thick yellow-green material (pus), it may be infected; please notify us.    If you have stitches, return to clinic as directed to have the stitches removed. You will continue wound care for 2-3 days after the stitches are removed.   Wound healing continues for up to one year following surgery. It is not unusual to experience pain in the scar from time to time during the interval.  If the pain becomes severe or the scar thickens, you should notify the office.    A slight amount of redness in a scar is expected for the first six months.  After six months, the redness will fade and the scar will soften and fade.  The color difference becomes less noticeable with time.  If there are any problems, return for a post-op surgery check at your earliest convenience.  To improve the appearance of the scar, you can use silicone scar gel, cream, or sheets (such as Mederma or Serica) every night for up to one year. These are available over the counter (without a prescription).  Please call our office at [phone]  for any questions or concerns.     Electrodesiccation and Curettage ("Scrape and Burn") Wound Care Instructions  Leave the original bandage on for 24 hours if possible.  If the bandage becomes soaked or soiled before that time, it is OK to remove it and examine the wound.  A small amount of post-operative bleeding is normal.  If excessive bleeding occurs, remove the bandage, place gauze over the site and apply continuous pressure (no peeking) over the area for 30 minutes. If this does not work, please call our clinic as soon as possible or page your doctor if it is after hours.   Once a day, cleanse the wound with soap and  water. It is fine to shower. If a thick crust develops you may use a Q-tip dipped into dilute hydrogen peroxide (mix 1:1 with water) to dissolve it.  Hydrogen peroxide can slow the healing process, so use it only as needed.    After washing, apply petroleum jelly (Vaseline) or an antibiotic ointment if your doctor prescribed one for you, followed by a bandage.    For best healing, the wound should be covered with a layer of ointment at all times. If you are not able to keep the area covered with a bandage to hold the ointment in place, this may mean re-applying the ointment several times a day.  Continue this wound care until the wound has healed and is no longer open. It may take several weeks for the wound to heal and close.  Itching and mild discomfort is normal during the healing process.  If you have any discomfort, you can take Tylenol (acetaminophen) or ibuprofen as directed on the bottle. (Please do not take these if you have an allergy to them or cannot take them for another reason).  Some redness, tenderness and white or yellow material in the wound is normal healing.  If the area becomes very sore and red, or develops a thick yellow-green material (pus), it may be infected; please notify us.    Wound healing continues for up to one year following surgery. It is not unusual to experience pain in the scar from time to time during the interval.  If the pain becomes severe or the scar thickens, you should notify the office.    A slight amount of redness in a scar is expected for the first six months.  After six months, the redness will fade and the scar will soften and fade.  The color difference becomes less noticeable with time.  If there are any problems, return for a post-op surgery check at your earliest convenience.  To improve the appearance of the scar, you can use silicone scar gel, cream, or sheets (such as Mederma or Serica) every night for up to one year. These are available over  the counter (without a prescription).  Please call our office at [phone] for any questions or concerns.     Actinic keratoses are precancerous spots that appear secondary to cumulative UV radiation exposure/sun exposure over time. They are chronic with expected duration over 1 year. A portion of actinic keratoses will progress to squamous cell carcinoma of the skin. It is not possible to reliably predict which spots will progress to skin cancer and so treatment is recommended to prevent development of skin cancer.  Recommend daily broad spectrum sunscreen SPF 30+ to sun-exposed areas, reapply every 2 hours as needed.  Recommend staying in the shade or wearing long sleeves, sun glasses (UVA+UVB protection) and wide  brim hats (4-inch brim around the entire circumference of the hat). Call for new or changing lesions.   Cryotherapy Aftercare  Wash gently with soap and water everyday.   Apply Vaseline and Band-Aid daily until healed.      Due to recent changes in healthcare laws, you may see results of your pathology and/or laboratory studies on MyChart before the doctors have had a chance to review them. We understand that in some cases there may be results that are confusing or concerning to you. Please understand that not all results are received at the same time and often the doctors may need to interpret multiple results in order to provide you with the best plan of care or course of treatment. Therefore, we ask that you please give [place] 2 business days to thoroughly review all your results before contacting the office for clarification. Should we see a critical lab result, you will be contacted sooner.   If You Need Anything After Your Visit  If you have any questions or concerns for your doctor, please call our main line at [phone] and press option 4 to reach your doctor's medical assistant. If no one answers, please leave a voicemail as directed and we will return your call as  soon as possible. Messages left after 4 pm will be answered the following business day.   You may also send [place] a message via MyChart. We typically respond to MyChart messages within 1-2 business days.  For prescription refills, please ask your pharmacy to contact our office. Our fax number is [phone].  If you have an urgent issue when the clinic is closed that cannot wait until the next business day, you can page your doctor at the number below.    Please note that while we do our best to be available for urgent issues outside of office hours, we are not available 24/7.   If you have an urgent issue and are unable to reach [place], you may choose to seek medical care at your doctor's office, retail clinic, urgent care center, or emergency room.  If you have a medical emergency, please immediately call 911 or go to the emergency department.  Pager Numbers  - Dr. Gwen Pounds: [phone]  - Dr. Neale Burly: [phone]  - Dr. Roseanne Reno: [phone]  In the event of inclement weather, please call our main line at [phone] for an update on the status of any delays or closures.  Dermatology Medication Tips: Please keep the boxes that topical medications come in in order to help keep track of the instructions about where and how to use these. Pharmacies typically print the medication instructions only on the boxes and not directly on the medication tubes.   If your medication is too expensive, please contact our office at [phone] option 4 or send [place] a message through MyChart.   We are unable to tell what your co-pay for medications will be in advance as this is different depending on your insurance coverage. However, we may be able to find a substitute medication at lower cost or fill out paperwork to get insurance to cover a needed medication.   If a prior authorization is required to get your medication covered by your insurance company, please allow [place] 1-2 business days to complete this  process.  Drug prices often vary depending on where the prescription is filled and some pharmacies may offer cheaper prices.  The website www.goodrx.com contains coupons for medications through different pharmacies. The prices here do not account  for what the cost may be with help from insurance (it may be cheaper with your insurance), but the website can give you the price if you did not use any insurance.  - You can print the associated coupon and take it with your prescription to the pharmacy.  - You may also stop by our office during regular business hours and pick up a GoodRx coupon card.  - If you need your prescription sent electronically to a different pharmacy, notify our office through [facility] or by phone at [phone] option 4.     Si Usted Necesita Algo Despus de Su Visita  Tambin puede enviarnos un mensaje a travs de Clinical cytogeneticist. Por lo general respondemos a los mensajes de MyChart en el transcurso de 1 a 2 das hbiles.  Para renovar recetas, por favor pida a su farmacia que se ponga en contacto con nuestra oficina. Annie Sable de fax es [place] [phone].  Si tiene un asunto urgente cuando la clnica est cerrada y que no puede esperar hasta el siguiente da hbil, puede llamar/localizar a su doctor(a) al nmero que aparece a continuacin.   Por favor, tenga en cuenta que aunque hacemos todo lo posible para estar disponibles para asuntos urgentes fuera del horario de [place], no estamos disponibles las 24 horas del da, los 7 [address]  [address] de la [place].   Si tiene un problema urgente y no puede comunicarse con nosotros, puede optar por buscar atencin mdica  en el consultorio de su doctor(a), en una clnica privada, en un centro de atencin urgente o en una sala de emergencias.  Si tiene Engineer, drilling, por favor llame inmediatamente al 911 o vaya a la sala de emergencias.  Nmeros de bper  - Dr. Gwen Pounds: [phone]  - Dra. Moye: [phone]  - Dra.  Roseanne Reno: [phone]  En caso de inclemencias del [place], por favor llame a Lacy Duverney principal al [phone] para una actualizacin sobre el [place] de cualquier retraso o cierre.  Consejos para la medicacin en dermatologa: Por favor, guarde las cajas en las que vienen los medicamentos de uso tpico para ayudarle a seguir las instrucciones sobre dnde y cmo usarlos. Las farmacias generalmente imprimen las instrucciones del medicamento slo en las cajas y no directamente en los tubos del [place].   Si su medicamento es muy caro, por favor, pngase en contacto con Rolm Gala llamando al [phone] y presione la opcin 4 o envenos un mensaje a travs de Clinical cytogeneticist.   No podemos decirle cul ser su copago por los medicamentos por adelantado ya que esto es diferente dependiendo de la cobertura de su seguro. Sin embargo, es posible que podamos encontrar un medicamento sustituto a Audiological scientist un formulario para que el seguro cubra el medicamento que se considera necesario.   Si se requiere una autorizacin previa para que su compaa de seguros [place] su medicamento, por favor permtanos de 1 a 2 das hbiles para completar [address]  Los precios de los medicamentos varan con frecuencia dependiendo del Environmental consultant de dnde se surte la receta y alguna farmacias pueden ofrecer precios ms baratos.  El sitio web www.goodrx.com tiene cupones para medicamentos de Health and safety inspector. Los precios aqu no tienen en cuenta lo que podra costar con la ayuda del seguro (puede ser ms barato con su seguro), pero el sitio web puede darle el precio si no utiliz Tourist information centre manager.  - Puede imprimir el cupn correspondiente y llevarlo con su receta a la farmacia.  - Tambin puede  pasar por nuestra oficina durante el horario de atencin regular y Education officer, museum una tarjeta de cupones de GoodRx.  - Si necesita que su receta se enve electrnicamente a una farmacia diferente, informe a nuestra oficina a  travs de MyChart de [place] o por telfono llamando al [phone] y presione la opcin 4.

## 2022-08-17 NOTE — Progress Notes (Signed)
Follow-Up Visit   Subjective  Stephanie Crosby is a 87 y.o. female who presents for the following: Actinic keratosis  The patient has spots, moles and lesions to be evaluated, some may be new or changing and the patient has concerns that these could be cancer.   The following portions of the chart were reviewed this encounter and updated as appropriate: medications, allergies, medical history  Review of Systems:  No other skin or systemic complaints except as noted in HPI or Assessment and Plan.  Objective  Well appearing patient in no apparent distress; mood and affect are within normal limits.  A focused examination was performed of the following areas: Arms, hands, legs   Relevant exam findings are noted in the Assessment and Plan.  b/l arms, hands, and right leg x 18 (18) Erythematous thin papules/macules with gritty scale.   left lower leg 1.2 cm crusted papule          Assessment & Plan   Actinic keratosis (18) b/l arms, hands, and right leg x 18  Actinic keratoses are precancerous spots that appear secondary to cumulative UV radiation exposure/sun exposure over time. They are chronic with expected duration over 1 year. A portion of actinic keratoses will progress to squamous cell carcinoma of the skin. It is not possible to reliably predict which spots will progress to skin cancer and so treatment is recommended to prevent development of skin cancer.  Recommend daily broad spectrum sunscreen SPF 30+ to sun-exposed areas, reapply every 2 hours as needed.  Recommend staying in the shade or wearing long sleeves, sun glasses (UVA+UVB protection) and wide brim hats (4-inch brim around the entire circumference of the hat). Call for new or changing lesions.  Destruction of lesion - b/l arms, hands, and right leg x 18 Complexity: simple   Destruction method: cryotherapy   Informed consent: discussed and consent obtained   Timeout:  patient name, date of birth,  surgical site, and procedure verified Lesion destroyed using liquid nitrogen: Yes   Region frozen until ice ball extended beyond lesion: Yes   Outcome: patient tolerated procedure well with no complications   Post-procedure details: wound care instructions given    Neoplasm of uncertain behavior left lower leg  Epidermal / dermal shaving  Lesion diameter (cm):  1.2 Informed consent: discussed and consent obtained   Timeout: patient name, date of birth, surgical site, and procedure verified   Procedure prep:  Patient was prepped and draped in usual sterile fashion Prep type:  Isopropyl alcohol Anesthesia: the lesion was anesthetized in a standard fashion   Anesthetic:  1% lidocaine w/ epinephrine 1-100,000 buffered w/ 8.4% NaHCO3 Instrument used: flexible razor blade   Hemostasis achieved with: pressure, aluminum chloride and electrodesiccation   Outcome: patient tolerated procedure well   Post-procedure details: sterile dressing applied and wound care instructions given   Dressing type: bandage and petrolatum    Destruction of lesion Complexity: extensive   Destruction method: electrodesiccation and curettage   Informed consent: discussed and consent obtained   Timeout:  patient name, date of birth, surgical site, and procedure verified Procedure prep:  Patient was prepped and draped in usual sterile fashion Prep type:  Isopropyl alcohol Anesthesia: the lesion was anesthetized in a standard fashion   Anesthetic:  1% lidocaine w/ epinephrine 1-100,000 buffered w/ 8.4% NaHCO3 Curettage performed in three different directions: Yes   Electrodesiccation performed over the curetted area: Yes   Lesion length (cm):  1.2 Lesion width (cm):  1.2  Margin per side (cm):  0.2 Final wound size (cm):  1.6 Hemostasis achieved with:  pressure, aluminum chloride and electrodesiccation Outcome: patient tolerated procedure well with no complications   Post-procedure details: sterile dressing  applied and wound care instructions given   Dressing type: bandage and petrolatum    Specimen 1 - Surgical pathology Differential Diagnosis: r/o scc  Check Margins: No  R/o scc     DSAP (disseminated superficial actinic porokeratosis) Arms and Legs DSAP is a chronic inherited condition of sun-exposed skin, most commonly affecting the arms and legs.  It is difficult to treat.  Recommend photoprotection and regular use of spf 30 or higher sunscreen to prevent worsening of condition and precancerous changes.  SEBORRHEIC KERATOSIS - Stuck-on, waxy, tan-brown papules and/or plaques  - Benign-appearing - Discussed benign etiology and prognosis. - Observe - Call for any changes   ACTINIC DAMAGE - chronic, secondary to cumulative UV radiation exposure/sun exposure over time - diffuse scaly erythematous macules with underlying dyspigmentation - Recommend daily broad spectrum sunscreen SPF 30+ to sun-exposed areas, reapply every 2 hours as needed.  - Recommend staying in the shade or wearing long sleeves, sun glasses (UVA+UVB protection) and wide brim hats (4-inch brim around the entire circumference of the hat). - Call for new or changing lesions.    Return in about 6 months (around 02/16/2023) for tbse follow up on aks an dsap.  IAsher Muir, CMA, am acting as scribe for Armida Sans, MD.   Documentation: I have reviewed the above documentation for accuracy and completeness, and I agree with the above.  Armida Sans, MD

## 2022-08-18 ENCOUNTER — Ambulatory Visit
Admission: RE | Admit: 2022-08-18 | Discharge: 2022-08-18 | Disposition: A | Discharge status: Home / Self Care | Payer: Medicare PPO | Source: Ambulatory Visit | Attending: Oncology | Admitting: Oncology

## 2022-08-18 DIAGNOSIS — Z780 Asymptomatic menopausal state: Secondary | ICD-10-CM | POA: Diagnosis not present

## 2022-08-18 DIAGNOSIS — C50812 Malignant neoplasm of overlapping sites of left female breast: Secondary | ICD-10-CM | POA: Insufficient documentation

## 2022-08-18 DIAGNOSIS — Z170 Estrogen receptor positive status [ER+]: Secondary | ICD-10-CM | POA: Insufficient documentation

## 2022-08-18 DIAGNOSIS — Z1231 Encounter for screening mammogram for malignant neoplasm of breast: Secondary | ICD-10-CM | POA: Insufficient documentation

## 2022-08-18 DIAGNOSIS — M810 Age-related osteoporosis without current pathological fracture: Secondary | ICD-10-CM | POA: Diagnosis not present

## 2022-08-18 DIAGNOSIS — M8580 Other specified disorders of bone density and structure, unspecified site: Secondary | ICD-10-CM | POA: Insufficient documentation

## 2022-08-22 ENCOUNTER — Telehealth: Payer: Self-pay

## 2022-08-22 NOTE — Telephone Encounter (Signed)
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

## 2022-08-22 NOTE — Telephone Encounter (Signed)
Left voicemail to return my call

## 2022-08-24 ENCOUNTER — Inpatient Hospital Stay: Payer: Medicare PPO | Attending: Oncology | Admitting: Oncology

## 2022-08-24 ENCOUNTER — Encounter: Payer: Self-pay | Admitting: Oncology

## 2022-08-24 ENCOUNTER — Telehealth: Payer: Self-pay

## 2022-08-24 VITALS — BP 155/78 | HR 78 | Resp 18 | Ht 66.0 in | Wt 164.0 lb

## 2022-08-24 DIAGNOSIS — M810 Age-related osteoporosis without current pathological fracture: Secondary | ICD-10-CM | POA: Insufficient documentation

## 2022-08-24 DIAGNOSIS — Z9221 Personal history of antineoplastic chemotherapy: Secondary | ICD-10-CM | POA: Diagnosis not present

## 2022-08-24 DIAGNOSIS — M8580 Other specified disorders of bone density and structure, unspecified site: Secondary | ICD-10-CM | POA: Diagnosis not present

## 2022-08-24 DIAGNOSIS — Z853 Personal history of malignant neoplasm of breast: Secondary | ICD-10-CM | POA: Insufficient documentation

## 2022-08-24 DIAGNOSIS — C50812 Malignant neoplasm of overlapping sites of left female breast: Secondary | ICD-10-CM

## 2022-08-24 DIAGNOSIS — Z170 Estrogen receptor positive status [ER+]: Secondary | ICD-10-CM | POA: Diagnosis not present

## 2022-08-24 DIAGNOSIS — Z923 Personal history of irradiation: Secondary | ICD-10-CM | POA: Diagnosis not present

## 2022-08-24 MED ORDER — ALENDRONATE SODIUM 70 MG PO TABS: 70.0000 mg | ORAL_TABLET | ORAL | 0 refills | Status: DC

## 2022-08-24 NOTE — Progress Notes (Signed)
[facility]  [phone] [phone]  ID: Stephanie Crosby OB: [birth year]  MR#: [id number]  AOZ#:[id number]  Patient Care Team: Jerl Mina, MD as PCP - General (Family Medicine) Ricarda Frame, MD as Consulting Physician (General Surgery)   CHIEF COMPLAINT:   1. Clinical stage IIb ER positive, PR negative, HER-2 overexpressing invasive lobular carcinoma overlapping sites of the left breast, now pathologic stage Ia (T1c,N0,M0). 2. Clinical stage Ia ER/PR positive, HER-2 negative invasive ductal carcinoma of the upper outer quadrant of right breast, now pathologic stage 0.  INTERVAL HISTORY: Patient returns to clinic today for routine yearly evaluation and discussion of her mammogram and bone mineral density results.  She currently feels well and is asymptomatic.  She does not complain of peripheral neuropathy today.  She has no other neurologic complaints. She denies any recent fevers or illnesses.  She denies any chest pain, shortness of breath, cough, or hemoptysis.  She denies nausea, vomiting, diarrhea, or constipation. She has no urinary complaints.  Patient offers no further specific complaints today.  REVIEW OF SYSTEMS:   Review of Systems  Constitutional: Negative.  Negative for fever, malaise/fatigue and weight loss.  HENT:  Negative for sore throat.   Eyes:  Negative for discharge and redness.  Respiratory: Negative.  Negative for cough, hemoptysis and shortness of breath.   Cardiovascular: Negative.  Negative for chest pain and leg swelling.  Gastrointestinal:  Negative for abdominal pain, diarrhea, nausea and vomiting.  Genitourinary: Negative.  Negative for dysuria.  Musculoskeletal: Negative.  Negative for joint pain.  Skin: Negative.  Negative for rash.  Neurological: Negative.  Negative for sensory change, weakness and headaches.  Psychiatric/Behavioral: Negative.  The patient is not nervous/anxious and does not have insomnia.     As per HPI. Otherwise, a complete review of systems is negative.   PAST MEDICAL HISTORY: Past Medical History:  Diagnosis Date   Anemia    Back injury    Bilateral breast cancer 05/2015   Chemo tx's.   Breast cancer 2017   Breast cancer in situ 05/2015   Left   Edema of leg    GERD (gastroesophageal reflux disease)    Gonalgia    Hiatal hernia    Personal history of chemotherapy    Personal history of radiation therapy    Seasonal allergic rhinitis    Sleep apnea    Squamous cell carcinoma of skin 08/17/2022   SCCIS L lower leg - tx with ED&C   Squamous cell skin cancer 2011   resected from left side of nose and Right axilla area.    Tearing eyes    SINCE TAKING CHEMO    PAST SURGICAL HISTORY: Past Surgical History:  Procedure Laterality Date   APPENDECTOMY     AXILLARY SENTINEL NODE BIOPSY Bilateral 12/10/2015   Procedure: BILATERAL SENTINEL NODE BIOPSY, SENTINNEL NODE INJ.;  Surgeon: Ricarda Frame, MD;  Location: [facility] ORS;  Service: General;  Laterality: Bilateral;   BREAST BIOPSY Bilateral 11/17/2015   Procedure: BREAST BIOPSY WITH NEEDLE LOCALIZATION;  Surgeon: Ricarda Frame, MD;  Location: [facility] OR[facility];  Service: General;  Laterality: Bilateral;   BREAST LUMPECTOMY Bilateral 05/2015   01/17 chemo before radiation after   CATARACT EXTRACTION, BILATERAL Bilateral 2012   COLONOSCOPY     PORTACATH PLACEMENT Right 06/10/2015   Procedure: INSERTION PORT-A-CATH;  Surgeon: Ricarda Frame, MD;  Location: [facility] OR[facility];  Service: General;  Laterality: Right;    FAMILY HISTORY: Patient reports a maternal aunt with breast cancer as  well as a female cousin with breast cancer.  Family History  Problem Relation Age of Onset   Dementia Mother    Hyperlipidemia Mother    Hypertension Mother    Congestive Heart Failure Mother    Heart disease Mother    Aneurysm Father        abdominal   Fibromyalgia Sister    Heart disease Sister    Cancer Maternal Uncle        brain   Heart  disease Maternal Uncle    Pancreatic cancer Maternal Grandmother    Cancer Cousin        female cousin with breast cancer and brain cancer   Ovarian cancer Maternal Aunt    Colon cancer Maternal Aunt    Breast cancer Neg Hx        ADVANCED DIRECTIVES:    HEALTH MAINTENANCE: Social History   Tobacco Use   Smoking status: Never   Smokeless tobacco: Never  Vaping Use   Vaping Use: Never used  Substance Use Topics   Alcohol use: Not Currently    Alcohol/week: 0.0 standard drinks of alcohol    Comment: 3 glasses wine/week   Drug use: No      Allergies  Allergen Reactions   Penicillins Rash    Has patient had a PCN reaction causing immediate rash, facial/tongue/throat swelling, SOB or lightheadedness with hypotension: unsure Has patient had a PCN reaction causing severe rash involving mucus membranes or skin necrosis: no Has patient had a PCN reaction that required hospitalization no Has patient had a PCN reaction occurring within the last 10 years: no If all of the above answers are "NO", then may proceed with Cephalosporin use.    Current Outpatient Medications  Medication Sig Dispense Refill   acetaminophen (TYLENOL) 500 MG tablet Take 500 mg by mouth every 6 (six) hours as needed for mild pain.     alendronate (FOSAMAX) 70 MG tablet Take 1 tablet (70 mg total) by mouth once a week. Take with a full glass of water on an empty stomach. 12 tablet 0   azelastine (ASTELIN) 0.1 % nasal spray Place 1 spray into both nostrils 2 (two) times daily.      fluorometholone (FML) 0.1 % ophthalmic suspension SMARTSIG:In Eye(s)     losartan (COZAAR) 50 MG tablet Take 1 tablet by mouth daily.     Multiple Vitamins-Minerals (CENTRUM SILVER PO) Take 1 tablet by mouth daily. Reported on 06/15/2015     NON FORMULARY Take 1 capsule by mouth daily. Otc vitamins for macular degeneration     omeprazole (PRILOSEC) 20 MG capsule Take 20 mg by mouth daily.      valACYclovir (VALTREX) 500 MG tablet  Take 500 mg by mouth daily as needed (break outs).     calcium-vitamin D (OSCAL WITH D) 500-200 MG-UNIT tablet Take 2 tablets by mouth daily with breakfast.  (Patient not taking: Reported on 08/24/2022)     No current facility-administered medications for this visit.    OBJECTIVE: Vitals:   08/24/22 1102  BP: (!) 155/78  Pulse: 78  Resp: 18  SpO2: 99%     Body mass index is 26.47 kg/m.    ECOG FS:0 - Asymptomatic  General: Well-developed, well-nourished, no acute distress. Eyes: Pink conjunctiva, anicteric sclera. HEENT: Normocephalic, moist mucous membranes. Breast: Exam deferred today. Lungs: No audible wheezing or coughing. Heart: Regular rate and rhythm. Abdomen: Soft, nontender, no obvious distention. Musculoskeletal: No edema, cyanosis, or clubbing. Neuro: Alert, answering all questions  appropriately. Cranial nerves grossly intact. Skin: No rashes or petechiae noted. Psych: Normal affect.  LAB RESULTS:  Lab Results  Component Value Date   NA 136 01/09/2017   K 3.9 01/09/2017   CL 105 01/09/2017   CO2 24 01/09/2017   GLUCOSE 101 (H) 01/09/2017   BUN 20 01/09/2017   CREATININE 0.66 01/09/2017   CALCIUM 9.5 01/09/2017   PROT 6.9 01/09/2017   ALBUMIN 3.9 01/09/2017   AST 22 01/09/2017   ALT 18 01/09/2017   ALKPHOS 42 01/09/2017   BILITOT 1.6 (H) 01/09/2017   GFRNONAA >60 01/09/2017   GFRAA >60 01/09/2017    Lab Results  Component Value Date   WBC 4.2 01/17/2018   NEUTROABS 2.1 01/17/2018   HGB 14.2 01/17/2018   HCT 41.2 01/17/2018   MCV 92.6 01/17/2018   PLT 185 01/17/2018   Lab Results  Component Value Date   IRON 122 03/14/2016   TIBC 298 03/14/2016   IRONPCTSAT 41 (H) 03/14/2016   Lab Results  Component Value Date   FERRITIN 61 03/14/2016     STUDIES: MM 3D SCREEN BREAST BILATERAL  Result Date: 08/19/2022 CLINICAL DATA:  Screening. EXAM: DIGITAL SCREENING BILATERAL MAMMOGRAM WITH TOMOSYNTHESIS AND CAD TECHNIQUE: Bilateral screening digital  craniocaudal and mediolateral oblique mammograms were obtained. Bilateral screening digital breast tomosynthesis was performed. The images were evaluated with computer-aided detection. COMPARISON:  Previous exam(s). ACR Breast Density Category b: There are scattered areas of fibroglandular density. FINDINGS: There are no findings suspicious for malignancy. Surgical changes within both breasts again noted. IMPRESSION: No mammographic evidence of malignancy. A result letter of this screening mammogram will be mailed directly to the patient. RECOMMENDATION: Screening mammogram in one year. (Code:SM-B-01Y) BI-RADS CATEGORY  2: Benign. Electronically Signed   By: Harmon Pier M.D.   On: 08/19/2022 15:40   DG Bone Density  Result Date: 08/18/2022 EXAM: DUAL X-RAY ABSORPTIOMETRY (DXA) FOR BONE MINERAL DENSITY IMPRESSION: Your patient Stephanie Crosby completed a BMD test on 08/18/2022 using the [employer] iDXA DXA System (software version: 14.10) manufactured by [employer]. The following summarizes the results of our evaluation. Technologist: MTB PATIENT BIOGRAPHICAL: Name: Crosby, Stephanie Patient ID: [id number] Birth Date: 1935-05-25 Height: 66.0 in. Gender: Female Exam Date: 08/18/2022 Weight: 168.5 lbs. Indications: Previous Chemo and Radiation, History of Osteoporosis, High Risk Meds, Height Loss, Parent Hip Fracture, Breast CA, Caucasian, History of Fracture (Adult), Advanced Age, Postmenopausal Fractures: Right foot, coccyx, Sternum Treatments: Calcium, Vitamin D DENSITOMETRY RESULTS: Site         Region     Measured Date Measured Age WHO Classification Young Adult T-score BMD         %Change vs. Previous Significant Change (*) AP Spine L1-L3 08/18/2024 85.2 Osteopenia -2.1 0.929 g/cm2 0.4% - AP Spine L1-L3 08/16/2021 84.2 Osteopenia -2.1 0.925 g/cm2 2.7% - AP Spine L1-L3 07/21/2020 83.1 Osteopenia -2.3 0.901 g/cm2 -1.4% - AP Spine L1-L3 07/17/2019 82.1 Osteopenia -2.2 0.914 g/cm2 1.4% - AP Spine L1-L3  07/16/2018 81.1 Osteopenia -2.3 0.901 g/cm2 2.0% - AP Spine L1-L3 07/13/2017 80.1 Osteopenia -2.4 0.883 g/cm2 6.4% Yes AP Spine L1-L3 06/01/2016 79.0 Osteoporosis -2.9 0.830 g/cm2 - - DualFemur Neck Left 08/18/2022 85.2 Osteopenia -2.3 0.725 g/cm2 -9.7% Yes DualFemur Neck Left 08/16/2021 84.2 Osteopenia -1.7 0.803 g/cm2 2.7% - DualFemur Neck Left 07/21/2020 83.1 Osteopenia -1.8 0.782 g/cm2 -0.5% - DualFemur Neck Left 07/17/2019 82.1 Osteopenia -1.8 0.786 g/cm2 0.4% - DualFemur Neck Left 07/16/2018 81.1 Osteopenia -1.8 0.783 g/cm2 4.1% - DualFemur Neck Left  07/13/2017 80.1 Osteopenia -2.1 0.752 g/cm2 -0.3% - DualFemur Neck Left 06/01/2016 79.0 Osteopenia -2.0 0.754 g/cm2 - - DualFemur Total Mean 08/18/2022 85.2 Osteopenia -1.7 0.791 g/cm2 1.5% - DualFemur Total Mean 08/16/2021 84.2 Osteopenia -1.8 0.779 g/cm2 -3.0% Yes DualFemur Total Mean 07/21/2020 83.1 Osteopenia -1.6 0.803 g/cm2 2.7% Yes DualFemur Total Mean 07/17/2019 82.1 Osteopenia -1.8 0.782 g/cm2 0.8% - DualFemur Total Mean 07/16/2018 81.1 Osteopenia -1.8 0.776 g/cm2 1.3% - DualFemur Total Mean 07/13/2017 80.1 Osteopenia -1.9 0.766 g/cm2 6.2% Yes DualFemur Total Mean 06/01/2016 79.0 Osteopenia -2.3 0.721 g/cm2 - - Left Forearm Radius 33% 08/18/2022 85.2 Osteoporosis -3.2 0.592 g/cm2 - - ASSESSMENT: The BMD measured at Forearm Radius 33% is 0.592 g/cm2 with a T-score of -3.2. This patient is considered osteoporotic according to World Health Organization [facility]) criteria. The scan quality is good. L-4 was excluded due to degenerative changes. Compared with prior study, there has been no significant change in the spine. Compared with prior study, there has been no significant change in the total hip. World Science writer [facility]) criteria for post-menopausal, Caucasian Women: Normal:                   T-score at or above -1 SD Osteopenia/low bone mass: T-score between -1 and -2.5 SD Osteoporosis:             T-score at or below -2.5 SD RECOMMENDATIONS: 1. All  patients should optimize calcium and vitamin D intake. 2. Consider FDA-approved medical therapies in postmenopausal women and men aged 70 years and older, based on the following: a. A hip or vertebral(clinical or morphometric) fracture b. T-score < -2.5 at the femoral neck or spine after appropriate evaluation to exclude secondary causes c. Low bone mass (T-score between -1.0 and -2.5 at the femoral neck or spine) and a 10-year probability of a hip fracture > 3% or a 10-year probability of a major osteoporosis-related fracture > 20% based on the [place]-adapted WHO algorithm 3. Clinician judgment and/or patient preferences may indicate treatment for people with 10-year fracture probabilities above or below these levels FOLLOW-UP: People with diagnosed cases of osteoporosis or at high risk for fracture should have regular bone mineral density tests. For patients eligible for Medicare, routine testing is allowed once every 2 years. The testing frequency can be increased to one year for patients who have rapidly progressing disease, those who are receiving or discontinuing medical therapy to restore bone mass, or have additional risk factors. I have reviewed this report, and agree with the above findings. [facility] Electronically Signed   By: Romona Curls M.D.   On: 08/18/2022 13:30     ASSESSMENT:   1. Clinical stage IIb ER positive, PR negative, HER-2 overexpressing invasive lobular carcinoma overlapping sites of the left breast, now pathologic stage Ia (T1c,N0,M0). 2. Clinical stage Ia ER/PR positive, HER-2 negative invasive ductal carcinoma of the upper outer quadrant of right breast, now pathologic stage 0. 3. BRCA 1 and 2 negative.  PLAN:    Clinical stage IIb ER positive, PR negative, HER-2 overexpressing invasive lobular carcinoma overlapping sites of the left breast, pathologic stage Ia (T1c,N0,M0): Patient noted to have residual stage IA tumor and her left breast with a complete  pathologic response in her right breast. Patient completed 6 cycles of chemotherapy in June 2017 and year-long Herceptin maintenance on July 11, 2016.  Patient completed 5 years of letrozole in approximately April 2023.  Her most recent mammogram on August 18, 2022 was reported as BI-RADS 2.  Repeat in April 2025.  Follow-up in 1 year.  If everything remains stable at that point, patient can likely be discharged from clinic.   Clinical stage Ia ER/PR positive, HER-2 negative invasive ductal carcinoma of the upper outer quadrant of right breast, pathologic stage 0: Complete pathologic response. Patient has completed XRT. Osteoporosis: Patient's most recent bone mineral density on August 18, 2022 reported T-score of -3.2 which is significantly worse than 1 year prior.  Patient has been instructed to repeat initiate Fosamax, calcium, and vitamin D supplementation.  Repeat bone mineral density in April 2025 at which point treatment and monitoring can likely be taken over by primary care.  Follow-up in 1 year as above.    Patient expressed understanding and was in agreement with this plan. She also understands that She can call clinic at any time with any questions, concerns, or complaints.    Jeralyn Ruths, MD 08/24/22 12:05 PM

## 2022-08-24 NOTE — Telephone Encounter (Signed)
-----   Message from David C Kowalski, MD sent at 08/22/2022 12:41 PM EDT ----- Diagnosis Skin , left lower leg SQUAMOUS CELL CARCINOMA IN SITU, HYPERTROPHIC  Cancer = SCCis Superficial Already treated Recheck next visit 

## 2022-08-24 NOTE — Telephone Encounter (Signed)
Patient informed of pathology results 

## 2022-08-29 ENCOUNTER — Encounter: Payer: Self-pay | Admitting: Dermatology

## 2022-12-14 ENCOUNTER — Other Ambulatory Visit: Payer: Self-pay | Admitting: Oncology

## 2022-12-14 DIAGNOSIS — M8580 Other specified disorders of bone density and structure, unspecified site: Secondary | ICD-10-CM

## 2022-12-14 DIAGNOSIS — C50812 Malignant neoplasm of overlapping sites of left female breast: Secondary | ICD-10-CM

## 2022-12-14 DIAGNOSIS — Z170 Estrogen receptor positive status [ER+]: Secondary | ICD-10-CM

## 2023-02-23 ENCOUNTER — Ambulatory Visit: Payer: Medicare PPO | Admitting: Dermatology

## 2023-02-23 DIAGNOSIS — Z8589 Personal history of malignant neoplasm of other organs and systems: Secondary | ICD-10-CM

## 2023-02-23 DIAGNOSIS — D485 Neoplasm of uncertain behavior of skin: Secondary | ICD-10-CM

## 2023-02-23 DIAGNOSIS — W908XXA Exposure to other nonionizing radiation, initial encounter: Secondary | ICD-10-CM | POA: Diagnosis not present

## 2023-02-23 DIAGNOSIS — Z7189 Other specified counseling: Secondary | ICD-10-CM

## 2023-02-23 DIAGNOSIS — D492 Neoplasm of unspecified behavior of bone, soft tissue, and skin: Secondary | ICD-10-CM | POA: Diagnosis not present

## 2023-02-23 DIAGNOSIS — Z85828 Personal history of other malignant neoplasm of skin: Secondary | ICD-10-CM

## 2023-02-23 DIAGNOSIS — Z86006 Personal history of melanoma in-situ: Secondary | ICD-10-CM

## 2023-02-23 DIAGNOSIS — Z1283 Encounter for screening for malignant neoplasm of skin: Secondary | ICD-10-CM

## 2023-02-23 DIAGNOSIS — L578 Other skin changes due to chronic exposure to nonionizing radiation: Secondary | ICD-10-CM

## 2023-02-23 DIAGNOSIS — L565 Disseminated superficial actinic porokeratosis (DSAP): Secondary | ICD-10-CM | POA: Diagnosis not present

## 2023-02-23 DIAGNOSIS — L814 Other melanin hyperpigmentation: Secondary | ICD-10-CM

## 2023-02-23 DIAGNOSIS — L821 Other seborrheic keratosis: Secondary | ICD-10-CM

## 2023-02-23 DIAGNOSIS — L570 Actinic keratosis: Secondary | ICD-10-CM | POA: Diagnosis not present

## 2023-02-23 DIAGNOSIS — D229 Melanocytic nevi, unspecified: Secondary | ICD-10-CM

## 2023-02-23 DIAGNOSIS — D1801 Hemangioma of skin and subcutaneous tissue: Secondary | ICD-10-CM

## 2023-02-23 DIAGNOSIS — Z79899 Other long term (current) drug therapy: Secondary | ICD-10-CM

## 2023-02-23 NOTE — Progress Notes (Signed)
Follow-Up Visit   Subjective  Stephanie Crosby is a 87 y.o. female who presents for the following: Skin Cancer Screening and Full Body Skin Exam  The patient presents for Total-Body Skin Exam (TBSE) for skin cancer screening and mole check. The patient has spots, moles and lesions to be evaluated, some may be new or changing and the patient may have concern these could be cancer. She has a few growths on her ankles that are scaly. History of SCC in situ of the left lower leg. She has DSAP or the arms and legs. She has Cholesterol/Lovastatin cream at home, but hasn't started  yet.   The following portions of the chart were reviewed this encounter and updated as appropriate: medications, allergies, medical history  Review of Systems:  No other skin or systemic complaints except as noted in HPI or Assessment and Plan.  Objective  Well appearing patient in no apparent distress; mood and affect are within normal limits.  A full examination was performed including scalp, head, eyes, ears, nose, lips, neck, chest, axillae, abdomen, back, buttocks, bilateral upper extremities, bilateral lower extremities, hands, feet, fingers, toes, fingernails, and toenails. All findings within normal limits unless otherwise noted below.   Relevant physical exam findings are noted in the Assessment and Plan.  R nasal bridge x 1 Erythematous thin papules/macules with gritty scale.   Right ankle lateral 2.5cm pink scaly plaque     Right ankle posterior 1.5 cm pink scaly papule       Assessment & Plan   SKIN CANCER SCREENING PERFORMED TODAY.  ACTINIC DAMAGE - Chronic condition, secondary to cumulative UV/sun exposure - diffuse scaly erythematous macules with underlying dyspigmentation - Recommend daily broad spectrum sunscreen SPF 30+ to sun-exposed areas, reapply every 2 hours as needed.  - Staying in the shade or wearing long sleeves, sun glasses (UVA+UVB protection) and wide brim hats (4-inch  brim around the entire circumference of the hat) are also recommended for sun protection.  - Call for new or changing lesions.  LENTIGINES, SEBORRHEIC KERATOSES, HEMANGIOMAS - Benign normal skin lesions - Benign-appearing - Call for any changes  MELANOCYTIC NEVI - Tan-brown and/or pink-flesh-colored symmetric macules and papules - Benign appearing on exam today - Observation - Call clinic for new or changing moles - Recommend daily use of broad spectrum spf 30+ sunscreen to sun-exposed areas.   HISTORY OF SQUAMOUS CELL CARCINOMA IN SITU OF THE SKIN L lower leg, 08/17/2022 EDC - No evidence of recurrence today - Recommend regular full body skin exams - Recommend daily broad spectrum sunscreen SPF 30+ to sun-exposed areas, reapply every 2 hours as needed.  - Call if any new or changing lesions are noted between office visits  HISTORY OF SQUAMOUS CELL CARCINOMA OF THE SKIN Treated in the past.  - No evidence of recurrence today - No lymphadenopathy - Recommend regular full body skin exams - Recommend daily broad spectrum sunscreen SPF 30+ to sun-exposed areas, reapply every 2 hours as needed.  - Call if any new or changing lesions are noted between office visits  DSAP (disseminated superficial actinic porokeratosis) Arms and Legs DSAP is a chronic inherited condition of sun-exposed skin, most commonly affecting the arms and legs.  It is difficult to treat.  Recommend photoprotection and regular use of spf 30 or higher sunscreen to prevent worsening of condition and precancerous changes.  Start Cholesterol 2% Lovastatin 2% Cream 240 gm- Apply twice daily as directed to affected areas arms and legs.  Patient  has at home.    AK (actinic keratosis) R nasal bridge x 1  Actinic keratoses are precancerous spots that appear secondary to cumulative UV radiation exposure/sun exposure over time. They are chronic with expected duration over 1 year. A portion of actinic keratoses will progress  to squamous cell carcinoma of the skin. It is not possible to reliably predict which spots will progress to skin cancer and so treatment is recommended to prevent development of skin cancer.  Recommend daily broad spectrum sunscreen SPF 30+ to sun-exposed areas, reapply every 2 hours as needed.  Recommend staying in the shade or wearing long sleeves, sun glasses (UVA+UVB protection) and wide brim hats (4-inch brim around the entire circumference of the hat). Call for new or changing lesions.  Destruction of lesion - R nasal bridge x 1 Complexity: simple   Destruction method: cryotherapy   Informed consent: discussed and consent obtained   Timeout:  patient name, date of birth, surgical site, and procedure verified Lesion destroyed using liquid nitrogen: Yes   Region frozen until ice ball extended beyond lesion: Yes   Outcome: patient tolerated procedure well with no complications   Post-procedure details: wound care instructions given    Neoplasm of uncertain behavior of skin (2) Right ankle lateral  Epidermal / dermal shaving  Lesion diameter (cm):  2.5 Informed consent: discussed and consent obtained   Timeout: patient name, date of birth, surgical site, and procedure verified   Procedure prep:  Patient was prepped and draped in usual sterile fashion Prep type:  Isopropyl alcohol Anesthesia: the lesion was anesthetized in a standard fashion   Anesthetic:  1% lidocaine w/ epinephrine 1-100,000 buffered w/ 8.4% NaHCO3 Instrument used: flexible razor blade   Hemostasis achieved with: pressure, aluminum chloride and electrodesiccation   Outcome: patient tolerated procedure well   Post-procedure details: sterile dressing applied and wound care instructions given   Dressing type: bandage and petrolatum    Destruction of lesion Complexity: simple   Destruction method: electrodesiccation and curettage   Informed consent: discussed and consent obtained   Timeout:  patient name, date of  birth, surgical site, and procedure verified Anesthesia: the lesion was anesthetized in a standard fashion   Anesthetic:  1% lidocaine w/ epinephrine 1-100,000 local infiltration Curettage performed in three different directions: Yes   Electrodesiccation performed over the curetted area: Yes   Curettage cycles:  3 Lesion length (cm):  2.5 Lesion width (cm):  2.5 Margin per side (cm):  0.2 Final wound size (cm):  2.9 Hemostasis achieved with:  pressure, aluminum chloride and electrodesiccation Outcome: patient tolerated procedure well with no complications   Post-procedure details: wound care instructions given   Post-procedure details comment:  Ointment and bandage applied.  Specimen 1 - Surgical pathology Differential Diagnosis: r/o SCC Check Margins: No EDC today  Right ankle posterior  Epidermal / dermal shaving  Lesion diameter (cm):  1.5 Informed consent: discussed and consent obtained   Timeout: patient name, date of birth, surgical site, and procedure verified   Procedure prep:  Patient was prepped and draped in usual sterile fashion Prep type:  Isopropyl alcohol Anesthesia: the lesion was anesthetized in a standard fashion   Anesthetic:  1% lidocaine w/ epinephrine 1-100,000 buffered w/ 8.4% NaHCO3 Instrument used: flexible razor blade   Hemostasis achieved with: pressure, aluminum chloride and electrodesiccation   Outcome: patient tolerated procedure well   Post-procedure details: sterile dressing applied and wound care instructions given   Dressing type: bandage and petrolatum  Destruction of lesion  Destruction method: electrodesiccation and curettage   Informed consent: discussed and consent obtained   Timeout:  patient name, date of birth, surgical site, and procedure verified Anesthesia: the lesion was anesthetized in a standard fashion   Anesthetic:  1% lidocaine w/ epinephrine 1-100,000 local infiltration Curettage performed in three different directions:  Yes   Electrodesiccation performed over the curetted area: Yes   Curettage cycles:  3 Lesion length (cm):  1.5 Lesion width (cm):  1.5 Margin per side (cm):  0.2 Final wound size (cm):  1.9 Hemostasis achieved with:  pressure, aluminum chloride and electrodesiccation Outcome: patient tolerated procedure well with no complications   Post-procedure details: wound care instructions given   Post-procedure details comment:  Ointment and bandage applied.  Specimen 2 - Surgical pathology Differential Diagnosis: r/o SCC Check Margins: No EDC today   Return 6-8 months, for Hx SCC.  Wendee Beavers, CMA, am acting as scribe for Armida Sans, MD .   Documentation: I have reviewed the above documentation for accuracy and completeness, and I agree with the above.  Armida Sans, MD

## 2023-02-23 NOTE — Patient Instructions (Addendum)

## 2023-02-27 LAB — SURGICAL PATHOLOGY

## 2023-02-28 ENCOUNTER — Telehealth: Payer: Self-pay

## 2023-02-28 NOTE — Telephone Encounter (Signed)
Left voicemail to return my call

## 2023-02-28 NOTE — Telephone Encounter (Signed)
-----   Message from Armida Sans sent at 02/27/2023  5:53 PM EDT ----- FINAL DIAGNOSIS        1. Skin, right ankle lateral :       SUPERFICIAL ACTINIC POROKERATOSIS        2. Skin, right ankle posterior :       SUPERFICIAL ACTINIC POROKERATOSIS   1&2 = both benign superficial Actinic Porokeratosis No further treatment needed

## 2023-03-01 ENCOUNTER — Telehealth: Payer: Self-pay

## 2023-03-01 NOTE — Telephone Encounter (Signed)
Patient informed of pathology results 

## 2023-03-01 NOTE — Telephone Encounter (Signed)
-----   Message from Armida Sans sent at 02/27/2023  5:53 PM EDT ----- FINAL DIAGNOSIS        1. Skin, right ankle lateral :       SUPERFICIAL ACTINIC POROKERATOSIS        2. Skin, right ankle posterior :       SUPERFICIAL ACTINIC POROKERATOSIS   1&2 = both benign superficial Actinic Porokeratosis No further treatment needed

## 2023-03-11 ENCOUNTER — Other Ambulatory Visit: Payer: Self-pay | Admitting: Oncology

## 2023-03-11 ENCOUNTER — Encounter: Payer: Self-pay | Admitting: Dermatology

## 2023-03-11 DIAGNOSIS — Z170 Estrogen receptor positive status [ER+]: Secondary | ICD-10-CM

## 2023-03-11 DIAGNOSIS — M8580 Other specified disorders of bone density and structure, unspecified site: Secondary | ICD-10-CM

## 2023-05-02 ENCOUNTER — Other Ambulatory Visit: Payer: Self-pay | Admitting: *Deleted

## 2023-05-02 DIAGNOSIS — M8580 Other specified disorders of bone density and structure, unspecified site: Secondary | ICD-10-CM

## 2023-05-02 DIAGNOSIS — C50812 Malignant neoplasm of overlapping sites of left female breast: Secondary | ICD-10-CM

## 2023-06-27 ENCOUNTER — Other Ambulatory Visit: Payer: Self-pay | Admitting: Oncology

## 2023-06-27 DIAGNOSIS — M8580 Other specified disorders of bone density and structure, unspecified site: Secondary | ICD-10-CM

## 2023-06-27 DIAGNOSIS — Z170 Estrogen receptor positive status [ER+]: Secondary | ICD-10-CM

## 2023-08-25 ENCOUNTER — Ambulatory Visit
Admission: RE | Admit: 2023-08-25 | Discharge: 2023-08-25 | Disposition: A | Discharge status: Home / Self Care | Source: Ambulatory Visit | Attending: Oncology | Admitting: Oncology

## 2023-08-25 DIAGNOSIS — Z13820 Encounter for screening for osteoporosis: Secondary | ICD-10-CM | POA: Insufficient documentation

## 2023-08-25 DIAGNOSIS — M8580 Other specified disorders of bone density and structure, unspecified site: Secondary | ICD-10-CM

## 2023-08-25 DIAGNOSIS — C50812 Malignant neoplasm of overlapping sites of left female breast: Secondary | ICD-10-CM

## 2023-08-25 DIAGNOSIS — Z9889 Other specified postprocedural states: Secondary | ICD-10-CM | POA: Insufficient documentation

## 2023-08-25 DIAGNOSIS — Z1231 Encounter for screening mammogram for malignant neoplasm of breast: Secondary | ICD-10-CM | POA: Diagnosis not present

## 2023-08-25 DIAGNOSIS — Z853 Personal history of malignant neoplasm of breast: Secondary | ICD-10-CM | POA: Insufficient documentation

## 2023-08-25 DIAGNOSIS — M810 Age-related osteoporosis without current pathological fracture: Secondary | ICD-10-CM | POA: Diagnosis not present

## 2023-08-25 DIAGNOSIS — Z170 Estrogen receptor positive status [ER+]: Secondary | ICD-10-CM | POA: Diagnosis present

## 2023-08-28 ENCOUNTER — Inpatient Hospital Stay: Payer: Medicare PPO | Attending: Oncology | Admitting: Oncology

## 2023-08-28 ENCOUNTER — Encounter: Payer: Self-pay | Admitting: Oncology

## 2023-08-28 VITALS — BP 152/92 | HR 76 | Temp 98.9°F | Resp 20 | Wt 167.7 lb

## 2023-08-28 DIAGNOSIS — Z9221 Personal history of antineoplastic chemotherapy: Secondary | ICD-10-CM | POA: Insufficient documentation

## 2023-08-28 DIAGNOSIS — Z170 Estrogen receptor positive status [ER+]: Secondary | ICD-10-CM | POA: Diagnosis not present

## 2023-08-28 DIAGNOSIS — C50412 Malignant neoplasm of upper-outer quadrant of left female breast: Secondary | ICD-10-CM | POA: Insufficient documentation

## 2023-08-28 DIAGNOSIS — C50411 Malignant neoplasm of upper-outer quadrant of right female breast: Secondary | ICD-10-CM | POA: Diagnosis not present

## 2023-08-28 DIAGNOSIS — Z923 Personal history of irradiation: Secondary | ICD-10-CM | POA: Insufficient documentation

## 2023-08-28 DIAGNOSIS — C50812 Malignant neoplasm of overlapping sites of left female breast: Secondary | ICD-10-CM | POA: Diagnosis not present

## 2023-08-28 DIAGNOSIS — M810 Age-related osteoporosis without current pathological fracture: Secondary | ICD-10-CM | POA: Insufficient documentation

## 2023-08-28 DIAGNOSIS — Z853 Personal history of malignant neoplasm of breast: Secondary | ICD-10-CM | POA: Insufficient documentation

## 2023-08-28 NOTE — Progress Notes (Signed)
 [facility]  Telephone:(336) [phone] Fax:(336) [phone]  ID: Deborha Falls OB: 01-Apr-1936  MR#: [id number]  AOZ#:[id number]  Patient Care Team: Lyle San, MD as PCP - General (Family Medicine) Gwyndolyn Lerner, MD as Consulting Physician (General Surgery)   CHIEF COMPLAINT:   1. Clinical stage IIb ER positive, PR negative, HER-2 overexpressing invasive lobular carcinoma overlapping sites of the left breast, now pathologic stage Ia (T1c,N0,M0). 2. Clinical stage Ia ER/PR positive, HER-2 negative invasive ductal carcinoma of the upper outer quadrant of right breast, now pathologic stage 0.  INTERVAL HISTORY: Patient returns to clinic today for routine yearly evaluation and discussion of her mammogram and bone mineral density results.  She is experiencing grief from the recent death of her sister, but otherwise has felt well. She does not complain of peripheral neuropathy today.  She has no other neurologic complaints. She denies any recent fevers or illnesses.  She denies any chest pain, shortness of breath, cough, or hemoptysis.  She denies nausea, vomiting, diarrhea, or constipation. She has no urinary complaints.  Patient offers no further specific complaints today.  REVIEW OF SYSTEMS:   Review of Systems  Constitutional: Negative.  Negative for fever, malaise/fatigue and weight loss.  HENT:  Negative for sore throat.   Eyes:  Negative for discharge and redness.  Respiratory: Negative.  Negative for cough, hemoptysis and shortness of breath.   Cardiovascular: Negative.  Negative for chest pain and leg swelling.  Gastrointestinal:  Negative for abdominal pain, diarrhea, nausea and vomiting.  Genitourinary: Negative.  Negative for dysuria.  Musculoskeletal: Negative.  Negative for joint pain.  Skin: Negative.  Negative for rash.  Neurological: Negative.  Negative for sensory change, weakness and headaches.  Psychiatric/Behavioral: Negative.  The patient is not  nervous/anxious and does not have insomnia.    As per HPI. Otherwise, a complete review of systems is negative.   PAST MEDICAL HISTORY: Past Medical History:  Diagnosis Date   Anemia    Back injury    Bilateral breast cancer (HCC) 05/2015   Chemo tx's.   Breast cancer (HCC) 2017   Breast cancer in situ 05/2015   Left   Edema of leg    GERD (gastroesophageal reflux disease)    Gonalgia    Hiatal hernia    Personal history of chemotherapy    Personal history of radiation therapy    Seasonal allergic rhinitis    Sleep apnea    Squamous cell carcinoma of skin 08/17/2022   SCCIS L lower leg - tx with ED&C   Squamous cell skin cancer 2011   resected from left side of nose and Right axilla area.    Tearing eyes    SINCE TAKING CHEMO    PAST SURGICAL HISTORY: Past Surgical History:  Procedure Laterality Date   APPENDECTOMY     AXILLARY SENTINEL NODE BIOPSY Bilateral 12/10/2015   Procedure: BILATERAL SENTINEL NODE BIOPSY, SENTINNEL NODE INJ.;  Surgeon: Gwyndolyn Lerner, MD;  Location: [facility] OR[facility];  Service: General;  Laterality: Bilateral;   BREAST BIOPSY Bilateral 11/17/2015   Procedure: BREAST BIOPSY WITH NEEDLE LOCALIZATION;  Surgeon: Gwyndolyn Lerner, MD;  Location: [facility] OR[facility];  Service: General;  Laterality: Bilateral;   BREAST LUMPECTOMY Bilateral 05/2015   01/17 chemo before radiation after   CATARACT EXTRACTION, BILATERAL Bilateral 2012   COLONOSCOPY     PORTACATH PLACEMENT Right 06/10/2015   Procedure: INSERTION PORT-A-CATH;  Surgeon: Gwyndolyn Lerner, MD;  Location: [facility];  Service: General;  Laterality: Right;    FAMILY  HISTORY: Patient reports a maternal aunt with breast cancer as well as a female cousin with breast cancer.  Family History  Problem Relation Age of Onset   Dementia Mother    Hyperlipidemia Mother    Hypertension Mother    Congestive Heart Failure Mother    Heart disease Mother    Aneurysm Father        abdominal   Fibromyalgia Sister    Heart disease  Sister    Cancer Maternal Uncle        brain   Heart disease Maternal Uncle    Pancreatic cancer Maternal Grandmother    Cancer Cousin        female cousin with breast cancer and brain cancer   Ovarian cancer Maternal Aunt    Colon cancer Maternal Aunt    Breast cancer Neg Hx        ADVANCED DIRECTIVES:    HEALTH MAINTENANCE: Social History   Tobacco Use   Smoking status: Never   Smokeless tobacco: Never  Vaping Use   Vaping status: Never Used  Substance Use Topics   Alcohol use: Not Currently    Alcohol/week: 0.0 standard drinks of alcohol    Comment: 3 glasses wine/week   Drug use: No      Allergies  Allergen Reactions   Penicillins Rash    Has patient had a PCN reaction causing immediate rash, facial/tongue/throat swelling, SOB or lightheadedness with hypotension: unsure Has patient had a PCN reaction causing severe rash involving mucus membranes or skin necrosis: no Has patient had a PCN reaction that required hospitalization no Has patient had a PCN reaction occurring within the last 10 years: no If all of the above answers are "NO", then may proceed with Cephalosporin use.    Current Outpatient Medications  Medication Sig Dispense Refill   acetaminophen  (TYLENOL ) 500 MG tablet Take 500 mg by mouth every 6 (six) hours as needed for mild pain.     alendronate  (FOSAMAX ) 70 MG tablet TAKE 1 TABLET EVERY 7 DAYS WITH A FULL GLASS OF WATER ON AN EMPTY STOMACH DO NOT LIE DOWN FOR AT LEAST 30 MIN 12 tablet 0   azelastine (ASTELIN) 0.1 % nasal spray Place 1 spray into both nostrils 2 (two) times daily.      fluorometholone (FML) 0.1 % ophthalmic suspension SMARTSIG:In Eye(s)     losartan (COZAAR) 50 MG tablet Take 1 tablet by mouth daily.     Multiple Vitamins-Minerals (CENTRUM SILVER PO) Take 1 tablet by mouth daily. Reported on 06/15/2015     NON FORMULARY Take 1 capsule by mouth daily. Otc vitamins for macular degeneration     omeprazole (PRILOSEC) 20 MG capsule Take  20 mg by mouth daily.      valACYclovir (VALTREX) 500 MG tablet Take 500 mg by mouth daily as needed (break outs).     No current facility-administered medications for this visit.    OBJECTIVE: Vitals:   08/28/23 1304  BP: (!) 152/92  Pulse: 76  Resp: 20  Temp: 98.9 F (37.2 C)  SpO2: 100%     Body mass index is 27.07 kg/m.    ECOG FS:0 - Asymptomatic  General: Well-developed, well-nourished, no acute distress. Eyes: Pink conjunctiva, anicteric sclera. HEENT: Normocephalic, moist mucous membranes. Lungs: No audible wheezing or coughing. Heart: Regular rate and rhythm. Abdomen: Soft, nontender, no obvious distention. Musculoskeletal: No edema, cyanosis, or clubbing. Neuro: Alert, answering all questions appropriately. Cranial nerves grossly intact. Skin: No rashes or petechiae noted. Psych:  Normal affect.  LAB RESULTS:  Lab Results  Component Value Date   NA 136 01/09/2017   K 3.9 01/09/2017   CL 105 01/09/2017   CO2 24 01/09/2017   GLUCOSE 101 (H) 01/09/2017   BUN 20 01/09/2017   CREATININE 0.66 01/09/2017   CALCIUM 9.5 01/09/2017   PROT 6.9 01/09/2017   ALBUMIN 3.9 01/09/2017   AST 22 01/09/2017   ALT 18 01/09/2017   ALKPHOS 42 01/09/2017   BILITOT 1.6 (H) 01/09/2017   GFRNONAA >60 01/09/2017   GFRAA >60 01/09/2017    Lab Results  Component Value Date   WBC 4.2 01/17/2018   NEUTROABS 2.1 01/17/2018   HGB 14.2 01/17/2018   HCT 41.2 01/17/2018   MCV 92.6 01/17/2018   PLT 185 01/17/2018   Lab Results  Component Value Date   IRON 122 03/14/2016   TIBC 298 03/14/2016   IRONPCTSAT 41 (H) 03/14/2016   Lab Results  Component Value Date   FERRITIN 61 03/14/2016     STUDIES: DG Bone Density Result Date: 08/25/2023 EXAM: DUAL X-RAY ABSORPTIOMETRY (DXA) FOR BONE MINERAL DENSITY 08/25/2023 10:56 am CLINICAL DATA:  88 year old Female Postmenopausal. Screening for osteoporosis History of vertebral fracture. Patient is or has been on bone building therapies.  TECHNIQUE: An axial (e.g., hips, spine) and/or appendicular (e.g., radius) exam was performed, as appropriate, using GE Secretary/administrator at [facility]. Images are obtained for bone mineral density measurement and are not obtained for diagnostic purposes. [id number] Exclusions: L4 due to degenerative changes. COMPARISON:  08/18/2022. FINDINGS: Scan quality: Good. LUMBAR SPINE (L1-L3): BMD (in g/cm2): 0.918 T-score: -2.2 Z-score: -0.2 Rate of change from previous exam: No significant rate of change from previous exam. LEFT FEMORAL NECK: BMD (in g/cm2): 0.770 T-score: -1.9 Z-score: 0.5 LEFT TOTAL HIP: BMD (in g/cm2): 0.829 T-score: -1.4 Z-score: 0.9 RIGHT FEMORAL NECK: BMD (in g/cm2): 0.732 T-score: -2.2 Z-score: 0.2 Rate of change from previous exam: No significant rate of change from previous exam. RIGHT TOTAL HIP: BMD (in g/cm2): 0.773 T-score: -1.9 Z-score: 0.5 DUAL-FEMUR TOTAL MEAN: Rate of change from previous exam: No significant rate of change from previous exam. LEFT FOREARM (RADIUS 33%): BMD (in g/cm2): 0.578 T-score: -3.4 Z-score: -0.1 Rate of change from previous exam: No significant rate of change from previous exam. FRAX 10-YEAR PROBABILITY OF FRACTURE: FRAX not reported as the lowest BMD is not in the osteopenia range. IMPRESSION: Osteoporosis based on BMD. Fracture risk is unknown due to history of bone building therapy. RECOMMENDATIONS: 1. All patients should optimize calcium and vitamin D intake. 2. Consider FDA-approved medical therapies in postmenopausal women and men aged 55 years and older, based on the following: - A hip or vertebral (clinical or morphometric) fracture - T-score less than or equal to -2.5 and secondary causes have been excluded. - Low bone mass (T-score between -1.0 and -2.5) and a 10-year probability of a hip fracture greater than or equal to 3% or a 10-year probability of a major osteoporosis-related fracture greater than or equal to 20% based on the  [place] -adapted WHO algorithm. - Clinician judgment and/or patient preferences may indicate treatment for people with 10-year fracture probabilities above or below these levels 3. Patients with diagnosis of osteoporosis or at high risk for fracture should have regular bone mineral density tests. For patients eligible for Medicare, routine testing is allowed once every 2 years. The testing frequency can be increased to one year for patients who have rapidly progressing disease, those who  are receiving or discontinuing medical therapy to restore bone mass, or have additional risk factors. Electronically Signed   By: Sundra Engel M.D.   On: 08/25/2023 12:32     ASSESSMENT:   1. Clinical stage IIb ER positive, PR negative, HER-2 overexpressing invasive lobular carcinoma overlapping sites of the left breast, now pathologic stage Ia (T1c,N0,M0). 2. Clinical stage Ia ER/PR positive, HER-2 negative invasive ductal carcinoma of the upper outer quadrant of right breast, now pathologic stage 0. 3. BRCA 1 and 2 negative.  PLAN:    Clinical stage IIb ER positive, PR negative, HER-2 overexpressing invasive lobular carcinoma overlapping sites of the left breast, pathologic stage Ia (T1c,N0,M0): Patient noted to have residual stage IA tumor and her left breast with a complete pathologic response in her right breast. Patient completed 6 cycles of chemotherapy in June 2017 and year-long Herceptin  maintenance on July 11, 2016.  Patient completed 5 years of letrozole  in approximately April 2023.  Her most recent mammogram completed on August 25, 2023 has not yet been read by radiology.  After discussion with the patient, it was agreed upon that no further follow-up is necessary in the primary care can continue to order her yearly mammograms.  Clinical stage Ia ER/PR positive, HER-2 negative invasive ductal carcinoma of the upper outer quadrant of right breast, pathologic stage 0: Complete pathologic response. Patient has  completed XRT. Osteoporosis: Patient's most recent bone mineral density from August 25, 2023 reviewed independently with essentially unchanged osteoporosis from 1 year prior.  Continue Fosamax , calcium, and vitamin D supplementation.  Patient has been instructed to obtain further refills and repeat bone mineral density in April 2026 from primary care.  I spent a total of 20 minutes reviewing chart data, face-to-face evaluation with the patient, counseling and coordination of care as detailed above.  Patient expressed understanding and was in agreement with this plan. She also understands that She can call clinic at any time with any questions, concerns, or complaints.    Shellie Dials, MD 08/28/23 1:17 PM

## 2023-08-29 ENCOUNTER — Telehealth: Payer: Self-pay | Admitting: *Deleted

## 2023-08-29 NOTE — Telephone Encounter (Signed)
 Call placed to patient to review mammogram results, mammogram showed no abnormalities. Recommend annual screening mammogram in 1 year. Patient verbalized understanding and was appreciative of the call.

## 2023-09-27 ENCOUNTER — Other Ambulatory Visit: Payer: Self-pay | Admitting: Oncology

## 2023-09-27 DIAGNOSIS — M8580 Other specified disorders of bone density and structure, unspecified site: Secondary | ICD-10-CM

## 2023-09-27 DIAGNOSIS — C50812 Malignant neoplasm of overlapping sites of left female breast: Secondary | ICD-10-CM

## 2023-09-28 ENCOUNTER — Encounter: Payer: Self-pay | Admitting: Oncology

## 2023-10-04 ENCOUNTER — Ambulatory Visit: Payer: Medicare PPO | Admitting: Dermatology

## 2023-10-04 ENCOUNTER — Encounter: Payer: Self-pay | Admitting: Dermatology

## 2023-10-04 DIAGNOSIS — W908XXA Exposure to other nonionizing radiation, initial encounter: Secondary | ICD-10-CM | POA: Diagnosis not present

## 2023-10-04 DIAGNOSIS — Z872 Personal history of diseases of the skin and subcutaneous tissue: Secondary | ICD-10-CM

## 2023-10-04 DIAGNOSIS — Z8589 Personal history of malignant neoplasm of other organs and systems: Secondary | ICD-10-CM

## 2023-10-04 DIAGNOSIS — L570 Actinic keratosis: Secondary | ICD-10-CM | POA: Diagnosis not present

## 2023-10-04 DIAGNOSIS — L565 Disseminated superficial actinic porokeratosis (DSAP): Secondary | ICD-10-CM

## 2023-10-04 DIAGNOSIS — L578 Other skin changes due to chronic exposure to nonionizing radiation: Secondary | ICD-10-CM

## 2023-10-04 DIAGNOSIS — Z85828 Personal history of other malignant neoplasm of skin: Secondary | ICD-10-CM

## 2023-10-04 DIAGNOSIS — L821 Other seborrheic keratosis: Secondary | ICD-10-CM

## 2023-10-04 DIAGNOSIS — L820 Inflamed seborrheic keratosis: Secondary | ICD-10-CM

## 2023-10-04 NOTE — Patient Instructions (Addendum)
 Start Cholesterol 2% Lovastatin 2% Cream 240 gm- Apply twice daily as directed to affected areas arms and legs.      Recommend daily broad spectrum sunscreen SPF 30+ to sun-exposed areas, reapply every 2 hours as needed. Call for new or changing lesions.  Staying in the shade or wearing long sleeves, sun glasses (UVA+UVB protection) and wide brim hats (4-inch brim around the entire circumference of the hat) are also recommended for sun protection.     Due to recent changes in healthcare laws, you may see results of your pathology and/or laboratory studies on MyChart before the doctors have had a chance to review them. We understand that in some cases there may be results that are confusing or concerning to you. Please understand that not all results are received at the same time and often the doctors may need to interpret multiple results in order to provide you with the best plan of care or course of treatment. Therefore, we ask that you please give us  2 business days to thoroughly review all your results before contacting the office for clarification. Should we see a critical lab result, you will be contacted sooner.   If You Need Anything After Your Visit  If you have any questions or concerns for your doctor, please call our main line at [phone] and press option 4 to reach your doctor's medical assistant. If no one answers, please leave a voicemail as directed and we will return your call as soon as possible. Messages left after 4 pm will be answered the following business day.   You may also send us  a message via MyChart. We typically respond to MyChart messages within 1-2 business days.  For prescription refills, please ask your pharmacy to contact our office. Our fax number is [phone].  If you have an urgent issue when the clinic is closed that cannot wait until the next business day, you can page your doctor at the number below.    Please note that while we do our best to be  available for urgent issues outside of office hours, we are not available 24/7.   If you have an urgent issue and are unable to reach us , you may choose to seek medical care at your doctor's office, retail clinic, urgent care center, or emergency room.  If you have a medical emergency, please immediately call 911 or go to the emergency department.  Pager Numbers  - Dr. Bary Likes: [phone]  - Dr. Annette Barters: [phone]  - Dr. Felipe Horton: [phone]   In the event of inclement weather, please call our main line at [phone] for an update on the status of any delays or closures.  Dermatology Medication Tips: Please keep the boxes that topical medications come in in order to help keep track of the instructions about where and how to use these. Pharmacies typically print the medication instructions only on the boxes and not directly on the medication tubes.   If your medication is too expensive, please contact our office at [phone] option 4 or send us  a message through MyChart.   We are unable to tell what your co-pay for medications will be in advance as this is different depending on your insurance coverage. However, we may be able to find a substitute medication at lower cost or fill out paperwork to get insurance to cover a needed medication.   If a prior authorization is required to get your medication covered by your insurance company, please allow us  1-2 business days to  complete this process.  Drug prices often vary depending on where the prescription is filled and some pharmacies may offer cheaper prices.  The website www.goodrx.com contains coupons for medications through different pharmacies. The prices here do not account for what the cost may be with help from insurance (it may be cheaper with your insurance), but the website can give you the price if you did not use any insurance.  - You can print the associated coupon and take it with your prescription to the pharmacy.   - You may also stop by our office during regular business hours and pick up a GoodRx coupon card.  - If you need your prescription sent electronically to a different pharmacy, notify our office through [facility] or by phone at [phone] option 4.     Si Usted Necesita Algo Despus de Su Visita  Tambin puede enviarnos un mensaje a travs de Clinical cytogeneticist. Por lo general respondemos a los mensajes de MyChart en el transcurso de 1 a 2 das hbiles.  Para renovar recetas, por favor pida a su farmacia que se ponga en contacto con nuestra oficina. Franz Jacks de fax es [place] [phone].  Si tiene un asunto urgente cuando la clnica est cerrada y que no puede esperar hasta el siguiente da hbil, puede llamar/localizar a su doctor(a) al nmero que aparece a continuacin.   Por favor, tenga en cuenta que aunque hacemos todo lo posible para estar disponibles para asuntos urgentes fuera del horario de [place], no estamos disponibles las 24 horas del da, los 7 [address]  [address] de la [place].   Si tiene un problema urgente y no puede comunicarse con nosotros, puede optar por buscar atencin mdica  en el consultorio de su doctor(a), en una clnica privada, en un centro de atencin urgente o en una sala de emergencias.  Si tiene Engineer, drilling, por favor llame inmediatamente al 911 o vaya a la sala de emergencias.  Nmeros de bper  - Dr. Bary Likes: [phone]  - Dra. Annette Barters: [phone]  - Dr. Felipe Horton: [phone]   En caso de inclemencias del tiempo, por favor llame a Lajuan Pila principal al [phone] para una actualizacin sobre el [place] de cualquier retraso o cierre.  Consejos para la medicacin en dermatologa: Por favor, guarde las cajas en las que vienen los medicamentos de uso tpico para ayudarle a seguir las instrucciones sobre dnde y cmo usarlos. Las farmacias generalmente imprimen las instrucciones del medicamento slo en las cajas y no directamente en los tubos del  [place].   Si su medicamento es muy caro, por favor, pngase en contacto con Bettyjane Brunet llamando al [phone] y presione la opcin 4 o envenos un mensaje a travs de Clinical cytogeneticist.   No podemos decirle cul ser su copago por los medicamentos por adelantado ya que esto es diferente dependiendo de la cobertura de su seguro. Sin embargo, es posible que podamos encontrar un medicamento sustituto a Audiological scientist un formulario para que el seguro cubra el medicamento que se considera necesario.   Si se requiere una autorizacin previa para que su compaa de seguros [place] su medicamento, por favor permtanos de 1 a 2 das hbiles para completar este proceso.  Los precios de los medicamentos varan con frecuencia dependiendo del Environmental consultant de dnde se surte la receta y alguna farmacias pueden ofrecer precios ms baratos.  El sitio web www.goodrx.com tiene cupones para medicamentos de Health and safety inspector. Los precios aqu no tienen en cuenta lo que podra costar con la [employer]  del seguro (puede ser ms barato con su seguro), pero el sitio web puede darle el precio si no Visual merchandiser.  - Puede imprimir el cupn correspondiente y llevarlo con su receta a la farmacia.  - Tambin puede pasar por nuestra oficina durante el horario de atencin regular y Education officer, museum una tarjeta de cupones de GoodRx.  - Si necesita que su receta se enve electrnicamente a una farmacia diferente, informe a nuestra oficina a travs de MyChart de [place] o por telfono llamando al [phone] y presione la opcin 4.

## 2023-10-04 NOTE — Progress Notes (Signed)
 Follow-Up Visit   Subjective  Stephanie Crosby is a 88 y.o. female who presents for the following: Hx of SCCs. Hx of DSAP. Hx of AKs. Check arms, legs and face. Area of concern at right side of nose near eye.  Has not started compound cream for DSAP.   The patient has spots, moles and lesions to be evaluated, some may be new or changing and the patient may have concern these could be cancer.  The following portions of the chart were reviewed this encounter and updated as appropriate: medications, allergies, medical history  Review of Systems:  No other skin or systemic complaints except as noted in HPI or Assessment and Plan.  Objective  Well appearing patient in no apparent distress; mood and affect are within normal limits.  A focused examination was performed of the following areas: Face, arms, legs  Relevant exam findings are noted in the Assessment and Plan.  R mid to lat eyebrow, R med canthus/lower eyelid margin, L nasal root Erythematous thin papules/macules with gritty scale.  Left Forearm Erythematous keratotic or waxy stuck-on papule or plaque.  Assessment & Plan   HISTORY OF SQUAMOUS CELL CARCINOMA OF THE SKIN - No evidence of recurrence today - No lymphadenopathy - Recommend regular full body skin exams - Recommend daily broad spectrum sunscreen SPF 30+ to sun-exposed areas, reapply every 2 hours as needed.  - Call if any new or changing lesions are noted between office visits  ACTINIC DAMAGE - chronic, secondary to cumulative UV radiation exposure/sun exposure over time - diffuse scaly erythematous macules with underlying dyspigmentation - Recommend daily broad spectrum sunscreen SPF 30+ to sun-exposed areas, reapply every 2 hours as needed.  - Recommend staying in the shade or wearing long sleeves, sun glasses (UVA+UVB protection) and wide brim hats (4-inch brim around the entire circumference of the hat). - Call for new or changing lesions.   DSAP  (disseminated superficial actinic porokeratosis) Arms and Legs. Scattered pink scaly lesions DSAP is a chronic inherited condition of sun-exposed skin, most commonly affecting the arms and legs.  It is difficult to treat.  Recommend photoprotection and regular use of spf 30 or higher sunscreen to prevent worsening of condition and precancerous changes.   Start Cholesterol 2% Lovastatin 2% Cream 240 gm- Apply twice daily as directed to affected areas arms and legs.  Patient has at home.   SEBORRHEIC KERATOSIS - Stuck-on, waxy, tan-brown papules and/or plaques  - Benign-appearing - Discussed benign etiology and prognosis. - Observe - Call for any changes  AK (ACTINIC KERATOSIS) R mid to lat eyebrow, R med canthus/lower eyelid margin, L nasal root Patient deferred treatment at this time.  Has several different events coming up. Will schedule treatment for different date.  Actinic keratoses are precancerous spots that appear secondary to cumulative UV radiation exposure/sun exposure over time. They are chronic with expected duration over 1 year. A portion of actinic keratoses will progress to squamous cell carcinoma of the skin. It is not possible to reliably predict which spots will progress to skin cancer and so treatment is recommended to prevent development of skin cancer.  Recommend daily broad spectrum sunscreen SPF 30+ to sun-exposed areas, reapply every 2 hours as needed.  Recommend staying in the shade or wearing long sleeves, sun glasses (UVA+UVB protection) and wide brim hats (4-inch brim around the entire circumference of the hat). Call for new or changing lesions. INFLAMED SEBORRHEIC KERATOSIS Left Forearm Patient deferred treatment at this time.  ACTINIC  SKIN DAMAGE   HISTORY OF SQUAMOUS CELL CARCINOMA   SEBORRHEIC KERATOSIS   DSAP (DISSEMINATED SUPERFICIAL ACTINIC POROKERATOSIS)    Return for AK Follow Up/Treatment within next 5 months.  I, Jill Parcell, CMA, am  acting as scribe for Celine Collard, MD.   Documentation: I have reviewed the above documentation for accuracy and completeness, and I agree with the above.  Celine Collard, MD

## 2023-12-27 ENCOUNTER — Other Ambulatory Visit: Payer: Self-pay | Admitting: Oncology

## 2023-12-27 DIAGNOSIS — C50812 Malignant neoplasm of overlapping sites of left female breast: Secondary | ICD-10-CM

## 2023-12-27 DIAGNOSIS — M8580 Other specified disorders of bone density and structure, unspecified site: Secondary | ICD-10-CM

## 2024-02-14 ENCOUNTER — Other Ambulatory Visit: Payer: Self-pay | Admitting: Oncology

## 2024-02-14 DIAGNOSIS — M8580 Other specified disorders of bone density and structure, unspecified site: Secondary | ICD-10-CM

## 2024-02-14 DIAGNOSIS — Z170 Estrogen receptor positive status [ER+]: Secondary | ICD-10-CM

## 2024-02-20 ENCOUNTER — Encounter: Payer: Self-pay | Admitting: Dermatology

## 2024-02-20 ENCOUNTER — Ambulatory Visit: Admitting: Dermatology

## 2024-02-20 DIAGNOSIS — W908XXA Exposure to other nonionizing radiation, initial encounter: Secondary | ICD-10-CM | POA: Diagnosis not present

## 2024-02-20 DIAGNOSIS — L570 Actinic keratosis: Secondary | ICD-10-CM | POA: Diagnosis not present

## 2024-02-20 DIAGNOSIS — D229 Melanocytic nevi, unspecified: Secondary | ICD-10-CM

## 2024-02-20 DIAGNOSIS — Z7189 Other specified counseling: Secondary | ICD-10-CM

## 2024-02-20 DIAGNOSIS — L578 Other skin changes due to chronic exposure to nonionizing radiation: Secondary | ICD-10-CM | POA: Diagnosis not present

## 2024-02-20 DIAGNOSIS — D1801 Hemangioma of skin and subcutaneous tissue: Secondary | ICD-10-CM

## 2024-02-20 DIAGNOSIS — L814 Other melanin hyperpigmentation: Secondary | ICD-10-CM

## 2024-02-20 DIAGNOSIS — L565 Disseminated superficial actinic porokeratosis (DSAP): Secondary | ICD-10-CM | POA: Diagnosis not present

## 2024-02-20 DIAGNOSIS — Z1283 Encounter for screening for malignant neoplasm of skin: Secondary | ICD-10-CM | POA: Diagnosis not present

## 2024-02-20 DIAGNOSIS — L821 Other seborrheic keratosis: Secondary | ICD-10-CM

## 2024-02-20 DIAGNOSIS — Z8589 Personal history of malignant neoplasm of other organs and systems: Secondary | ICD-10-CM

## 2024-02-20 NOTE — Progress Notes (Signed)
 Follow-Up Visit   Subjective  Stephanie Crosby is a 88 y.o. female who presents for the following: Skin Cancer Screening and Full Body Skin Exam Hx of SCCs. Hx of DSAP. Hx of AKs.   The patient presents for Total-Body Skin Exam (TBSE) for skin cancer screening and mole check. The patient has spots, moles and lesions to be evaluated, some may be new or changing and the patient may have concern these could be cancer.  The following portions of the chart were reviewed this encounter and updated as appropriate: medications, allergies, medical history  Review of Systems:  No other skin or systemic complaints except as noted in HPI or Assessment and Plan.  Objective  Well appearing patient in no apparent distress; mood and affect are within normal limits.  A full examination was performed including scalp, head, eyes, ears, nose, lips, neck, chest, axillae, abdomen, back, buttocks, bilateral upper extremities, bilateral lower extremities, hands, feet, fingers, toes, fingernails, and toenails. All findings within normal limits unless otherwise noted below.   Relevant physical exam findings are noted in the Assessment and Plan.  right hand, right wrist  x 5 (5) Erythematous thin papules/macules with gritty scale.  right lateral ankle x 2..(Biopys provne)  Hypertrophic lesions.  Re-check next visit (2) Hypertropic scaly patches   Assessment & Plan   SKIN CANCER SCREENING PERFORMED TODAY.  ACTINIC DAMAGE - Chronic condition, secondary to cumulative UV/sun exposure - diffuse scaly erythematous macules with underlying dyspigmentation - Recommend daily broad spectrum sunscreen SPF 30+ to sun-exposed areas, reapply every 2 hours as needed.  - Staying in the shade or wearing long sleeves, sun glasses (UVA+UVB protection) and wide brim hats (4-inch brim around the entire circumference of the hat) are also recommended for sun protection.  - Call for new or changing lesions.  LENTIGINES,  SEBORRHEIC KERATOSES, HEMANGIOMAS - Benign normal skin lesions - Benign-appearing - Call for any changes  MELANOCYTIC NEVI - Tan-brown and/or pink-flesh-colored symmetric macules and papules - Benign appearing on exam today - Observation - Call clinic for new or changing moles - Recommend daily use of broad spectrum spf 30+ sunscreen to sun-exposed areas.   DSAP (disseminated superficial actinic porokeratosis) Arms and Legs. Scattered pink scaly lesions DSAP is a chronic inherited condition of sun-exposed skin, most commonly affecting the arms and legs.  It is difficult to treat.  Recommend photoprotection and regular use of spf 30 or higher sunscreen to prevent worsening of condition and precancerous changes.   Restart Cholesterol 2% Lovastatin 2% Cream 240 gm- Apply twice daily as directed to affected areas arms and legs.  Patient has at home.     AK (ACTINIC KERATOSIS) R mid to lat eyebrow, R med canthus/lower eyelid margin, L nasal root Patient deferred treatment at this time.  She understands these can turn into cancer but does not want anything treated near her eyes. Actinic keratoses are precancerous spots that appear secondary to cumulative UV radiation exposure/sun exposure over time. They are chronic with expected duration over 1 year. A portion of actinic keratoses will progress to squamous cell carcinoma of the skin. It is not possible to reliably predict which spots will progress to skin cancer and so treatment is recommended to prevent development of skin cancer.   Recommend daily broad spectrum sunscreen SPF 30+ to sun-exposed areas, reapply every 2 hours as needed.  Recommend staying in the shade or wearing long sleeves, sun glasses (UVA+UVB protection) and wide brim hats (4-inch brim around the entire  circumference of the hat). Call for new or changing lesions.   HISTORY OF SQUAMOUS CELL CARCINOMA OF THE SKIN - No evidence of recurrence today - No lymphadenopathy -  Recommend regular full body skin exams - Recommend daily broad spectrum sunscreen SPF 30+ to sun-exposed areas, reapply every 2 hours as needed.  - Call if any new or changing lesions are noted between office visits  AK (ACTINIC KERATOSIS) (5) right hand, right wrist  x 5 (5) Destruction of lesion - right hand, right wrist  x 5 (5) Complexity: simple   Destruction method: cryotherapy   Informed consent: discussed and consent obtained   Timeout:  patient name, date of birth, surgical site, and procedure verified Lesion destroyed using liquid nitrogen: Yes   Region frozen until ice ball extended beyond lesion: Yes   Outcome: patient tolerated procedure well with no complications   Post-procedure details: wound care instructions given    DSAP (DISSEMINATED SUPERFICIAL ACTINIC POROKERATOSIS) (2) right lateral ankle x 2..([employer] provne)  Hypertrophic lesions.  Re-check next visit (2) Destruction of lesion - right lateral ankle x 2..(Bi[employer] provne)  Hypertrophic lesions.  Re-check next visit (2) Complexity: simple   Destruction method: cryotherapy   Informed consent: discussed and consent obtained   Timeout:  patient name, date of birth, surgical site, and procedure verified Lesion destroyed using liquid nitrogen: Yes   Region frozen until ice ball extended beyond lesion: Yes   Outcome: patient tolerated procedure well with no complications   Post-procedure details: wound care instructions given      Return in about 6 months (around 08/20/2024) for DSAP, AKs .  I, Fay Kirks, CMA, am acting as scribe for Alm Rhyme, MD .   Documentation: I have reviewed the above documentation for accuracy and completeness, and I agree with the above.  Alm Rhyme, MD

## 2024-02-20 NOTE — Patient Instructions (Addendum)

## 2024-08-20 ENCOUNTER — Ambulatory Visit: Admitting: Dermatology
